# Patient Record
Sex: Male | Born: 1985 | Race: White
Health system: Midwestern US, Academic
[De-identification: ages and names within clinical notes are randomized; demographics above are authoritative.]

## PROBLEM LIST (undated history)

## (undated) DIAGNOSIS — F4310 Post-traumatic stress disorder, unspecified: Secondary | ICD-10-CM

## (undated) DIAGNOSIS — M25561 Pain in right knee: Secondary | ICD-10-CM

## (undated) DIAGNOSIS — F410 Panic disorder [episodic paroxysmal anxiety] without agoraphobia: Secondary | ICD-10-CM

## (undated) DIAGNOSIS — F329 Major depressive disorder, single episode, unspecified: Secondary | ICD-10-CM

## (undated) DIAGNOSIS — G43909 Migraine, unspecified, not intractable, without status migrainosus: Secondary | ICD-10-CM

## (undated) DIAGNOSIS — F32A Depression, unspecified: Secondary | ICD-10-CM

## (undated) DIAGNOSIS — S069XAA Unspecified intracranial injury with loss of consciousness status unknown, initial encounter: Secondary | ICD-10-CM

## (undated) DIAGNOSIS — F419 Anxiety disorder, unspecified: Secondary | ICD-10-CM

## (undated) DIAGNOSIS — M549 Dorsalgia, unspecified: Secondary | ICD-10-CM

## (undated) DIAGNOSIS — S069X9A Unspecified intracranial injury with loss of consciousness of unspecified duration, initial encounter: Secondary | ICD-10-CM

## (undated) HISTORY — DX: Post-traumatic stress disorder, unspecified: F43.10

## (undated) HISTORY — DX: Unspecified intracranial injury with loss of consciousness status unknown, initial encounter: S06.9XAA

## (undated) HISTORY — DX: Panic disorder (episodic paroxysmal anxiety): F41.0

## (undated) HISTORY — DX: Dorsalgia, unspecified: M54.9

## (undated) HISTORY — DX: Major depressive disorder, single episode, unspecified: F32.9

## (undated) HISTORY — DX: Migraine, unspecified, not intractable, without status migrainosus: G43.909

## (undated) HISTORY — PX: OTHER SURGICAL HISTORY: SHX169

## (undated) HISTORY — DX: Depression, unspecified: F32.A

## (undated) HISTORY — DX: Pain in right knee: M25.561

## (undated) HISTORY — DX: Unspecified intracranial injury with loss of consciousness of unspecified duration, initial encounter: S06.9X9A

## (undated) HISTORY — DX: Anxiety disorder, unspecified: F41.9

## (undated) MED FILL — ATOGEPANT 60 MG TABLET: 60 60 mg | ORAL | 30 days supply | Qty: 30 | Fill #1

---

## 2013-06-30 ENCOUNTER — Ambulatory Visit (INDEPENDENT_AMBULATORY_CARE_PROVIDER_SITE_OTHER): Payer: Non-veteran care | Admitting: Neurology

## 2013-06-30 ENCOUNTER — Encounter: Payer: Self-pay | Admitting: Neurology

## 2013-06-30 ENCOUNTER — Encounter (INDEPENDENT_AMBULATORY_CARE_PROVIDER_SITE_OTHER): Payer: Self-pay

## 2013-06-30 VITALS — BP 144/84 | HR 96 | Ht 76.5 in | Wt 218.0 lb

## 2013-06-30 DIAGNOSIS — G43709 Chronic migraine without aura, not intractable, without status migrainosus: Secondary | ICD-10-CM | POA: Insufficient documentation

## 2013-06-30 DIAGNOSIS — S069X9A Unspecified intracranial injury with loss of consciousness of unspecified duration, initial encounter: Secondary | ICD-10-CM

## 2013-06-30 DIAGNOSIS — R2681 Unsteadiness on feet: Secondary | ICD-10-CM

## 2013-06-30 DIAGNOSIS — R269 Unspecified abnormalities of gait and mobility: Secondary | ICD-10-CM

## 2013-06-30 DIAGNOSIS — G43009 Migraine without aura, not intractable, without status migrainosus: Secondary | ICD-10-CM

## 2013-06-30 DIAGNOSIS — S069XAA Unspecified intracranial injury with loss of consciousness status unknown, initial encounter: Secondary | ICD-10-CM

## 2013-06-30 MED ORDER — VERAPAMIL HCL ER 240 MG PO TBCR: 240.0000 mg | EXTENDED_RELEASE_TABLET | Freq: Every day | ORAL | Status: DC

## 2013-06-30 NOTE — Patient Instructions (Signed)
Overall you are doing fairly well but I do want to suggest a few things today:   Remember to drink plenty of fluid, eat healthy meals and do not skip any meals. Try to eat protein with a every meal and eat a healthy snack such as fruit or nuts in between meals. Try to keep a regular sleep-wake schedule and try to exercise daily, particularly in the form of walking, 20-30 minutes a day, if you can.   As far as your medications are concerned, I would like to suggest trying Botox therapy for your migraines. I will place a request for this and we will contact you once it is approved.   I would like you to see neuro-rehab and undergo vestibular rehab. You will be called to schedule this.   Please call us with any interim questions, concerns, problems, updates or refill requests.   My clinical assistant and will answer any of your questions and relay your messages to me and also relay most of my messages to you.   Our phone number is [phone]. We also have an after hours call service for urgent matters and there is a physician on-call for urgent questions. For any emergencies you know to call 911 or go to the nearest emergency room
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
## 2013-07-09 ENCOUNTER — Telehealth: Payer: Self-pay | Admitting: Neurology

## 2013-07-09 ENCOUNTER — Ambulatory Visit: Payer: Non-veteran care | Admitting: Physical Therapy

## 2013-07-09 NOTE — Telephone Encounter (Signed)
Spoke to patient and relayed that his referral for Vestibular rehab has to go to [facility] first.  They will either contact us or contact the patient for approval of the vestibular rehabilitation.

## 2013-08-08 ENCOUNTER — Emergency Department: Payer: Self-pay | Admitting: Emergency Medicine

## 2013-08-12 ENCOUNTER — Ambulatory Visit (INDEPENDENT_AMBULATORY_CARE_PROVIDER_SITE_OTHER): Payer: Non-veteran care | Admitting: Neurology

## 2013-08-12 ENCOUNTER — Encounter: Payer: Self-pay | Admitting: *Deleted

## 2013-08-12 ENCOUNTER — Encounter: Payer: Self-pay | Admitting: Neurology

## 2013-08-12 VITALS — BP 143/87 | HR 80 | Ht 76.5 in | Wt 215.0 lb

## 2013-08-12 DIAGNOSIS — G43719 Chronic migraine without aura, intractable, without status migrainosus: Secondary | ICD-10-CM

## 2013-08-12 DIAGNOSIS — G43709 Chronic migraine without aura, not intractable, without status migrainosus: Secondary | ICD-10-CM

## 2013-08-12 DIAGNOSIS — IMO0002 Reserved for concepts with insufficient information to code with codable children: Secondary | ICD-10-CM

## 2013-08-12 MED ORDER — MAGNESIUM OXIDE 400 MG PO TABS: 400.0000 mg | ORAL_TABLET | Freq: Every day | ORAL | Status: DC

## 2013-08-12 MED ORDER — ONABOTULINUMTOXINA 100 UNITS IJ SOLR: 200.0000 [IU] | Freq: Once | INTRAMUSCULAR | Status: DC

## 2013-08-12 NOTE — Progress Notes (Signed)
[facility]   Provider:  [name] Referring Provider: No ref. provider found Primary Care Physician:  No primary provider on file.  CC:  Chronic migraine  HPI:  Ethan Sawyer is a 28 y.o. male here as an initial visit for Botox injections for chronic migraines. Reports continued headaches, has been severe. Has been given dilaudid, promethazine and then toradol.    Prior visit 06/2013: Ethan Sawyer is a 28 y.o. male [facility] for migraine evaluation. He reports having had Botox in the past but has not had it in several months. 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 Has not been able to have it done since being discharged. Reports he has had migraines for the past 3 to 4 years. Has a daily chronic migraine and >15 times a month will have severe migraine exacerbations. Describes a generalized headache, pounding type headache. + Nausea, emesis, photo and phonophobia. + Blurry vision. Headaches last 6 hours to all day. + Dizziness and light headed.    Contraindications and precautions discussed with patient. Aseptic procedure was observed and patient tolerated procedure. Procedure performed by Dr. Elspeth ChoPeter Damaso Laday.  The condition has existed for more than 6 months, and pt does not have a diagnosis of ALS, Myasthenia Gravis or Lambert-Eaton Syndrome.  Risks and benefits of injections discussed and pt agrees to proceed with the procedure.  Written consent obtained These injections are medically necessary. He receives good benefits from these injections. These injections do not cause sedations or hallucinations which the oral therapies may cause.  Indication/Diagnosis: chronic migraine  Type of toxin: Botox  Lot # [id number]  Expiration date:Oct 2017  Injection sites:  Muscle Site    L   R  Corrugator    5   5  Procerus         5  Frontalis    5x2   5x2   Temporalis    5x4   5x4    Occipitalis    5x3   5x3  Trapezius    5x3   5x3  Cervical paraspinals  5x2   5x2  History   Social History  . Marital Status: Married    Spouse Name: shauna    Number of Children: 3  . Years of Education: college   Occupational History  . [employer]    Social History Main Topics  . Smoking status: Current Some Day Smoker  . Smokeless tobacco: Not on file  . Alcohol Use: No  . Drug Use: No  . Sexual Activity: Not on file   Other Topics Concern  . Not on file   Social History Narrative   Married, 3 children   Right handed   College   3-4 cups daily    Family History  Problem Relation Age of Onset  . Breast cancer Mother     Past Medical History  Diagnosis Date  . Migraine   . Depression   . Anxiety   . Back pain   . Right knee pain   . Panic disorder   . PTSD (post-traumatic stress disorder)   . TBI (traumatic brain injury)     Past Surgical History  Procedure Laterality Date  . None      Current Outpatient Prescriptions  Medication Sig Dispense Refill  . verapamil (CALAN-SR) 240 MG CR tablet Take 1 tablet (240 mg total) by mouth at bedtime.  90 tablet  3   No current facility-administered medications for this visit.    Allergies as of 08/12/2013  . (No Known Allergies)    Vitals: BP 143/87  Pulse 80  Ht 6' 4.5" (1.943 m)  Wt 215 lb (97.523 kg)  BMI 25.83 kg/m2 Last Weight:  Wt Readings from Last 1 Encounters:  08/12/13 215 lb (97.523 kg)   Last Height:   Ht Readings from Last 1 Encounters:  08/12/13 6' 4.5" (1.943 m)      Assessment:  After physical and neurologic examination, review of laboratory studies, imaging, neurophysiology testing and pre-existing records, assessment will be reviewed on the problem list.  Plan:  Treatment plan and additional workup will be reviewed under Problem List.  Ethan Sawyer is a 28 y.o. male here for botox injections for  chronic migraine headache  1) Botox injections as detailed above. A total of 155 units was used. Please order 200 units for next visit 2) Tylenol or Motrin for injection site pain. 3) Medication guide dispensed. 4) Follow up for repeat injections in 3 months    Elspeth ChoPeter Shianne Zeiser, DO  [facility] [address] [place], [place] [id number]  Phone [phone] Fax [phone]

## 2013-08-12 NOTE — Patient Instructions (Signed)
Overall you are doing fairly well but I do want to suggest a few things today:   Remember to drink plenty of fluid, eat healthy meals and do not skip any meals. Try to eat protein with a every meal and eat a healthy snack such as fruit or nuts in between meals. Try to keep a regular sleep-wake schedule and try to exercise daily, particularly in the form of walking, 20-30 minutes a day, if you can.   As far as your medications are concerned, I would like to suggest you continue on the Verapamil and then add Magnesium 400mg  daily  We will repeat injections in 3 months, you will be called to schedule this. Please call us with any interim questions, concerns, problems, updates or refill requests.   My clinical assistant and will answer any of your questions and relay your messages to me and also relay most of my messages to you.   Our phone number is [phone]. We also have an after hours call service for urgent matters and there is a physician on-call for urgent questions. For any emergencies you know to call 911 or go to the nearest emergency room

## 2013-09-17 ENCOUNTER — Encounter (HOSPITAL_COMMUNITY): Payer: Self-pay | Admitting: Emergency Medicine

## 2013-09-17 DIAGNOSIS — F4310 Post-traumatic stress disorder, unspecified: Secondary | ICD-10-CM | POA: Insufficient documentation

## 2013-09-17 DIAGNOSIS — Z113 Encounter for screening for infections with a predominantly sexual mode of transmission: Secondary | ICD-10-CM | POA: Insufficient documentation

## 2013-09-17 DIAGNOSIS — Z87820 Personal history of traumatic brain injury: Secondary | ICD-10-CM | POA: Insufficient documentation

## 2013-09-17 DIAGNOSIS — G479 Sleep disorder, unspecified: Secondary | ICD-10-CM | POA: Insufficient documentation

## 2013-09-17 DIAGNOSIS — F411 Generalized anxiety disorder: Secondary | ICD-10-CM | POA: Insufficient documentation

## 2013-09-17 DIAGNOSIS — Z79899 Other long term (current) drug therapy: Secondary | ICD-10-CM | POA: Insufficient documentation

## 2013-09-17 DIAGNOSIS — T7421XA Adult sexual abuse, confirmed, initial encounter: Secondary | ICD-10-CM | POA: Insufficient documentation
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
## 2013-09-18 ENCOUNTER — Encounter: Payer: Self-pay | Admitting: Neurology

## 2013-09-18 ENCOUNTER — Emergency Department (HOSPITAL_COMMUNITY)
Admission: EM | Admit: 2013-09-18 | Discharge: 2013-09-18 | Disposition: A | Discharge status: Home / Self Care | Attending: Emergency Medicine | Admitting: Emergency Medicine

## 2013-09-18 DIAGNOSIS — IMO0002 Reserved for concepts with insufficient information to code with codable children: Secondary | ICD-10-CM

## 2013-09-18 DIAGNOSIS — F4310 Post-traumatic stress disorder, unspecified: Secondary | ICD-10-CM

## 2013-09-18 LAB — RPR

## 2013-09-18 LAB — HIV ANTIBODY (ROUTINE TESTING W REFLEX): HIV 1&2 Ab, 4th Generation: NONREACTIVE

## 2013-09-18 MED ORDER — PROMETHAZINE HCL 25 MG PO TABS
ORAL_TABLET | ORAL | Status: AC
  Filled 2013-09-18: qty 3

## 2013-09-18 MED ORDER — LEVONORGESTREL 0.75 MG PO TABS
ORAL_TABLET | ORAL | Status: AC
  Filled 2013-09-18: qty 2

## 2013-09-18 MED ORDER — LORAZEPAM 1 MG PO TABS
1.0000 mg | ORAL_TABLET | Freq: Once | ORAL | Status: AC
  Administered 2013-09-18: 1 mg via ORAL
  Filled 2013-09-18: qty 1

## 2013-09-18 MED ORDER — AZITHROMYCIN 1 G PO PACK
PACK | ORAL | Status: AC
  Administered 2013-09-18: 1 g
  Filled 2013-09-18: qty 1

## 2013-09-18 MED ORDER — LORAZEPAM 1 MG PO TABS: 1.0000 mg | ORAL_TABLET | Freq: Two times a day (BID) | ORAL | Status: DC | PRN

## 2013-09-18 MED ORDER — CEFIXIME 400 MG PO TABS
ORAL_TABLET | ORAL | Status: AC
  Administered 2013-09-18: 400 mg
  Filled 2013-09-18: qty 1

## 2013-09-18 MED ORDER — METRONIDAZOLE 500 MG PO TABS
ORAL_TABLET | ORAL | Status: AC
  Administered 2013-09-18: 2000 mg
  Filled 2013-09-18: qty 4

## 2013-09-18 NOTE — ED Notes (Signed)
[place]  [employer] notified at request of patient and wife.

## 2013-09-18 NOTE — ED Notes (Signed)
Sane RN in with patient

## 2013-09-18 NOTE — Discharge Instructions (Signed)
Sexual Assault or Rape °Sexual assault is any sexual activity that a person is forced, threatened, or coerced into participating in. It may or may not involve physical contact. You are being sexually abused if you are forced to have sexual contact of any kind. Sexual assault is called rape if penetration has occurred (vaginal, oral, or anal). Many times, sexual assaults are committed by a friend, relative, or associate. Sexual assault and rape are never the victim's fault.  °Sexual assault can result in various health problems for the person who was assaulted. Some of these problems include: °· Physical injuries in the genital area or other areas of the body. °· Risk of unwanted pregnancy. °· Risk of sexually transmitted infections (STIs). °· Psychological problems such as anxiety, depression, or posttraumatic stress disorder. °WHAT STEPS SHOULD BE TAKEN AFTER A SEXUAL ASSAULT? °If you have been sexually assaulted, you should take the following steps as soon as possible: °· Go to a safe area as quickly as possible and call your local emergency services (911 in [place] Get away from the area where you have been attacked.   °· Do not wash, shower, comb your hair, or clean any part of your body.   °· Do not change your clothes.   °· Do not remove or touch anything in the area where you were assaulted.   °· Go to an emergency room for a complete physical exam. Get the necessary tests to protect yourself from STIs or pregnancy. You may be treated for an STI even if no signs of one are present. Emergency contraceptive medicines are also available to help prevent pregnancy, if this is desired. You may need to be examined by a specially trained health care provider. °· Have the health care provider collect evidence during the exam, even if you are not sure if you will file a report with the police. °· Find out how to file the correct papers with the authorities. This is important for all assaults, even if they were committed  by a family member or friend. °· Find out where you can get additional help and support, such as a local rape crisis center. °· Follow up with your health care provider as directed.   °HOW CAN YOU REDUCE THE CHANCES OF SEXUAL ASSAULT? °Take the following steps to help reduce your chances of being sexually assaulted: °· Consider carrying mace or pepper spray for protection against an attacker.   °· Consider taking a self-defense course. °· Do not try to fight off an attacker if he or she has a gun or knife.   °· Be aware of your surroundings, what is happening around you, and who might be there.   °· Be assertive, trust your instincts, and walk with confidence and direction. °· Be careful not to drink too much alcohol or use other intoxicants. These can reduce your ability to fight off an assault. °· Always lock your doors and windows. Be sure to have high-quality locks for your home.   °· Do not let people enter your house if you do not know them.   °· Get a home security system that has a siren if you are able.   °· Protect the keys to your house and car. Do not lend them out. Do not put your name and address on them. If you lose them, get your locks changed.   °· Always lock your car and have your key ready to open the door before approaching the car.   °· Park in a well-lit and busy area. °· Plan your driving routes   so that you travel on well-lit and frequently used streets.  Keep your car serviced. Always have at least half a tank of gas in it.   Do not go into isolated areas alone. This includes open garages, empty buildings or offices, or [employer] laundry rooms.   Do not walk or jog alone, especially when it is dark.   Never hitchhike.   If your car breaks down, call the police for help on your cell phone and stay inside the car with your doors locked and windows up.   If you are being followed, go to a busy area and call for help.   If you are stopped by a police officer, especially one in  an unmarked police car, keep your door locked. Do not put your window down all the way. Ask the officer to show you identification first.   Be aware of "date rape drugs" that can be placed in a drink when you are not looking. These drugs can make you unable to fight off an assault. FOR MORE INFORMATION  Office on [employer], [place]. Department of Health and Human Services: SecretaryNews.cawww.womenshealth.gov/violence-against-women/types-of-violence/sexual-assault-and-abuse.html  National Sexual Assault Hotline: 1-800-656-HOPE 272 800 2092(4673)  National Domestic Violence Hotline: 1-800-799-SAFE 682 288 3083(7233) or www.thehotline.org Document Released: 03/17/2000 Document Revised: 11/20/2012 Document Reviewed: 08/21/2012 [facility] Patient Information 2015 [employer], [place]. This information is not intended to replace advice given to you by your health care provider. Make sure you discuss any questions you have with your health care provider.  Post-traumatic Stress You have post-traumatic stress disorder (PTSD). This condition causes many different symptoms including: emotional outbursts, anxiety, sleeping problems, social withdrawal, and drug abuse. PTSD often follows a particularly traumatic event such as war, or natural disasters like hurricanes, earthquakes, or floods. It can also be seen after personal traumas such as accidents, rape, or the death of someone you love. Symptoms may be delayed for days or even years. Emotional numbing and the inability to feel your emotions, may be the earliest sign. Periods of agitation, aggression, and inability to perform ordinary tasks are common with PTSD. Nightmares and daytime memories of the trauma often bring on uncontrolled symptoms. Sufferers typically startle easily and avoid reminders of the trauma. Panic attacks, feelings of extreme guilt, and blackouts are often reported. Treatment is very helpful, especially group therapy. Healing happens when emotional traumas are shared with others who  have a sympathetic ear. The [facility] have helped over 185,000 veterans with this problem. Medication is also very effective. The symptoms can become chronic and lifelong, so it is important to get help. Call your caregiver or a counselor who deals with this type of problem for further assistance. Document Released: 04/27/2004 Document Revised: 06/12/2011 Document Reviewed: 03/20/2005 [facility] Patient Information 2015 [place], [place]. This information is not intended to replace advice given to you by your health care provider. Make sure you discuss any questions you have with your health care provider.
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
## 2013-09-20 LAB — GC/CHLAMYDIA PROBE AMP
CT Probe RNA: NEGATIVE
GC Probe RNA: NEGATIVE

## 2013-10-28 NOTE — SANE Note (Signed)
ON Monday, September 22, 2013, AN EMAIL REFERRAL FOR COUNSELING SERVICES WAS SENT Ethan Sawyer AT [facility].
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
## 2013-11-11 ENCOUNTER — Ambulatory Visit: Admitting: Neurology

## 2013-11-18 ENCOUNTER — Telehealth: Payer: Self-pay | Admitting: Neurology
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
## 2013-11-18 NOTE — Telephone Encounter (Signed)
Called and spoke to patient he has appt with Dr.Sumner 11-24-2013.

## 2013-11-24 ENCOUNTER — Ambulatory Visit (INDEPENDENT_AMBULATORY_CARE_PROVIDER_SITE_OTHER): Admitting: Neurology

## 2013-11-24 ENCOUNTER — Encounter: Payer: Self-pay | Admitting: Neurology

## 2013-11-24 VITALS — BP 128/79 | HR 78 | Ht 76.5 in | Wt 214.0 lb

## 2013-11-24 DIAGNOSIS — Z5189 Encounter for other specified aftercare: Secondary | ICD-10-CM

## 2013-11-24 DIAGNOSIS — R2681 Unsteadiness on feet: Secondary | ICD-10-CM

## 2013-11-24 DIAGNOSIS — R269 Unspecified abnormalities of gait and mobility: Secondary | ICD-10-CM

## 2013-11-24 DIAGNOSIS — IMO0002 Reserved for concepts with insufficient information to code with codable children: Secondary | ICD-10-CM

## 2013-11-24 DIAGNOSIS — G43709 Chronic migraine without aura, not intractable, without status migrainosus: Secondary | ICD-10-CM

## 2013-11-24 DIAGNOSIS — S069X0D Unspecified intracranial injury without loss of consciousness, subsequent encounter: Secondary | ICD-10-CM

## 2013-11-24 MED ORDER — METHYLPREDNISOLONE (PAK) 4 MG PO TABS: ORAL_TABLET | ORAL | Status: DC

## 2013-11-24 NOTE — Patient Instructions (Addendum)
Overall you are doing fairly well but I do want to suggest a few things today:   Remember to drink plenty of fluid, eat healthy meals and do not skip any meals. Try to eat protein with a every meal and eat a healthy snack such as fruit or nuts in between meals. Try to keep a regular sleep-wake schedule and try to exercise daily, particularly in the form of walking, 20-30 minutes a day, if you can.   As far as your medications are concerned, I would like to suggest the following: 1)I would like you to try a medrol steroid taper pack  I would like you to remain out of work for 7 more days. Please call me on 8/31 to update me on your status.   Please schedule repeat Botox injections if desired.   Please call us with any interim questions, concerns, problems, updates or refill requests.   My clinical assistant and will answer any of your questions and relay your messages to me and also relay most of my messages to you.   Our phone number is [phone]. We also have an after hours call service for urgent matters and there is a physician on-call for urgent questions. For any emergencies you know to call 911 or go to the nearest emergency room
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
## 2013-12-01 ENCOUNTER — Telehealth: Payer: Self-pay | Admitting: Neurology

## 2013-12-01 NOTE — Telephone Encounter (Signed)
Spoke to patient and he relayed he is returning a call to doctor, from follow up appointment on 11-24-13.

## 2013-12-03 NOTE — Telephone Encounter (Signed)
Returned call. No answer. Message left for patient to call back.  

## 2013-12-11 ENCOUNTER — Telehealth: Payer: Self-pay | Admitting: Neurology

## 2013-12-11 NOTE — Telephone Encounter (Signed)
Received call from patient, he is continuing to have a refractory headache. He is scheduled for Botox injections later this month. Will have him come in for a depacon infusion. Will notify RN to call patient to schedule this.

## 2013-12-12 NOTE — Telephone Encounter (Signed)
Spoke to patient and he will call back to see if he can come in this afternoon.  I relayed we would like him in no later than 1500.

## 2013-12-25 ENCOUNTER — Ambulatory Visit: Admitting: Neurology

## 2013-12-25 ENCOUNTER — Ambulatory Visit (INDEPENDENT_AMBULATORY_CARE_PROVIDER_SITE_OTHER): Payer: Non-veteran care | Admitting: Neurology

## 2013-12-25 ENCOUNTER — Encounter (INDEPENDENT_AMBULATORY_CARE_PROVIDER_SITE_OTHER): Payer: Self-pay

## 2013-12-25 ENCOUNTER — Encounter: Payer: Self-pay | Admitting: Neurology

## 2013-12-25 VITALS — BP 130/76 | HR 92 | Ht 76.0 in | Wt 215.0 lb

## 2013-12-25 DIAGNOSIS — G43709 Chronic migraine without aura, not intractable, without status migrainosus: Secondary | ICD-10-CM

## 2013-12-25 DIAGNOSIS — G43719 Chronic migraine without aura, intractable, without status migrainosus: Secondary | ICD-10-CM

## 2013-12-25 DIAGNOSIS — IMO0002 Reserved for concepts with insufficient information to code with codable children: Secondary | ICD-10-CM

## 2013-12-25 MED ORDER — ONABOTULINUMTOXINA 100 UNITS IJ SOLR
200.0000 [IU] | Freq: Once | INTRAMUSCULAR | Status: DC
Start: 2013-12-25 — End: 2014-12-15

## 2013-12-25 MED ORDER — MECLIZINE HCL 12.5 MG PO TABS: 12.5000 mg | ORAL_TABLET | Freq: Three times a day (TID) | ORAL | Status: DC | PRN
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
## 2014-03-24 ENCOUNTER — Ambulatory Visit (INDEPENDENT_AMBULATORY_CARE_PROVIDER_SITE_OTHER): Payer: Non-veteran care | Admitting: Neurology

## 2014-03-24 ENCOUNTER — Encounter: Payer: Self-pay | Admitting: Neurology

## 2014-03-24 VITALS — BP 113/73 | HR 84 | Ht 76.5 in | Wt 207.2 lb

## 2014-03-24 DIAGNOSIS — G43719 Chronic migraine without aura, intractable, without status migrainosus: Secondary | ICD-10-CM | POA: Diagnosis not present

## 2014-03-24 DIAGNOSIS — G43711 Chronic migraine without aura, intractable, with status migrainosus: Secondary | ICD-10-CM
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
## 2014-04-29 ENCOUNTER — Ambulatory Visit (INDEPENDENT_AMBULATORY_CARE_PROVIDER_SITE_OTHER): Payer: Non-veteran care | Admitting: Neurology

## 2014-04-29 ENCOUNTER — Encounter: Payer: Self-pay | Admitting: Neurology

## 2014-04-29 VITALS — BP 140/85 | HR 101 | Ht 76.5 in | Wt 209.0 lb

## 2014-04-29 DIAGNOSIS — R569 Unspecified convulsions: Secondary | ICD-10-CM

## 2014-04-29 MED ORDER — LAMOTRIGINE 25 MG PO TABS: ORAL_TABLET | ORAL | Status: DC
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
## 2014-04-29 NOTE — Patient Instructions (Addendum)
Overall you are doing fairly well but I do want to suggest a few things today:   Remember to drink plenty of fluid, eat healthy meals and do not skip any meals. Try to eat protein with a every meal and eat a healthy snack such as fruit or nuts in between meals. Try to keep a regular sleep-wake schedule and try to exercise daily, particularly in the form of walking, 20-30 minutes a day, if you can.   As far as your medications are concerned, I would like to suggest:  First  2 weeks: 25mg  daily (1 pill)  Weeks 3 and 4: 50mg  daily (2 pills)  Weeks 5 and 6: 50mg  twice daily (2 pills twice daily)   As far as diagnostic testing: mri of the brain w/wo contrast, EEG, Discussed extended eeg at a later time  I would like to see you back in 4 weeks, sooner if we need to. Please call us with any interim questions, concerns, problems, updates or refill requests.   Please also call us for any test results so we can go over those with you on the phone.  My clinical assistant and will answer any of your questions and relay your messages to me and also relay most of my messages to you.   Our phone number is [phone]. We also have an after hours call service for urgent matters and there is a physician on-call for urgent questions. For any emergencies you know to call 911 or go to the nearest emergency room

## 2014-04-30 DIAGNOSIS — R569 Unspecified convulsions: Secondary | ICD-10-CM | POA: Insufficient documentation

## 2014-05-05 ENCOUNTER — Ambulatory Visit (INDEPENDENT_AMBULATORY_CARE_PROVIDER_SITE_OTHER): Payer: Non-veteran care | Admitting: Neurology

## 2014-05-05 ENCOUNTER — Telehealth: Payer: Self-pay | Admitting: *Deleted

## 2014-05-05 DIAGNOSIS — R569 Unspecified convulsions: Secondary | ICD-10-CM

## 2014-05-05 NOTE — Telephone Encounter (Signed)
Talked with patient about normal EEG results. Verbalized understanding.
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
## 2014-05-05 NOTE — Telephone Encounter (Signed)
-----   Message from Anson FretAntonia B Ahern, MD sent at 05/05/2014  3:21 PM EST ----- Please let patient know his EEG was normal. Thank you

## 2014-05-29 ENCOUNTER — Emergency Department (HOSPITAL_COMMUNITY)

## 2014-05-29 ENCOUNTER — Encounter (HOSPITAL_COMMUNITY): Payer: Self-pay | Admitting: *Deleted

## 2014-05-29 ENCOUNTER — Emergency Department (HOSPITAL_COMMUNITY)
Admission: EM | Admit: 2014-05-29 | Discharge: 2014-05-30 | Disposition: A | Discharge status: Home / Self Care | Attending: Emergency Medicine | Admitting: Emergency Medicine

## 2014-05-29 DIAGNOSIS — Z87820 Personal history of traumatic brain injury: Secondary | ICD-10-CM | POA: Insufficient documentation

## 2014-05-29 DIAGNOSIS — Z8739 Personal history of other diseases of the musculoskeletal system and connective tissue: Secondary | ICD-10-CM | POA: Diagnosis not present

## 2014-05-29 DIAGNOSIS — F4312 Post-traumatic stress disorder, chronic: Secondary | ICD-10-CM | POA: Diagnosis not present

## 2014-05-29 DIAGNOSIS — Z720 Tobacco use: Secondary | ICD-10-CM | POA: Insufficient documentation

## 2014-05-29 DIAGNOSIS — R51 Headache: Secondary | ICD-10-CM | POA: Diagnosis not present

## 2014-05-29 DIAGNOSIS — F410 Panic disorder [episodic paroxysmal anxiety] without agoraphobia: Secondary | ICD-10-CM | POA: Insufficient documentation

## 2014-05-29 DIAGNOSIS — F329 Major depressive disorder, single episode, unspecified: Secondary | ICD-10-CM | POA: Insufficient documentation

## 2014-05-29 DIAGNOSIS — Z79899 Other long term (current) drug therapy: Secondary | ICD-10-CM | POA: Diagnosis not present

## 2014-05-29 DIAGNOSIS — G43909 Migraine, unspecified, not intractable, without status migrainosus: Secondary | ICD-10-CM | POA: Diagnosis not present

## 2014-05-29 DIAGNOSIS — G43009 Migraine without aura, not intractable, without status migrainosus: Secondary | ICD-10-CM

## 2014-05-29 LAB — I-STAT CHEM 8, ED
BUN: 11 mg/dL (ref 6–23)
CREATININE: 0.9 mg/dL (ref 0.50–1.35)
Calcium, Ion: 1.15 mmol/L (ref 1.12–1.23)
Chloride: 104 mmol/L (ref 96–112)
Glucose, Bld: 86 mg/dL (ref 70–99)
HEMATOCRIT: 49 % (ref 39.0–52.0)
HEMOGLOBIN: 16.7 g/dL (ref 13.0–17.0)
POTASSIUM: 4 mmol/L (ref 3.5–5.1)
Sodium: 141 mmol/L (ref 135–145)
TCO2: 22 mmol/L (ref 0–100)

## 2014-05-29 MED ORDER — VALPROATE SODIUM 500 MG/5ML IV SOLN
500.0000 mg | Freq: Once | INTRAVENOUS | Status: AC
  Administered 2014-05-29: 500 mg via INTRAVENOUS
  Filled 2014-05-29: qty 5

## 2014-05-29 MED ORDER — SODIUM CHLORIDE 0.9 % IV BOLUS (SEPSIS)
1000.0000 mL | Freq: Once | INTRAVENOUS | Status: AC
  Administered 2014-05-29: 1000 mL via INTRAVENOUS

## 2014-05-29 MED ORDER — HYDROMORPHONE HCL 1 MG/ML IJ SOLN
1.0000 mg | Freq: Once | INTRAMUSCULAR | Status: AC
  Administered 2014-05-29: 1 mg via INTRAVENOUS
  Filled 2014-05-29: qty 1

## 2014-05-29 MED ORDER — DIPHENHYDRAMINE HCL 50 MG/ML IJ SOLN
25.0000 mg | Freq: Once | INTRAMUSCULAR | Status: AC
  Administered 2014-05-29: 25 mg via INTRAVENOUS
  Filled 2014-05-29: qty 1

## 2014-05-29 MED ORDER — KETOROLAC TROMETHAMINE 30 MG/ML IJ SOLN
30.0000 mg | Freq: Once | INTRAMUSCULAR | Status: AC
  Administered 2014-05-29: 30 mg via INTRAVENOUS
  Filled 2014-05-29: qty 1

## 2014-05-29 MED ORDER — MAGNESIUM SULFATE 2 GM/50ML IV SOLN
2.0000 g | Freq: Once | INTRAVENOUS | Status: AC
  Administered 2014-05-29: 2 g via INTRAVENOUS
  Filled 2014-05-29: qty 50

## 2014-05-29 MED ORDER — FENTANYL CITRATE 0.05 MG/ML IJ SOLN
50.0000 ug | Freq: Once | INTRAMUSCULAR | Status: AC
  Administered 2014-05-29: 50 ug via INTRAVENOUS
  Filled 2014-05-29: qty 2

## 2014-05-29 MED ORDER — ACETAMINOPHEN 500 MG PO TABS
1000.0000 mg | ORAL_TABLET | Freq: Once | ORAL | Status: AC
  Administered 2014-05-29: 1000 mg via ORAL
  Filled 2014-05-29: qty 2

## 2014-05-29 MED ORDER — VALPROATE SODIUM 500 MG/5ML IV SOLN: 500.0000 mg | Freq: Once | INTRAVENOUS | Status: DC

## 2014-05-29 MED ORDER — METOCLOPRAMIDE HCL 5 MG/ML IJ SOLN
10.0000 mg | Freq: Once | INTRAMUSCULAR | Status: AC
  Administered 2014-05-29: 10 mg via INTRAVENOUS
  Filled 2014-05-29: qty 2

## 2014-05-29 MED ORDER — DEXAMETHASONE SODIUM PHOSPHATE 10 MG/ML IJ SOLN
10.0000 mg | Freq: Once | INTRAMUSCULAR | Status: AC
  Administered 2014-05-29: 10 mg via INTRAVENOUS
  Filled 2014-05-29: qty 1

## 2014-05-29 NOTE — ED Provider Notes (Signed)
CSN: [id number]     Arrival date & time 05/29/14  1554 History   First MD Initiated Contact with Patient 05/29/14 1615     Chief Complaint  Patient presents with  . 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 The patient states he was last normal at approximately 4 AM this morning when he went to sleep. He awoke and had a dull 5 on a 10 right-sided headache with associated tingling sensation as well as subjective decreased sensation in his right arm and leg. The headache has persisted and gradually worsened and is now 8 out of 10 in severity. His numbness has persisted. He denies any associated dysarthria, dysphasia or difficulty swallowing. He denies any associated right-sided weakness. He denies any recent head trauma and does not believe he had a seizure. Of note, the patient states his headache is slightly different from his usual migraine and his never had right-sided numbness with his prior headaches in the past. He denies any recent medication changes and has been taking his Lamictal as prescribed. No recent fevers or chills. No photophobia. Aggravating factors include bright light and loud noises. He denies any alleviating factors.  Past Medical History  Diagnosis Date  . Migraine   . Depression   . Anxiety   . Back pain   . Right knee pain   . Panic disorder   . PTSD (post-traumatic stress disorder)   . TBI (traumatic brain injury)    Past Surgical History  Procedure Laterality Date  . None     Family History  Problem Relation Age of Onset  . Breast cancer Mother    History  Substance Use Topics  . Smoking status: Current Every Day Smoker -- 0.75 packs/day    Types:  Cigarettes  . Smokeless tobacco: Never Used  . Alcohol Use: No     Comment: Quit : 98yrs ago    Review of Systems  Constitutional: Negative for fever and chills.  HENT: Negative for congestion, rhinorrhea and sore throat.   Eyes: Negative for visual disturbance.  Respiratory: Negative for cough, shortness of breath and wheezing.   Cardiovascular: Negative for chest pain.  Gastrointestinal: Negative for nausea, vomiting, abdominal pain and diarrhea.  Musculoskeletal: Negative for gait problem and neck pain.  Skin: Negative for rash.  Neurological: Positive for numbness and headaches. Negative for dizziness, seizures, syncope and speech difficulty.      Allergies  Review of patient's allergies indicates no known allergies.  Home Medications   Prior to Admission medications   Medication Sig Start Date End Date Taking? Authorizing Provider  clonazePAM (KLONOPIN) 1 MG tablet Take 1 mg by mouth 2 (two) times daily as needed for anxiety.    Yes Historical Provider, MD  lamoTRIgine (LAMICTAL) 25 MG tablet First  2 weeks:  daily (1 pill); Weeks 3 and 4:  daily (2 pills); Weeks 5 and 6:  twice daily (2 pills twice daily) 04/29/14  Yes Anson Fret, MD  magnesium oxide (MAG-OX) 400 MG tablet Take 1 tablet (400 mg total) by mouth daily. 08/12/13  Yes Omelia Blackwater, DO  meclizine (ANTIVERT) 12.5 MG tablet Take 1 tablet (12.5 mg total) by mouth 3 (three) times daily as needed for dizziness. 12/25/13  Yes Omelia Blackwater,  DO  pantoprazole (PROTONIX) 40 MG tablet Take 40 mg by mouth daily.   Yes Historical Provider, MD  promethazine (PHENERGAN) 25 MG tablet Take 25 mg by mouth every 6 (six) hours as needed for nausea or vomiting.   Yes Historical Provider, MD  sertraline (ZOLOFT) 100 MG tablet Take 200 mg by mouth daily.    Yes Historical Provider, MD  SUMAtriptan (IMITREX) 50 MG tablet Take 50 mg by mouth every 2 (two) hours as needed for migraine or headache. May repeat in 2  hours if headache persists or recurs.   Yes Historical Provider, MD  tiZANidine (ZANAFLEX) 4 MG tablet Take 4 mg by mouth 3 (three) times daily. Taking 3 times a day as needed   Yes Historical Provider, MD  verapamil (CALAN-SR) 240 MG CR tablet Take 1 tablet (240 mg total) by mouth at bedtime. Patient taking differently: Take 240 mg by mouth daily.  06/30/13  Yes Omelia BlackwaterPeter Justin Sumner, DO   BP 129/68 mmHg  Pulse 78  Temp(Src) 98.3 F (36.8 C) (Oral)  Resp 24  Ht 6\' 4"  (1.93 m)  Wt 215 lb (97.523 kg)  BMI 26.18 kg/m2  SpO2 96% Physical Exam  Constitutional: He appears well-developed and well-nourished. No distress.  HENT:  Head: Normocephalic and atraumatic.  Mouth/Throat: No oropharyngeal exudate.  Eyes: Conjunctivae are normal. Pupils are equal, round, and reactive to light.  Neck: Normal range of motion. Neck supple.  Cardiovascular: Normal rate, normal heart sounds and intact distal pulses.  Exam reveals no friction rub.   No murmur heard. Pulmonary/Chest: Effort normal and breath sounds normal. No respiratory distress. He has no wheezes.  Abdominal: Soft. Bowel sounds are normal. He exhibits no distension. There is no tenderness.  Musculoskeletal: He exhibits no edema.  Skin: Skin is warm. No rash noted.  Nursing note and vitals reviewed.   Neurological Exam:  - Mental Status: Alert and oriented to person, place, and time. Attention and concentration normal. Speech clear. Recent memory is intact. - Cranial Nerves: Visual fields intact to confrontation in all quadrants bilaterally. EOMI and PERRLA. No nystagmus noted. Facial sensation intact at forehead, maxillary cheek, and chin/mandible bilaterally. No weakness of masticatory muscles. No facial asymmetry or weakness. Hearing grossly normal to finer rub. Uvula is midline, and palate elevates symmetrically. Normal SCM and trapezius strength. Tongue midline without fasciculations - Motor: Muscle strength 5/5 in proximal and distal UE  and LE bilaterally. No pronator drift. Muscle tone normal. - Reflexes: 2+ and symmetrical in all four extremities.  - Sensation: Subjectively diminished to light touch and pinrick in right upper and lower extremities distally. - Gait: Normal without ataxia. - Coordination: Normal FTN and HTS bilaterally.  ED Course  Procedures (including critical care time) Labs Review Labs Reviewed  I-STAT CHEM 8, ED    Imaging Review Mr Brain Wo Contrast  05/29/2014   CLINICAL DATA:  Headache. Right-sided numbness and tingling beginning this morning at 4 a.m.  EXAM: MRI HEAD WITHOUT CONTRAST  TECHNIQUE: Multiplanar, multiecho pulse sequences of the brain and surrounding structures were obtained without intravenous contrast.  COMPARISON:  CT head without contrast 08/08/2013.  FINDINGS: Diffusion-weighted images demonstrate no evidence for acute or subacute infarction. No hemorrhage or mass lesion is evident. Ventricles are of normal size. No significant extraaxial fluid collection is present.  Flow is present in the major intracranial arteries. The globes and orbits are intact. The skullbase is within normal limits. Midline structures are unremarkable.  Dedicated imaging of the temporal lobes  demonstrate symmetric size and signal of the hippocampal structures.  Mild mucosal thickening is scattered in the anterior ethmoid air cells bilaterally. There is mild mucosal thickening in the right frontal sinus and bilateral maxillary sinuses. The mastoid air cells are clear.  IMPRESSION: 1. Normal MRI appearance of the brain. 2. Mild sinus disease as described. 3. No acute or focal lesion to explain the patient's symptoms.   Electronically Signed   By: Marin Roberts M.D.   On: 05/29/2014 18:12     EKG Interpretation None      MDM   Final diagnoses:  Atypical migraine    29 year old male with past medical history of TBI and reported "spiral, seizure disorder," as well as chronic daily atypical migraines  who presents with right-sided headache as well as subjectively decreased sensation of the right upper and lower extremities. The headache began gradually and has progressively worsened. See history of present illness above. On arrival, temp 98.2, vital signs stable and within normal limits. Exam as above. Remarkable for subjectively decreased sensation of the right upper and lower extremity, but over otherwise no focal neurological deficits.  Patient's presentation is most consistent with likely atypical complex migraine. However, the patient states that while the headache is similar to his usual migraines. The associated numbness is new. Discussed with neurology recommends MRI. Will treat with migraine cocktail and IV fluids. Otherwise, patient is afebrile with no neck stiffness, recent illnesses, or red flags for meningitis or encephalitis. The pain began gradually, was not merely severe at onset, and is similar to his usual migraines and I do not suspect subarachnoid hemorrhage. No recent seizure activity per his or his wife's report. No recent head trauma. Will follow up MRI and reassess.  MRI shows no acute abnormalities. I formally consult to neurology and they've evaluated the patient. They suspect this is secondary to atypical migraine with aura and recommend management of headache with discharge once improved. Chem 8 panel unremarkable. Patient states headache is mildly improved after IV fluids, Reglan, Benadryl, Toradol, and fentanyl. He states that Dilaudid is usually all that works for his headaches. Given the persistence of his headache as well as his history of refractory migraines, will give Depacon as well as Dilaudid and continued additional fluid bolus.  Headache is now 3-4/10 in severity. He states this is his baseline severity and his right-sided numbness has resolved. Will subsequently discharge with outpatient follow-up  Clinical Impression: 1. Atypical migraine     Disposition:  Admit  Condition: Stable  Pt seen in conjunction with Dr. Bethann Punches, MD 05/30/14 1610  Juliet Rude. Rubin Payor, MD 05/30/14 1504

## 2014-05-29 NOTE — ED Notes (Signed)
Pt in from home c/o HA & R sided numbness & tingling new onset, LSN this am @ 4am, pt reports falling asleep & waking up with symptoms @ 13:00, pt hx of TBI in 2011 x 4, pt sees Dr. Hosie PoissonSumner @ [facility], pt [facility] [place], pt reports hx of spiral seizures takes Lamictal, pt A&O x4, upon arrival to ED, pt reports bil arm numbness, pt c/o chronic HA, pt c/o light sensitivity

## 2014-05-29 NOTE — ED Notes (Signed)
Patient transported to MRI 

## 2014-05-29 NOTE — ED Notes (Signed)
O2 saturation drops to 88 when pt asleep, pt placed on 2L [place] O2 saturation 94%.

## 2014-05-29 NOTE — Consult Note (Signed)
NEURO HOSPITALIST CONSULT NOTE    Reason for Consult: HA with new onset right arm-leg paresthesias  HPI:                                                                                                                                          Ethan Sawyer is an 29 y.o. male with a past medical history significant for TBI x 5 with subsequent development of migraine, depression, anxiety, PTSD, and seizures on lamictal,  comes in for further evaluation of the above stated symptoms. He indicated that he gets very frequent migraines but never had a migraine accompanied by any other symptoms. However, he woke up this morning with a severe HA and immediately started having numbness-tingling and discomfort of the right arm and leg. Denies associated vertigo, double vision, focal weakness, imbalance, slurred speech, language or vision impairment. No bladder or bowel impairment. No recent fever, infection, vaccination, foreign travel, head/neck trauma. I personally reviewed MRI brain done today and it showed no acute intracranial abnormality or other lesion that could explain his symptoms. Received IV fluids, Ketorolac, Depacon, Reglan, Benadryl, decadron, and magnesium without significant HA improvement.  Past Medical History  Diagnosis Date  . Migraine   . Depression   . Anxiety   . Back pain   . Right knee pain   . Panic disorder   . PTSD (post-traumatic stress disorder)   . TBI (traumatic brain injury)     Past Surgical History  Procedure Laterality Date  . None      Family History  Problem Relation Age of Onset  . Breast cancer Mother     Family History: no MS, brain tumor, epilepsy, or brain aneurysms.   Social History:  reports that he has been smoking Cigarettes.  He has been smoking about 0.75 packs per day. He has never used smokeless tobacco. He reports that he does not drink alcohol or use illicit drugs.  No Known Allergies  MEDICATIONS:                                                                                                                      I have reviewed the patient's current medications.   ROS:  History obtained from the patient  General ROS: negative for - chills, fatigue, fever, night sweats, weight gain or weight loss Psychological ROS: negative for - behavioral disorder, hallucinations, memory difficulties Ophthalmic ROS: negative for - blurry vision, double vision, eye pain or loss of vision ENT ROS: negative for - epistaxis, nasal discharge, oral lesions, sore throat, tinnitus or vertigo Allergy and Immunology ROS: negative for - hives or itchy/watery eyes Hematological and Lymphatic ROS: negative for - bleeding problems, bruising or swollen lymph nodes Endocrine ROS: negative for - galactorrhea, hair pattern changes, polydipsia/polyuria or temperature intolerance Respiratory ROS: negative for - cough, hemoptysis, shortness of breath or wheezing Cardiovascular ROS: negative for - chest pain, dyspnea on exertion, edema or irregular heartbeat Gastrointestinal ROS: negative for - abdominal pain, diarrhea, hematemesis, nausea/vomiting or stool incontinence Genito-Urinary ROS: negative for - dysuria, hematuria, incontinence or urinary frequency/urgency Musculoskeletal ROS: negative for - joint swelling or muscular weakness Neurological ROS: as noted in HPI Dermatological ROS: negative for rash and skin lesion changes  Physical exam: pleasant male in no apparent distress. Blood pressure 112/57, pulse 54, temperature 98.3 F (36.8 C), temperature source Oral, resp. rate 14, height 6\' 4"  (1.93 m), weight 97.523 kg (215 lb), SpO2 95 %. Head: normocephalic. Neck: supple, no bruits, no JVD. Cardiac: no murmurs. Lungs: clear. Abdomen: soft, no tender, no mass. Extremities: no  edema. Skin: no rash Neurologic Examination:                                                                                                      General: Mental Status: Alert, oriented, thought content appropriate.  Speech fluent without evidence of aphasia.  Able to follow 3 step commands without difficulty. Cranial Nerves: II: Discs flat bilaterally; Visual fields grossly normal, pupils equal, round, reactive to light and accommodation III,IV, VI: ptosis not present, extra-ocular motions intact bilaterally V,VII: smile symmetric, facial light touch sensation normal bilaterally VIII: hearing normal bilaterally IX,X: gag reflex present XI: bilateral shoulder shrug XII: midline tongue extension without atrophy or fasciculations  Motor: Right : Upper extremity   5/5    Left:     Upper extremity   5/5  Lower extremity   5/5     Lower extremity   5/5 Tone and bulk:normal tone throughout; no atrophy noted Sensory: Pinprick and light touch slightly diminished in the right arm-leg Deep Tendon Reflexes:  Right: Upper Extremity   Left: Upper extremity   biceps (C-5 to C-6) 2/4   biceps (C-5 to C-6) 2/4 tricep (C7) 2/4    triceps (C7) 2/4 Brachioradialis (C6) 2/4  Brachioradialis (C6) 2/4  Lower Extremity Lower Extremity  quadriceps (L-2 to L-4) 2/4   quadriceps (L-2 to L-4) 2/4 Achilles (S1) 2/4   Achilles (S1) 2/4  Plantars: Right: downgoing   Left: downgoing Cerebellar: normal finger-to-nose,  normal heel-to-shin test Gait:  Deferred due to multiple leads.    No results found for: CHOL  Results for orders placed or performed during the hospital encounter of 05/29/14 (from the past 48 hour(s))  I-Stat Chem 8, ED  Status: None   Collection Time: 05/29/14  8:09 PM  Result Value Ref Range   Sodium 141 135 - 145 mmol/L   Potassium 4.0 3.5 - 5.1 mmol/L   Chloride 104 96 - 112 mmol/L   BUN 11 6 - 23 mg/dL   Creatinine, Ser 0.45 0.50 - 1.35 mg/dL   Glucose, Bld 86 70 - 99  mg/dL   Calcium, Ion 4.09 8.11 - 1.23 mmol/L   TCO2 22 0 - 100 mmol/L   Hemoglobin 16.7 13.0 - 17.0 g/dL   HCT 91.4 78.2 - 95.6 %    Mr Brain Wo Contrast  05/29/2014   CLINICAL DATA:  Headache. Right-sided numbness and tingling beginning this morning at 4 a.m.  EXAM: MRI HEAD WITHOUT CONTRAST  TECHNIQUE: Multiplanar, multiecho pulse sequences of the brain and surrounding structures were obtained without intravenous contrast.  COMPARISON:  CT head without contrast 08/08/2013.  FINDINGS: Diffusion-weighted images demonstrate no evidence for acute or subacute infarction. No hemorrhage or mass lesion is evident. Ventricles are of normal size. No significant extraaxial fluid collection is present.  Flow is present in the major intracranial arteries. The globes and orbits are intact. The skullbase is within normal limits. Midline structures are unremarkable.  Dedicated imaging of the temporal lobes demonstrate symmetric size and signal of the hippocampal structures.  Mild mucosal thickening is scattered in the anterior ethmoid air cells bilaterally. There is mild mucosal thickening in the right frontal sinus and bilateral maxillary sinuses. The mastoid air cells are clear.  IMPRESSION: 1. Normal MRI appearance of the brain. 2. Mild sinus disease as described. 3. No acute or focal lesion to explain the patient's symptoms.   Electronically Signed   By: Marin Roberts M.D.   On: 05/29/2014 18:12   Assessment/Plan: 29 y/o with PMH significant for TBI x 5 with subsequent development of migraine, depression, anxiety, PTSD, and seizures on lamictal,  comes in with complains of HA with right arm and leg paresthesias. Except for slightly diminished right arm-leg sensation, no other findings on exam. MRI brain without contrast unremarkable. I am wonder if this is a migraine with aura, as MRI normal and can not see objective findings on exam to suggest a cord process, radiculopathy, or neuropathy. Will not  suggest further inpatient neurology work up at this moment but certainly may need further outpatient neurological evaluation. Migraine management. Will sign off  Wyatt Portela, MD 05/29/2014, 8:38 PM  [facility]

## 2014-05-30 MED ORDER — FENTANYL CITRATE 0.05 MG/ML IJ SOLN
50.0000 ug | Freq: Once | INTRAMUSCULAR | Status: AC
  Administered 2014-05-30: 50 ug via INTRAVENOUS
  Filled 2014-05-30: qty 2

## 2014-05-30 NOTE — Discharge Instructions (Signed)

## 2014-07-13 ENCOUNTER — Ambulatory Visit (INDEPENDENT_AMBULATORY_CARE_PROVIDER_SITE_OTHER): Payer: Non-veteran care | Admitting: Neurology

## 2014-07-13 ENCOUNTER — Encounter: Payer: Self-pay | Admitting: Neurology

## 2014-07-13 VITALS — BP 122/74 | HR 110 | Temp 98.3°F | Ht 76.0 in | Wt 200.5 lb

## 2014-07-13 DIAGNOSIS — R110 Nausea: Secondary | ICD-10-CM

## 2014-07-13 DIAGNOSIS — R51 Headache: Secondary | ICD-10-CM

## 2014-07-13 DIAGNOSIS — R519 Headache, unspecified: Secondary | ICD-10-CM

## 2014-07-13 DIAGNOSIS — G3184 Mild cognitive impairment, so stated: Secondary | ICD-10-CM | POA: Diagnosis not present

## 2014-07-13 MED ORDER — MECLIZINE HCL 12.5 MG PO TABS: 12.5000 mg | ORAL_TABLET | Freq: Three times a day (TID) | ORAL | Status: DC | PRN

## 2014-07-13 MED ORDER — LAMOTRIGINE ER 200 MG PO TB24: 200.0000 mg | ORAL_TABLET | Freq: Every day | ORAL | Status: DC

## 2014-07-13 MED ORDER — ONDANSETRON 4 MG PO TBDP: 4.0000 mg | ORAL_TABLET | Freq: Three times a day (TID) | ORAL | Status: DC | PRN

## 2014-07-13 NOTE — Patient Instructions (Signed)
Overall you are doing fairly well but I do want to suggest a few things today:   Remember to drink plenty of fluid, eat healthy meals and do not skip any meals. Try to eat protein with a every meal and eat a healthy snack such as fruit or nuts in between meals. Try to keep a regular sleep-wake schedule and try to exercise daily, particularly in the form of walking, 20-30 minutes a day, if you can.   As far as your medications are concerned, I would like to suggest: Increase Lamictal to 200mg  at bedtime  As far as your medications are concerned, I would like to suggest: 1. Daily preventative medications b. Riboflavin 400 mg/day (Vitamin B2) (every day) c. Magnesium 400-600 mg (trigmagnesium dicitrate) daily (every day)  I would like to see you back in 3 months, sooner if we need to. Please call us with any interim questions, concerns, problems, updates or refill requests.   Please also call us for any test results so we can go over those with you on the phone.  My clinical assistant and will answer any of your questions and relay your messages to me and also relay most of my messages to you.   Our phone number is [phone]. We also have an after hours call service for urgent matters and there is a physician on-call for urgent questions. For any emergencies you know to call 911 or go to the nearest emergency room
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
## 2014-07-14 ENCOUNTER — Other Ambulatory Visit: Payer: Self-pay | Admitting: Neurology

## 2014-07-18 DIAGNOSIS — G3184 Mild cognitive impairment, so stated: Secondary | ICD-10-CM | POA: Insufficient documentation

## 2014-07-18 DIAGNOSIS — R51 Headache: Secondary | ICD-10-CM

## 2014-07-18 DIAGNOSIS — R519 Headache, unspecified: Secondary | ICD-10-CM | POA: Insufficient documentation

## 2014-09-01 ENCOUNTER — Encounter: Payer: Self-pay | Admitting: Neurology

## 2014-09-01 ENCOUNTER — Ambulatory Visit (INDEPENDENT_AMBULATORY_CARE_PROVIDER_SITE_OTHER): Payer: Non-veteran care | Admitting: Neurology

## 2014-09-01 VITALS — BP 129/86 | HR 79 | Ht 76.0 in | Wt 194.2 lb

## 2014-09-01 DIAGNOSIS — G44229 Chronic tension-type headache, not intractable: Secondary | ICD-10-CM | POA: Diagnosis not present

## 2014-09-01 DIAGNOSIS — R112 Nausea with vomiting, unspecified: Secondary | ICD-10-CM
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
## 2014-09-01 NOTE — Patient Instructions (Signed)
Overall you are doing fairly well but I do want to suggest a few things today:   Remember to drink plenty of fluid, eat healthy meals and do not skip any meals. Try to eat protein with a every meal and eat a healthy snack such as fruit or nuts in between meals. Try to keep a regular sleep-wake schedule and try to exercise daily, particularly in the form of walking, 20-30 minutes a day, if you can.   As far as your medications are concerned, I would like to suggest: continue current medications  As far as diagnostic testing: Suggest seeing primary  For nausea  I would like to see you back as needed, sooner if we need to. Please call us with any interim questions, concerns, problems, updates or refill requests.   Please also call us for any test results so we can go over those with you on the phone.  My clinical assistant and will answer any of your questions and relay your messages to me and also relay most of my messages to you.   Our phone number is [phone]. We also have an after hours call service for urgent matters and there is a physician on-call for urgent questions. For any emergencies you know to call 911 or go to the nearest emergency room

## 2014-12-01 ENCOUNTER — Telehealth: Payer: Self-pay | Admitting: *Deleted

## 2014-12-01 ENCOUNTER — Ambulatory Visit (INDEPENDENT_AMBULATORY_CARE_PROVIDER_SITE_OTHER): Payer: Self-pay | Admitting: Neurology

## 2014-12-01 DIAGNOSIS — G43009 Migraine without aura, not intractable, without status migrainosus: Secondary | ICD-10-CM

## 2014-12-01 NOTE — Telephone Encounter (Signed)
No showed f/u appt.  

## 2014-12-01 NOTE — Progress Notes (Signed)
No show

## 2014-12-02 ENCOUNTER — Encounter: Payer: Self-pay | Admitting: Neurology

## 2014-12-15 ENCOUNTER — Encounter: Payer: Self-pay | Admitting: Neurology

## 2014-12-15 ENCOUNTER — Ambulatory Visit (INDEPENDENT_AMBULATORY_CARE_PROVIDER_SITE_OTHER): Payer: Non-veteran care | Admitting: Neurology

## 2014-12-15 VITALS — BP 136/74 | HR 72 | Ht 76.0 in | Wt 195.8 lb

## 2014-12-15 DIAGNOSIS — R110 Nausea: Secondary | ICD-10-CM | POA: Diagnosis not present

## 2014-12-15 MED ORDER — LAMOTRIGINE ER 200 MG PO TB24: 200.0000 mg | ORAL_TABLET | Freq: Every day | ORAL | Status: DC

## 2014-12-15 MED ORDER — ONDANSETRON 4 MG PO TBDP: 4.0000 mg | ORAL_TABLET | Freq: Three times a day (TID) | ORAL | Status: DC | PRN

## 2014-12-15 MED ORDER — SUMATRIPTAN SUCCINATE 50 MG PO TABS: 50.0000 mg | ORAL_TABLET | Freq: Once | ORAL | Status: DC
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
## 2015-01-27 ENCOUNTER — Other Ambulatory Visit: Payer: Self-pay | Admitting: Neurology

## 2015-04-22 ENCOUNTER — Telehealth: Payer: Self-pay | Admitting: Neurology

## 2015-04-22 NOTE — Telephone Encounter (Signed)
Wife called back regarding husband being seen today, states she called a couple of hours ago and no one has called, she needs to know something.

## 2015-04-22 NOTE — Telephone Encounter (Signed)
Patient's wife is calling regarding the patient. The patient has had a migraine x 2 days with nausea and would like to be seen today. Please call.

## 2015-04-22 NOTE — Telephone Encounter (Signed)
Discussed with Dr Lucia Gaskins who stated patient may come in for migraine infusion. Inetta Fermo, infusion RN requested patient e here at 2 pm. Spoke with wife, Blake Divine and advised her to have her husband here at 2 pm for infusion and informed her that he cannot drive. She stated she would be driving, verbalized understanding, appreciation for call back.

## 2015-08-05 ENCOUNTER — Telehealth: Payer: Self-pay | Admitting: Neurology

## 2015-08-05 NOTE — Telephone Encounter (Signed)
Melanie/[employer] [phone] called to schedule appointment for patient, approved for 2 visits, Authorization# [id number], member ID# [id number], appointment scheduled Tuesday 09/14/15 9:00am advised patient needs to arrive 8:45am. Melanie advised, do not collect co-pay from patient.

## 2015-09-07 ENCOUNTER — Telehealth: Payer: Self-pay | Admitting: Neurology

## 2015-09-08 NOTE — Telephone Encounter (Signed)
Medical records sent ?

## 2015-09-14 ENCOUNTER — Ambulatory Visit (INDEPENDENT_AMBULATORY_CARE_PROVIDER_SITE_OTHER): Payer: Non-veteran care | Admitting: Neurology

## 2015-09-14 ENCOUNTER — Encounter: Payer: Self-pay | Admitting: Neurology

## 2015-09-14 VITALS — BP 130/70 | HR 89 | Ht 76.0 in | Wt 196.8 lb

## 2015-09-14 DIAGNOSIS — S0990XS Unspecified injury of head, sequela: Secondary | ICD-10-CM | POA: Insufficient documentation

## 2015-09-14 DIAGNOSIS — R42 Dizziness and giddiness: Secondary | ICD-10-CM | POA: Diagnosis not present

## 2015-09-14 DIAGNOSIS — R112 Nausea with vomiting, unspecified: Secondary | ICD-10-CM | POA: Diagnosis not present

## 2015-09-14 DIAGNOSIS — R110 Nausea: Secondary | ICD-10-CM | POA: Insufficient documentation

## 2015-09-14 DIAGNOSIS — G43001 Migraine without aura, not intractable, with status migrainosus: Secondary | ICD-10-CM

## 2015-09-14 NOTE — Patient Instructions (Signed)
Remember to drink plenty of fluid, eat healthy meals and do not skip any meals. Try to eat protein with a every meal and eat a healthy snack such as fruit or nuts in between meals. Try to keep a regular sleep-wake schedule and try to exercise daily, particularly in the form of walking, 20-30 minutes a day, if you can.   As far as your medications are concerned, I would like to suggest: Continue current medication  I would like to see you back in 6 months, sooner if we need to. Please call us with any interim questions, concerns, problems, updates or refill requests.   Our phone number is [phone]. We also have an after hours call service for urgent matters and there is a physician on-call for urgent questions. For any emergencies you know to call 911 or go to the nearest emergency room   
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
## 2015-09-16 ENCOUNTER — Telehealth: Payer: Self-pay | Admitting: *Deleted

## 2015-09-16 NOTE — Telephone Encounter (Signed)
Pt records faxed, to [facility] on 09/16/15.

## 2015-09-22 ENCOUNTER — Other Ambulatory Visit: Payer: Self-pay | Admitting: Neurology

## 2015-11-22 ENCOUNTER — Telehealth: Payer: Self-pay | Admitting: *Deleted

## 2015-11-22 NOTE — Telephone Encounter (Signed)
Called wife back. She stated that pt had reaction where he felt very restless and had to go from previous infusion. Advised per Inetta Fermoina in intrafusion that this was d/t compazine. I spelled for wife. Also advised that depacon, toradol and solumedrol usually given for infusion at our office. She verbalized understanding.

## 2015-11-22 NOTE — Telephone Encounter (Signed)
Pt wife called office. Stated pt has received IV infusion in our office before. She states he had a bad reaction to one of the meds and wanted to know what one it was. They are headed to ED out of town and want to know this prior to getting infusion at ED. Deatra JamesAdvised Tina with patient right now and I will check with her and call back as soon as I can to advise. She verbalized understanding.

## 2016-01-21 ENCOUNTER — Other Ambulatory Visit: Payer: Self-pay | Admitting: Neurology

## 2016-02-23 ENCOUNTER — Other Ambulatory Visit: Payer: Self-pay | Admitting: Neurology

## 2016-02-23 DIAGNOSIS — R110 Nausea: Secondary | ICD-10-CM

## 2016-03-02 ENCOUNTER — Other Ambulatory Visit: Payer: Self-pay | Admitting: Neurology

## 2016-03-12 ENCOUNTER — Emergency Department
Admission: EM | Admit: 2016-03-12 | Discharge: 2016-03-12 | Disposition: A | Discharge status: Home / Self Care | Attending: Emergency Medicine | Admitting: Emergency Medicine

## 2016-03-12 ENCOUNTER — Encounter: Payer: Self-pay | Admitting: Emergency Medicine

## 2016-03-12 ENCOUNTER — Emergency Department

## 2016-03-12 DIAGNOSIS — W010XXA Fall on same level from slipping, tripping and stumbling without subsequent striking against object, initial encounter: Secondary | ICD-10-CM | POA: Insufficient documentation

## 2016-03-12 DIAGNOSIS — Y929 Unspecified place or not applicable: Secondary | ICD-10-CM | POA: Diagnosis not present

## 2016-03-12 DIAGNOSIS — S52124A Nondisplaced fracture of head of right radius, initial encounter for closed fracture: Secondary | ICD-10-CM | POA: Insufficient documentation

## 2016-03-12 DIAGNOSIS — Y939 Activity, unspecified: Secondary | ICD-10-CM | POA: Insufficient documentation

## 2016-03-12 DIAGNOSIS — S6991XA Unspecified injury of right wrist, hand and finger(s), initial encounter: Secondary | ICD-10-CM | POA: Diagnosis present

## 2016-03-12 DIAGNOSIS — F17210 Nicotine dependence, cigarettes, uncomplicated: Secondary | ICD-10-CM | POA: Insufficient documentation

## 2016-03-12 DIAGNOSIS — Y999 Unspecified external cause status: Secondary | ICD-10-CM | POA: Diagnosis not present

## 2016-03-12 DIAGNOSIS — Z79899 Other long term (current) drug therapy: Secondary | ICD-10-CM | POA: Diagnosis not present

## 2016-03-12 MED ORDER — OXYCODONE-ACETAMINOPHEN 5-325 MG PO TABS
ORAL_TABLET | ORAL | Status: AC
  Administered 2016-03-12: 1 via ORAL
  Filled 2016-03-12: qty 1

## 2016-03-12 MED ORDER — OXYCODONE-ACETAMINOPHEN 5-325 MG PO TABS
1.0000 | ORAL_TABLET | ORAL | Status: DC | PRN
  Administered 2016-03-12: 1 via ORAL

## 2016-03-12 MED ORDER — MELOXICAM 7.5 MG PO TABS: 7.5000 mg | ORAL_TABLET | Freq: Every day | ORAL | 2 refills | Status: AC

## 2016-03-12 MED ORDER — OXYCODONE-ACETAMINOPHEN 7.5-325 MG PO TABS: 1.0000 | ORAL_TABLET | Freq: Four times a day (QID) | ORAL | 0 refills | Status: DC | PRN

## 2016-03-12 NOTE — ED Triage Notes (Signed)
Sling applied in triage

## 2016-03-12 NOTE — ED Triage Notes (Signed)
Pt states fell earlier today and landed on R elbow. Pt presents with swelling to R elbow and down R arm. Pt denies LOC at this time. Pt is visibly uncomfortable.

## 2016-03-12 NOTE — ED Provider Notes (Signed)
[facility] Emergency Department Provider Note ____________________________________________  Time seen: Approximately 4:30 PM  I have reviewed the triage vital signs and the nursing notes.   HISTORY  Chief Complaint Arm Pain    HPI Ethan Sawyer is a 30 y.o. male resenting with right elbow pain after slipping on ice this morning. Patient states that he landed on his right arm during the fall. He did not hit his head or lose consciousness. His right arm pain is worsened with supination. Acute, sharp pain is currently 3 out of 10 in intensity. Patient denies prior trauma or surgeries to the right upper extremity. He is right-handed. He has taken Percocet, which has provided relief.  Past Medical History:  Diagnosis Date  . Anxiety   . Back pain   . Depression   . Migraine   . Panic disorder   . PTSD (post-traumatic stress disorder)   . Right knee pain   . TBI (traumatic brain injury) [facility](HCC)     Patient Active Problem List   Diagnosis Date Noted  . Chronic nausea 09/14/2015  . 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 Headache, chronic daily 07/18/2014  . Cognitive impairment, mild, so stated 07/18/2014  . Convulsions/seizures (HCC) 04/30/2014  . Migraine without aura 06/30/2013  . Gait instability 06/30/2013    Past Surgical History:  Procedure Laterality Date  . none      Prior to Admission medications   Medication Sig Start Date End Date Taking? Authorizing Provider  clonazePAM (KLONOPIN) 1 MG tablet Take 1 mg by mouth 2 (two) times daily as needed for anxiety.     Historical Provider, MD  LamoTRIgine XR 200 MG TB24 TAKE 1 TABLET BY MOUTH EVERY NIGHT AT BEDTIME 09/23/15   Anson FretAntonia B Ahern, MD  magnesium oxide (MAG-OX) 400 MG tablet Take 1 tablet (400 mg total) by mouth daily. 08/12/13   Ramond MarrowPeter J Sumner, DO  meloxicam (MOBIC) 7.5 MG tablet Take 1 tablet (7.5 mg total) by mouth daily. 03/12/16 03/12/17  Dayna BarkerJaclyn M Cloma Rahrig, PA-C  ondansetron  (ZOFRAN-ODT) 4 MG disintegrating tablet DISSOLVE 1 TABLET BY MOUTH EVERY 8 HOURS AS NEEDED 02/23/16   Anson FretAntonia B Ahern, MD  oxyCODONE-acetaminophen (PERCOCET) 7.5-325 MG tablet Take 1 tablet by mouth every 6 (six) hours as needed for severe pain. 03/12/16 03/12/17  Orvil FeilJaclyn M Avacyn Kloosterman, PA-C  pantoprazole (PROTONIX) 40 MG tablet Take 40 mg by mouth daily.    Historical Provider, MD  sertraline (ZOLOFT) 100 MG tablet Take 150 mg by mouth daily.     Historical Provider, MD  SUMAtriptan (IMITREX) 50 MG tablet TAKE 1 TABLET BY MOUTH AS NEEDED FOR HEADACHE, MAY REPEAT IN 2 HOURS AS NEEDED FOR 1 MORE DOSE 03/02/16   Anson FretAntonia B Ahern, MD  tiZANidine (ZANAFLEX) 4 MG tablet Take 4 mg by mouth 3 (three) times daily. Taking 3 times a day as needed    Historical Provider, MD    Allergies Patient has no known allergies.  Family History  Problem Relation Age of Onset  . Breast cancer Mother     Social History Social History  Substance Use Topics  . Smoking status: Current Every Day Smoker    Packs/day: 0.75    Types: Cigarettes  . Smokeless tobacco: Never Used  . Alcohol use No     Comment: Quit : 7864yrs ago    Review of Systems Constitutional: No recent illness. Cardiovascular: Denies chest pain or palpitations. Respiratory: Denies shortness of breath. Musculoskeletal: Pain in right  elbow Skin: Negative for rash, wound, lesion. Neurological: Negative for focal weakness or numbness.  ____________________________________________   PHYSICAL EXAM:  VITAL SIGNS: ED Triage Vitals  Enc Vitals Group     BP 03/12/16 1223 (!) 145/70     Pulse Rate 03/12/16 1223 97     Resp 03/12/16 1223 (!) 22     Temp 03/12/16 1223 98.4 F (36.9 C)     Temp Source 03/12/16 1223 Oral     SpO2 03/12/16 1223 100 %     Weight 03/12/16 1224 225 lb (102.1 kg)     Height 03/12/16 1224 6\' 4"  (1.93 m)     Head Circumference --      Peak Flow --      Pain Score 03/12/16 1224 8     Pain Loc --      Pain Edu? --       Excl. in GC? --     Constitutional: Alert and oriented. Well appearing and in no acute distress. Eyes: Conjunctivae are normal. EOMI. Head: Atraumatic. Respiratory: Normal respiratory effort.   Musculoskeletal:Patient has 5/5 strength in the left upper extremity. FROM, left upper extremity. Strength for the right upper extremity was limited to 3 out of 5, likely secondary to pain. Patient has full range of motion at the right shoulder and wrist. He is able to perform extension at the right elbow but not supination. Palpable radial and ulnar pulses. Reflexes are 2+ and symmetric. Neurologic:  Normal speech and language. No gross focal neurologic deficits are appreciated. Speech is normal. No gait instability. Skin:  Skin is warm, dry and intact. Atraumatic. No bruising visualized. Psychiatric: Mood and affect are normal. Speech and behavior are normal.  ____________________________________________   LABS (all labs ordered are listed, but only abnormal results are displayed)  Labs Reviewed - No data to display ____________________________________________  RADIOLOGY  I, Orvil FeilJaclyn M Gwynevere Lizana, personally viewed and evaluated these images (plain radiographs) as part of my medical decision making, as well as reviewing the written report by the radiologist.  DG elbow complete: Subtle radial head fracture without displacement. Joint effusion.   PROCEDURES  Procedure(s) performed:  Percocet/roxicet Sugar Tong Splint Application   ____________________________________________   INITIAL IMPRESSION / ASSESSMENT AND PLAN / ED COURSE  Clinical Course     Pertinent labs & imaging results that were available during my care of the patient were reviewed by me and considered in my medical decision making (see chart for details).  Assessment and plan: Patient has a nondisplaced radial head fracture. Right upper extremity was immobilized in a sugar tong splint. Patient was referred to orthopedist  on-call, Dr. Martha ClanKrasinski. He was advised to make an appointment for Monday. A two day course of Percocet was prescribed for pain. In addition, patient was discharged with a prescription for Mobic. Patient's vital signs are stable at this time. All patient questions were answered. ____________________________________________   FINAL CLINICAL IMPRESSION(S) / ED DIAGNOSES  Final diagnoses:  Closed nondisplaced fracture of head of right radius, initial encounter       Orvil FeilJaclyn M Evart Mcdonnell, PA-C 03/12/16 1641    Sharyn CreamerMark Quale, MD 03/19/16 [id number]

## 2016-03-12 NOTE — ED Triage Notes (Signed)
No obvious dislocation noted at this time, pt does however present with significant swelling.

## 2016-04-04 ENCOUNTER — Other Ambulatory Visit: Payer: Self-pay | Admitting: Neurology

## 2016-04-05 ENCOUNTER — Other Ambulatory Visit: Payer: Self-pay | Admitting: Neurology

## 2016-05-24 ENCOUNTER — Ambulatory Visit: Payer: Self-pay | Admitting: Neurology

## 2016-07-18 ENCOUNTER — Ambulatory Visit: Payer: Self-pay | Admitting: Neurology

## 2016-07-26 ENCOUNTER — Encounter: Payer: Self-pay | Admitting: Neurology

## 2016-07-26 ENCOUNTER — Ambulatory Visit (INDEPENDENT_AMBULATORY_CARE_PROVIDER_SITE_OTHER): Admitting: Neurology

## 2016-07-26 ENCOUNTER — Encounter (INDEPENDENT_AMBULATORY_CARE_PROVIDER_SITE_OTHER): Payer: Self-pay

## 2016-07-26 VITALS — BP 135/84 | HR 85 | Ht 76.0 in | Wt 224.6 lb

## 2016-07-26 DIAGNOSIS — R519 Headache, unspecified: Secondary | ICD-10-CM

## 2016-07-26 DIAGNOSIS — R51 Headache: Secondary | ICD-10-CM | POA: Diagnosis not present

## 2016-07-26 MED ORDER — PROPRANOLOL HCL ER 120 MG PO CP24: 120.0000 mg | ORAL_CAPSULE | Freq: Every day | ORAL | 11 refills | Status: DC

## 2016-07-26 NOTE — Patient Instructions (Signed)
Remember to drink plenty of fluid, eat healthy meals and do not skip any meals. Try to eat protein with a every meal and eat a healthy snack such as fruit or nuts in between meals. Try to keep a regular sleep-wake schedule and try to exercise daily, particularly in the form of walking, 20-30 minutes a day, if you can.   As far as your medications are concerned, I would like to suggest: Stop Verapamil and start propranolol. Watch blood pressure and pulse daily and give me feedback so we can adjust  I would like to see you back in 6 months, sooner if we need to. Please call us with any interim questions, concerns, problems, updates or refill requests.   Our phone number is [phone]. We also have an after hours call service for urgent matters and there is a physician on-call for urgent questions. For any emergencies you know to call 911 or go to the nearest emergency room  Propranolol extended-release capsules What is this medicine? PROPRANOLOL (proe PRAN oh lole) is a beta-blocker. Beta-blockers reduce the workload on the heart and help it to beat more regularly. This medicine is used to treat high blood pressure, heart muscle disease, and prevent chest pain caused by angina. It is also used to prevent migraine headaches. You should not use this medicine to treat a migraine that has already started. This medicine may be used for other purposes; ask your health care provider or pharmacist if you have questions. COMMON BRAND NAME(S): Inderal LA, Inderal XL, InnoPran XL What should I tell my health care provider before I take this medicine? They need to know if you have any of these conditions: -circulation problems, or blood vessel disease -diabetes -history of heart attack or heart disease, vasospastic angina -kidney disease -liver disease -lung or breathing disease, like asthma or emphysema -pheochromocytoma -slow heart rate -thyroid disease -an unusual or allergic reaction to propranolol,  other beta-blockers, medicines, foods, dyes, or preservatives -pregnant or trying to get pregnant -breast-feeding How should I use this medicine? Take this medicine by mouth with a glass of water. Follow the directions on the prescription label. Do not crush or chew. Take your doses at regular intervals. Do not take your medicine more often than directed. Do not stop taking except on the advice of your doctor or health care professional. Talk to your pediatrician regarding the use of this medicine in children. Special care may be needed. Overdosage: If you think you have taken too much of this medicine contact a poison control center or emergency room at once. NOTE: This medicine is only for you. Do not share this medicine with others. What if I miss a dose? If you miss a dose, take it as soon as you can. If it is almost time for your next dose, take only that dose. Do not take double or extra doses. What may interact with this medicine? Do not take this medicine with any of the following medications: -feverfew -phenothiazines like chlorpromazine, mesoridazine, prochlorperazine, thioridazine This medicine may also interact with the following medications: -aluminum hydroxide gel -antipyrine -antiviral medicines for HIV or AIDS -barbiturates like phenobarbital -certain medicines for blood pressure, heart disease, irregular heart beat -cimetidine -ciprofloxacin -diazepam -fluconazole -haloperidol -isoniazid -medicines for cholesterol like cholestyramine or colestipol -medicines for mental depression -medicines for migraine headache like almotriptan, eletriptan, frovatriptan, naratriptan, rizatriptan, sumatriptan, zolmitriptan -NSAIDs, medicines for pain and inflammation, like ibuprofen or naproxen -phenytoin -rifampin -teniposide -theophylline -thyroid medicines -tolbutamide -warfarin -zileuton This list may  not describe all possible interactions. Give your health care provider a  list of all the medicines, herbs, non-prescription drugs, or dietary supplements you use. Also tell them if you smoke, drink alcohol, or use illegal drugs. Some items may interact with your medicine. What should I watch for while using this medicine? Visit your doctor or health care professional for regular check ups. Contact your doctor right away if your symptoms worsen. Check your blood pressure and pulse rate regularly. Ask your health care professional what your blood pressure and pulse rate should be, and when you should contact them. Do not stop taking this medicine suddenly. This could lead to serious heart-related effects. You may get drowsy or dizzy. Do not drive, use machinery, or do anything that needs mental alertness until you know how this drug affects you. Do not stand or sit up quickly, especially if you are an older patient. This reduces the risk of dizzy or fainting spells. Alcohol can make you more drowsy and dizzy. Avoid alcoholic drinks. This medicine can affect blood sugar levels. If you have diabetes, check with your doctor or health care professional before you change your diet or the dose of your diabetic medicine. Do not treat yourself for coughs, colds, or pain while you are taking this medicine without asking your doctor or health care professional for advice. Some ingredients may increase your blood pressure. What side effects may I notice from receiving this medicine? Side effects that you should report to your doctor or health care professional as soon as possible: -allergic reactions like skin rash, itching or hives, swelling of the face, lips, or tongue -breathing problems -changes in blood sugar -cold hands or feet -difficulty sleeping, nightmares -dry peeling skin -hallucinations -muscle cramps or weakness -slow heart rate -swelling of the legs and ankles -vomiting Side effects that usually do not require medical attention (report to your doctor or health care  professional if they continue or are bothersome): -change in sex drive or performance -diarrhea -dry sore eyes -hair loss -nausea -weak or tired This list may not describe all possible side effects. Call your doctor for medical advice about side effects. You may report side effects to FDA at 1-800-FDA-1088. Where should I keep my medicine? Keep out of the reach of children. Store at room temperature between 15 and 30 degrees C (59 and 86 degrees F). Protect from light, moisture and freezing. Keep container tightly closed. Throw away any unused medicine after the expiration date. NOTE: This sheet is a summary. It may not cover all possible information. If you have questions about this medicine, talk to your doctor, pharmacist, or health care provider.  2018 Elsevier/Gold Standard (2012-11-22 14:58:56)
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
## 2016-07-27 NOTE — Progress Notes (Signed)
 CHIEF COMPLAINT  Chief Complaint   Patient presents with    Establish Care       HPI  David Jordan is a 31 y.o. male who presents to est care/annual physical.     Diet: could be improved  Exercise: could be improved  F/u with dentist regularly: yes

## 2016-08-29 ENCOUNTER — Other Ambulatory Visit: Payer: Self-pay | Admitting: Neurology

## 2016-08-29 DIAGNOSIS — R110 Nausea: Secondary | ICD-10-CM

## 2016-09-22 ENCOUNTER — Other Ambulatory Visit: Payer: Self-pay | Admitting: Neurology

## 2017-02-07 ENCOUNTER — Emergency Department
Admission: EM | Admit: 2017-02-07 | Discharge: 2017-02-07 | Disposition: A | Discharge status: Home / Self Care | Attending: Emergency Medicine | Admitting: Emergency Medicine

## 2017-02-07 DIAGNOSIS — S81811A Laceration without foreign body, right lower leg, initial encounter: Secondary | ICD-10-CM | POA: Insufficient documentation

## 2017-02-07 DIAGNOSIS — W260XXA Contact with knife, initial encounter: Secondary | ICD-10-CM | POA: Insufficient documentation

## 2017-02-07 DIAGNOSIS — Y998 Other external cause status: Secondary | ICD-10-CM | POA: Diagnosis not present

## 2017-02-07 DIAGNOSIS — Z79899 Other long term (current) drug therapy: Secondary | ICD-10-CM | POA: Insufficient documentation

## 2017-02-07 DIAGNOSIS — F17210 Nicotine dependence, cigarettes, uncomplicated: Secondary | ICD-10-CM | POA: Diagnosis not present

## 2017-02-07 DIAGNOSIS — Y929 Unspecified place or not applicable: Secondary | ICD-10-CM | POA: Diagnosis not present

## 2017-02-07 DIAGNOSIS — Y9389 Activity, other specified: Secondary | ICD-10-CM | POA: Diagnosis not present

## 2017-02-07 MED ORDER — IBUPROFEN 600 MG PO TABS: 600.0000 mg | ORAL_TABLET | Freq: Three times a day (TID) | ORAL | 0 refills | Status: DC | PRN

## 2017-02-07 MED ORDER — LIDOCAINE HCL (PF) 1 % IJ SOLN
INTRAMUSCULAR | Status: AC
  Filled 2017-02-07: qty 5

## 2017-02-07 MED ORDER — TRAMADOL HCL 50 MG PO TABS: 50.0000 mg | ORAL_TABLET | Freq: Four times a day (QID) | ORAL | 0 refills | Status: DC | PRN

## 2017-02-07 MED ORDER — BACITRACIN ZINC 500 UNIT/GM EX OINT
TOPICAL_OINTMENT | Freq: Once | CUTANEOUS | Status: AC
  Administered 2017-02-07: 12:00:00 via TOPICAL
  Filled 2017-02-07: qty 0.9

## 2017-02-07 NOTE — ED Provider Notes (Signed)
[facility] Emergency Department Provider Note   ____________________________________________   None    (approximate)  I have reviewed the triage vital signs and the nursing notes.   HISTORY  Chief Complaint Leg Injury    HPI Ethan Sawyer is a 31 y.o. male patient is a laceration to the right lower leg. Patient state he was cutting in the next slip. Patient state bleeding controlled with direct pressure. Patient states tetanus status up-to-date.Patient rates the pain as a 3/10. Patient described a pain as "achy".   Past Medical History:  Diagnosis Date  . Anxiety   . Back pain   . Depression   . Migraine   . Panic disorder   . PTSD (post-traumatic stress disorder)   . Right knee pain   . TBI (traumatic brain injury) [facility](HCC)     Patient Active Problem List   Diagnosis Date Noted  . Chronic daily headache 07/26/2016  . Chronic nausea 09/14/2015  . 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 Headache, chronic daily 07/18/2014  . Cognitive impairment, mild, so stated 07/18/2014  . Convulsions/seizures (HCC) 04/30/2014  . Migraine without aura 06/30/2013  . Gait instability 06/30/2013    Past Surgical History:  Procedure Laterality Date  . none      Prior to Admission medications   Medication Sig Start Date End Date Taking? Authorizing Provider  ibuprofen (ADVIL,MOTRIN) 600 MG tablet Take 1 tablet (600 mg total) every 8 (eight) hours as needed by mouth. 02/07/17   Joni ReiningSmith, Kaelyn Nauta K, PA-C  LamoTRIgine XR 200 MG TB24 TAKE 1 TABLET BY MOUTH EVERY NIGHT AT BEDTIME 09/25/16   Anson FretAhern, Antonia B, MD  magnesium oxide (MAG-OX) 400 MG tablet Take 1 tablet (400 mg total) by mouth daily. 08/12/13   Ramond MarrowSumner, Peter J, DO  meloxicam (MOBIC) 7.5 MG tablet Take 1 tablet (7.5 mg total) by mouth daily. 03/12/16 03/12/17  Pia MauWoods, Jaclyn M, PA-C  ondansetron (ZOFRAN-ODT) 4 MG disintegrating tablet DISSOLVE 1 TABLET BY MOUTH EVERY 8 HOURS AS NEEDED 08/30/16    Anson FretAhern, Antonia B, MD  pantoprazole (PROTONIX) 40 MG tablet Take 40 mg by mouth daily.    [provider]  propranolol ER (INDERAL LA) 120 MG 24 hr capsule Take 1 capsule (120 mg total) by mouth at bedtime. 07/26/16   Anson FretAhern, Antonia B, MD  sertraline (ZOLOFT) 100 MG tablet Take 150 mg by mouth daily.     [provider]  SUMAtriptan (IMITREX) 50 MG tablet TAKE 1 TABLET BY MOUTH AS NEEDED FOR HEADACHE, MAY REPEAT IN 2 HOURS AS NEEDED FOR 1 MORE DOSE 03/02/16   Anson FretAhern, Antonia B, MD  tiZANidine (ZANAFLEX) 4 MG tablet Take 4 mg by mouth 3 (three) times daily. Taking 3 times a day as needed    [provider]  traMADol (ULTRAM) 50 MG tablet Take 1 tablet (50 mg total) every 6 (six) hours as needed by mouth for moderate pain. 02/07/17   Joni ReiningSmith, Christabell Loseke K, PA-C    Allergies Compazine [prochlorperazine]  Family History  Problem Relation Age of Onset  . Breast cancer Mother     Social History Social History   Tobacco Use  . Smoking status: Current Every Day Smoker    Packs/day: 0.75    Types: Cigarettes  . Smokeless tobacco: Never Used  Substance Use Topics  . Alcohol use: No    Alcohol/week: 0.0 oz    Comment: Quit : 6058yrs ago  . Drug use: No  Review of Systems  Constitutional: No fever/chills Eyes: No visual changes. ENT: No sore throat. Cardiovascular: Denies chest pain. Respiratory: Denies shortness of breath. Gastrointestinal: No abdominal pain.  No nausea, no vomiting.  No diarrhea.  No constipation. Genitourinary: Negative for dysuria. Musculoskeletal: Negative for back pain. Skin: Negative for rash. Neurological: Negative for headaches, focal weakness or numbness. Psychiatric:Anxiety, depression, panic disorder, and PTSD. Allergic/Immunilogical: Compazine ____________________________________________   PHYSICAL EXAM:  VITAL SIGNS: ED Triage Vitals  Enc Vitals Group     BP 02/07/17 1118 117/72     Pulse Rate 02/07/17 1118 100     Resp  02/07/17 1118 19     Temp 02/07/17 1118 98.1 F (36.7 C)     Temp Source 02/07/17 1118 Oral     SpO2 02/07/17 1118 96 %     Weight 02/07/17 1118 220 lb (99.8 kg)     Height 02/07/17 1118 6\' 4"  (1.93 m)     Head Circumference --      Peak Flow --      Pain Score 02/07/17 1114 3     Pain Loc --      Pain Edu? --      Excl. in GC? --    Constitutional: Alert and oriented. Well appearing and in no acute distress. Cardiovascular: Normal rate, regular rhythm. Grossly normal heart sounds.  Good peripheral circulation. Respiratory: Normal respiratory effort.  No retractions. Lungs CTAB. Neurologic:  Normal speech and language. No gross focal neurologic deficits are appreciated. No gait instability. Skin:  1 cm laceration to the right leg . Psychiatric: Mood and affect are normal. Speech and behavior are normal.  ____________________________________________   LABS (all labs ordered are listed, but only abnormal results are displayed)  Labs Reviewed - No data to display ____________________________________________  EKG   ____________________________________________  RADIOLOGY  No results found.  ____________________________________________   PROCEDURES  Procedure(s) performed: LACERATION REPAIR Performed by: Joni Reining Authorized by: Joni Reining Consent: Verbal consent obtained. Risks and benefits: risks, benefits and alternatives were discussed Consent given by: patient Patient identity confirmed: provided demographic data Prepped and Draped in normal sterile fashion Wound explored  Laceration Location: Right leg  Laceration Length: 1cm  No Foreign Bodies seen or palpated  Anesthesia: local infiltration  Local anesthetic: lidocaine 1% without epinephrine  Anesthetic total: 2 ml  Irrigation method: syringe Amount of cleaning: standard  Skin closure: 3-0 nylon Number of sutures: 3 Technique: Interrupted Patient tolerance: Patient tolerated the  procedure well with no immediate complications.    Procedures  Critical Care performed: No  ____________________________________________   INITIAL IMPRESSION / ASSESSMENT AND PLAN / ED COURSE  As part of my medical decision making, I reviewed the following data within the electronic MEDICAL RECORD NUMBER    Pain secondary to laceration right leg. Patient given discharge care instructions. Patient returned back in 10 days for suture removal.      ____________________________________________   FINAL CLINICAL IMPRESSION(S) / ED DIAGNOSES  Final diagnoses:  Leg laceration, right, initial encounter     ED Discharge Orders        Ordered    traMADol (ULTRAM) 50 MG tablet  Every 6 hours PRN     02/07/17 1138    ibuprofen (ADVIL,MOTRIN) 600 MG tablet  Every 8 hours PRN     02/07/17 1138       Note:  This document was prepared using Dragon voice recognition software and may include unintentional dictation errors.    Katrinka Blazing,  Arther AbbottRonald K, PA-C 02/07/17 1145    Emily FilbertWilliams, Jonathan E, MD 02/07/17 1410

## 2017-02-07 NOTE — ED Notes (Signed)
Dressing applied with ointment. Pt educated how to care for site. Pt showed understanding with teachback.

## 2017-02-07 NOTE — ED Triage Notes (Signed)
Pt presents today with laceration to the right knee area. Pt states he was cutting and the knife slipped. VS are WNL. Pt is A/o.

## 2017-04-11 ENCOUNTER — Other Ambulatory Visit: Payer: Self-pay

## 2017-04-11 ENCOUNTER — Emergency Department
Admission: EM | Admit: 2017-04-11 | Discharge: 2017-04-11 | Disposition: A | Discharge status: Home / Self Care | Attending: Emergency Medicine | Admitting: Emergency Medicine

## 2017-04-11 ENCOUNTER — Encounter: Payer: Self-pay | Admitting: *Deleted

## 2017-04-11 DIAGNOSIS — Z87891 Personal history of nicotine dependence: Secondary | ICD-10-CM | POA: Insufficient documentation

## 2017-04-11 DIAGNOSIS — G8929 Other chronic pain: Secondary | ICD-10-CM | POA: Diagnosis not present

## 2017-04-11 DIAGNOSIS — F1020 Alcohol dependence, uncomplicated: Secondary | ICD-10-CM | POA: Diagnosis not present

## 2017-04-11 DIAGNOSIS — Z79899 Other long term (current) drug therapy: Secondary | ICD-10-CM | POA: Insufficient documentation

## 2017-04-11 DIAGNOSIS — Z1339 Encounter for screening examination for other mental health and behavioral disorders: Secondary | ICD-10-CM | POA: Diagnosis present

## 2017-04-11 NOTE — ED Triage Notes (Signed)
Pt requesting detox from alcohol.  Last drink was 10 hours ago.  Denies SI or HI.  Pt smoked marijuana today.  Pt alert.

## 2017-04-11 NOTE — ED Provider Notes (Signed)
[facility] Emergency Department Provider Note  Time seen: 9:20 PM  I have reviewed the triage vital signs and the nursing notes.   HISTORY  Chief Complaint Alcohol Problem    HPI Ethan Sawyer is a 32 y.o. male with a past medical history of anxiety, depression, PTSD, TBI presents to the emergency department for alcohol use and detox.  According to the patient he has a history of alcoholism, was sober for approximately 4 years until 2 years ago when he relapsed.  He has been drinking on a daily basis, last drink approximately 12 hours ago and is hoping to detox from alcohol.  States occasional marijuana use but denies any other drug use.  Denies any medical complaints today.  States some nausea but states that is chronic.  Otherwise negative review of systems.   Past Medical History:  Diagnosis Date  . Anxiety   . Back pain   . Depression   . Migraine   . Panic disorder   . PTSD (post-traumatic stress disorder)   . Right knee pain   . TBI (traumatic brain injury) [facility])     Patient Active Problem List   Diagnosis Date Noted  . Chronic daily headache 07/26/2016  . Chronic nausea 09/14/2015  . 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 Headache, chronic daily 07/18/2014  . Cognitive impairment, mild, so stated 07/18/2014  . Convulsions/seizures (HCC) 04/30/2014  . Migraine without aura 06/30/2013  . Gait instability 06/30/2013    Past Surgical History:  Procedure Laterality Date  . none      Prior to Admission medications   Medication Sig Start Date End Date Taking? Authorizing Provider  ibuprofen (ADVIL,MOTRIN) 600 MG tablet Take 1 tablet (600 mg total) every 8 (eight) hours as needed by mouth. 02/07/17   Joni Reining, PA-C  LamoTRIgine XR 200 MG TB24 TAKE 1 TABLET BY MOUTH EVERY NIGHT AT BEDTIME 09/25/16   Anson Fret, MD  magnesium oxide (MAG-OX) 400 MG tablet Take 1 tablet (400 mg total) by mouth daily. 08/12/13   Ramond Marrow, DO  ondansetron (ZOFRAN-ODT) 4 MG disintegrating tablet DISSOLVE 1 TABLET BY MOUTH EVERY 8 HOURS AS NEEDED 08/30/16   Anson Fret, MD  pantoprazole (PROTONIX) 40 MG tablet Take 40 mg by mouth daily.    [provider]  propranolol ER (INDERAL LA) 120 MG 24 hr capsule Take 1 capsule (120 mg total) by mouth at bedtime. 07/26/16   Anson Fret, MD  sertraline (ZOLOFT) 100 MG tablet Take 150 mg by mouth daily.     [provider]  SUMAtriptan (IMITREX) 50 MG tablet TAKE 1 TABLET BY MOUTH AS NEEDED FOR HEADACHE, MAY REPEAT IN 2 HOURS AS NEEDED FOR 1 MORE DOSE 03/02/16   Anson Fret, MD  tiZANidine (ZANAFLEX) 4 MG tablet Take 4 mg by mouth 3 (three) times daily. Taking 3 times a day as needed    [provider]  traMADol (ULTRAM) 50 MG tablet Take 1 tablet (50 mg total) every 6 (six) hours as needed by mouth for moderate pain. 02/07/17   Joni Reining, PA-C    Allergies  Allergen Reactions  . Compazine [Prochlorperazine]     Family History  Problem Relation Age of Onset  . Breast cancer Mother     Social History Social History   Tobacco Use  . Smoking status: Former Smoker    Packs/day: 0.75    Types: Cigarettes  . Smokeless  tobacco: Never Used  Substance Use Topics  . Alcohol use: Yes    Alcohol/week: 0.0 oz    Comment: Quit : 3962yrs ago  . Drug use: Yes    Review of Systems Constitutional: Negative for fever. Eyes: Negative for visual complaints ENT: Negative for congestion or cough Cardiovascular: Negative for chest pain. Respiratory: Negative for shortness of breath. Gastrointestinal: Negative for abdominal pain, vomiting.  Positive for nausea which is chronic per patient from TBI. Genitourinary: Negative for dysuria. Musculoskeletal: Negative for back pain. Skin: Negative for rash. Neurological: Negative for headache All other ROS negative  ____________________________________________   PHYSICAL EXAM:  VITAL SIGNS: ED  Triage Vitals [04/11/17 2050]  Enc Vitals Group     BP 138/88     Pulse Rate 91     Resp 20     Temp 98.9 F (37.2 C)     Temp Source Oral     SpO2 99 %     Weight 220 lb (99.8 kg)     Height 6\' 4"  (1.93 m)     Head Circumference      Peak Flow      Pain Score      Pain Loc      Pain Edu?      Excl. in GC?    Constitutional: Alert and oriented. Well appearing and in no distress. Eyes: Normal exam ENT   Head: Normocephalic and atraumatic.   Mouth/Throat: Mucous membranes are moist. Cardiovascular: Normal rate, regular rhythm. No murmur Respiratory: Normal respiratory effort without tachypnea nor retractions. Breath sounds are clear  Gastrointestinal: Soft and nontender. No distention.   Musculoskeletal: Nontender with normal range of motion in all extremities.  Neurologic:  Normal speech and language. No gross focal neurologic deficits  Skin:  Skin is warm, dry and intact.  Psychiatric: Mood and affect are normal.  ____________________________________________   INITIAL IMPRESSION / ASSESSMENT AND PLAN / ED COURSE  Pertinent labs & imaging results that were available during my care of the patient were reviewed by me and considered in my medical decision making (see chart for details).  Patient presents the emergency department for alcohol use hoping to detox.  Overall the patient appears well, no distress, no medical complaints today.  No SI or HI.  Last alcohol use approximate 10-12 hours ago per patient.  We will check labs, discussed with TTS for possible placement [facility].  Patient agreeable to this plan of care.  Discussed patient with Roxanne of TTS, patient has [employer] insurance.  She has referred the patient to [facility] [place].  They do not have any beds available tonight but states they would likely have beds available tomorrow and would take the patient as a walk-in.  Patient agreeable to this plan of care we will discharge from the  emergency department.  Patient feels confident that he can remain sober until being evaluated tomorrow [facility].  ____________________________________________   FINAL CLINICAL IMPRESSION(S) / ED DIAGNOSES  Alcohol use     Minna AntisPaduchowski, Kelisha Dall, MD 04/11/17 2324

## 2017-07-19 ENCOUNTER — Other Ambulatory Visit: Payer: Self-pay | Admitting: Neurology

## 2017-07-19 DIAGNOSIS — R110 Nausea: Secondary | ICD-10-CM

## 2017-08-03 ENCOUNTER — Other Ambulatory Visit: Payer: Self-pay | Admitting: Neurology

## 2017-10-27 ENCOUNTER — Other Ambulatory Visit: Payer: Self-pay | Admitting: Neurology

## 2017-10-27 DIAGNOSIS — R110 Nausea: Secondary | ICD-10-CM

## 2017-11-05 ENCOUNTER — Other Ambulatory Visit: Payer: Self-pay | Admitting: Neurology

## 2017-12-05 ENCOUNTER — Other Ambulatory Visit: Payer: Self-pay | Admitting: Neurology

## 2017-12-05 ENCOUNTER — Telehealth: Payer: Self-pay | Admitting: Neurology

## 2017-12-05 DIAGNOSIS — R110 Nausea: Secondary | ICD-10-CM

## 2017-12-05 MED ORDER — ONDANSETRON 4 MG PO TBDP: ORAL_TABLET | ORAL | 0 refills | Status: DC

## 2017-12-05 MED ORDER — LAMOTRIGINE ER 200 MG PO TB24: 1.0000 | ORAL_TABLET | Freq: Every day | ORAL | 1 refills | Status: DC

## 2017-12-05 NOTE — Telephone Encounter (Addendum)
Pts wife [name](not on DPR pts emergency contact) called requesting refills for LamoTRIgine XR 200 MG TB24 and ondansetron (ZOFRAN-ODT) 4 MG disintegrating tablet. Blake Divine stating she spoke with Angie in billing to make a payment and scheduled a yearly f/u with myself. FYI

## 2017-12-05 NOTE — Telephone Encounter (Addendum)
Refills sent to get pt through appt. Pt's wife Blake Divine aware.

## 2017-12-25 ENCOUNTER — Other Ambulatory Visit: Payer: Self-pay | Admitting: Neurology

## 2017-12-25 NOTE — Telephone Encounter (Signed)
Propanolol refilled x 1 month with note to pharmacy: patient must keep FU in Oct.

## 2018-01-24 ENCOUNTER — Ambulatory Visit (INDEPENDENT_AMBULATORY_CARE_PROVIDER_SITE_OTHER): Admitting: Neurology

## 2018-01-24 ENCOUNTER — Encounter: Payer: Self-pay | Admitting: Neurology

## 2018-01-24 VITALS — BP 111/66 | HR 68 | Ht 76.0 in | Wt 230.0 lb

## 2018-01-24 DIAGNOSIS — Z79899 Other long term (current) drug therapy: Secondary | ICD-10-CM

## 2018-01-24 DIAGNOSIS — G43709 Chronic migraine without aura, not intractable, without status migrainosus: Secondary | ICD-10-CM | POA: Diagnosis not present

## 2018-01-24 DIAGNOSIS — R110 Nausea: Secondary | ICD-10-CM

## 2018-01-24 MED ORDER — PROPRANOLOL HCL ER 120 MG PO CP24: 120.0000 mg | ORAL_CAPSULE | Freq: Every day | ORAL | 4 refills | Status: DC

## 2018-01-24 MED ORDER — ERENUMAB-AOOE 140 MG/ML ~~LOC~~ SOAJ: 140.0000 mg | SUBCUTANEOUS | 11 refills | Status: DC

## 2018-01-24 MED ORDER — TIZANIDINE HCL 4 MG PO TABS: 4.0000 mg | ORAL_TABLET | Freq: Four times a day (QID) | ORAL | 11 refills | Status: DC | PRN

## 2018-01-24 MED ORDER — LAMOTRIGINE ER 300 MG PO TB24: 300.0000 mg | ORAL_TABLET | Freq: Every day | ORAL | 6 refills | Status: DC

## 2018-01-24 MED ORDER — ONDANSETRON 4 MG PO TBDP: ORAL_TABLET | ORAL | 11 refills | Status: DC

## 2018-01-24 MED ORDER — SUMATRIPTAN SUCCINATE 100 MG PO TABS: 100.0000 mg | ORAL_TABLET | ORAL | 11 refills | Status: DC | PRN
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
## 2018-01-24 NOTE — Patient Instructions (Signed)
Labs today Increase Lamictal to 300mg  a day Start Aimovig monthly  Erenumab: Patient drug information [employer] here. Copyright [phone] [employer] All rights reserved. (For additional information see "Erenumab: Drug information") Brand Names: [place]  Aimovig  Brand Names: [place]  Aimovig  What is this drug used for?   It is used to prevent migraine headaches.  What do I need to tell my doctor BEFORE I take this drug?   If you have an allergy to this drug or any part of this drug.   If you are allergic to any drugs like this one, any other drugs, foods, or other substances. Tell your doctor about the allergy and what signs you had, like rash; hives; itching; shortness of breath; wheezing; cough; swelling of face, lips, tongue, or throat; or any other signs.   This drug may interact with other drugs or health problems.   Tell your doctor and pharmacist about all of your drugs (prescription or OTC, natural products, vitamins) and health problems. You must check to make sure that it is safe for you to take this drug with all of your drugs and health problems. Do not start, stop, or change the dose of any drug without checking with your doctor.  What are some things I need to know or do while I take this drug?   Tell all of your health care providers that you take this drug. This includes your doctors, nurses, pharmacists, and dentists.   If you have a latex allergy, talk with your doctor.   Tell your doctor if you are pregnant or plan on getting pregnant. You will need to talk about the benefits and risks of using this drug while you are pregnant.   Tell your doctor if you are breast-feeding. You will need to talk about any risks to your baby.  What are some side effects that I need to call my doctor about right away?   WARNING/CAUTION: Even though it may be rare, some people may have very bad and sometimes deadly side effects when taking a drug. Tell your doctor or get  medical help right away if you have any of the following signs or symptoms that may be related to a very bad side effect:   Signs of an allergic reaction, like rash; hives; itching; red, swollen, blistered, or peeling skin with or without fever; wheezing; tightness in the chest or throat; trouble breathing, swallowing, or talking; unusual hoarseness; or swelling of the mouth, face, lips, tongue, or throat.  What are some other side effects of this drug?   All drugs may cause side effects. However, many people have no side effects or only have minor side effects. Call your doctor or get medical help if any of these side effects or any other side effects bother you or do not go away:   Redness or swelling where the shot is given.   Pain where the shot was given.   Constipation.   These are not all of the side effects that may occur. If you have questions about side effects, call your doctor. Call your doctor for medical advice about side effects.   You may report side effects to your national health agency.  How is this drug best taken?   Use this drug as ordered by your doctor. Read all information given to you. Follow all instructions closely.   It is given as a shot into the fatty part of the skin on the top of the thigh,  belly area, or upper arm.   If you will be giving yourself the shot, your doctor or nurse will teach you how to give the shot.   Follow how to use as you have been told by the doctor or read the package insert.   If stored in a refrigerator, let this drug come to room temperature before using it. Leave it at room temperature for at least 30 minutes. Do not heat this drug.   Protect from heat and sunlight.   Do not shake.   Do not give into skin that is irritated, bruised, red, infected, or scarred.   Do not use if the solution is cloudy, leaking, or has particles.   Do not use if solution changes color.   Throw away after using. Do not use the device more than 1 time.    Throw away needles in a needle/sharp disposal box. Do not reuse needles or other items. When the box is full, follow all local rules for getting rid of it. Talk with a doctor or pharmacist if you have any questions.  What do I do if I miss a dose?   Take a missed dose as soon as you think about it.   After taking a missed dose, start a new schedule based on when the dose is taken.  How do I store and/or throw out this drug?   Store in a refrigerator. Do not freeze.   Store in the carton to protect from light.   Do not use if it has been frozen.   If you drop this drug on a hard surface, do not use it.   If needed, you may store at room temperature for up to 7 days. Write down the date you take this drug out of the refrigerator. If stored at room temperature and not used within 7 days, throw this drug away.   Do not put this drug back in the refrigerator after it has been stored at room temperature.   Keep all drugs in a safe place. Keep all drugs out of the reach of children and pets.   Throw away unused or expired drugs. Do not flush down a toilet or pour down a drain unless you are told to do so. Check with your pharmacist if you have questions about the best way to throw out drugs. There may be drug take-back programs in your area.  General drug facts   If your symptoms or health problems do not get better or if they become worse, call your doctor.   Do not share your drugs with others and do not take anyone else's drugs.   Keep a list of all your drugs (prescription, natural products, vitamins, OTC) with you. Give this list to your doctor.   Talk with the doctor before starting any new drug, including prescription or OTC, natural products, or vitamins.   Some drugs may have another patient information leaflet. If you have any questions about this drug, please talk with your doctor, nurse, pharmacist, or other health care provider.   If you think there has been an overdose, call your poison  control center or get medical care right away. Be ready to tell or show what was taken, how much, and when it happened.  Lamotrigine tablets What is this medicine? LAMOTRIGINE (la MOE Patrecia Pace) is used to control seizures in adults and children with epilepsy and Lennox-Gastaut syndrome. It is also used in adults to treat bipolar disorder. This medicine may  be used for other purposes; ask your health care provider or pharmacist if you have questions. COMMON BRAND NAME(S): Lamictal What should I tell my health care provider before I take this medicine? They need to know if you have any of these conditions: -a history of depression or bipolar disorder -aseptic meningitis during prior use of lamotrigine -folate deficiency -kidney disease -liver disease -suicidal thoughts, plans, or attempt; a previous suicide attempt by you or a family member -an unusual or allergic reaction to lamotrigine or other seizure medications, other medicines, foods, dyes, or preservatives -pregnant or trying to get pregnant -breast-feeding How should I use this medicine? Take this medicine by mouth with a glass of water. Follow the directions on the prescription label. Do not chew these tablets. If this medicine upsets your stomach, take it with food or milk. Take your doses at regular intervals. Do not take your medicine more often than directed. A special MedGuide will be given to you by the pharmacist with each new prescription and refill. Be sure to read this information carefully each time. Talk to your pediatrician regarding the use of this medicine in children. While this drug may be prescribed for children as young as 2 years for selected conditions, precautions do apply. Overdosage: If you think you have taken too much of this medicine contact a poison control center or emergency room at once. NOTE: This medicine is only for you. Do not share this medicine with others. What if I miss a dose? If you miss a  dose, take it as soon as you can. If it is almost time for your next dose, take only that dose. Do not take double or extra doses. What may interact with this medicine? -carbamazepine -male hormones, including contraceptive or birth control pills -methotrexate -phenobarbital -phenytoin -primidone -pyrimethamine -rifampin -trimethoprim -valproic acid This list may not describe all possible interactions. Give your health care provider a list of all the medicines, herbs, non-prescription drugs, or dietary supplements you use. Also tell them if you smoke, drink alcohol, or use illegal drugs. Some items may interact with your medicine. What should I watch for while using this medicine? Visit your doctor or health care professional for regular checks on your progress. If you take this medicine for seizures, wear a Medic Alert bracelet or necklace. Carry an identification card with information about your condition, medicines, and doctor or health care professional. It is important to take this medicine exactly as directed. When first starting treatment, your dose will need to be adjusted slowly. It may take weeks or months before your dose is stable. You should contact your doctor or health care professional if your seizures get worse or if you have any new types of seizures. Do not stop taking this medicine unless instructed by your doctor or health care professional. Stopping your medicine suddenly can increase your seizures or their severity. Contact your doctor or health care professional right away if you develop a rash while taking this medicine. Rashes may be very severe and sometimes require treatment in the hospital. Deaths from rashes have occurred. Serious rashes occur more often in children than adults taking this medicine. It is more common for these serious rashes to occur during the first 2 months of treatment, but a rash can occur at any time. You may get drowsy, dizzy, or have blurred  vision. Do not drive, use machinery, or do anything that needs mental alertness until you know how this medicine affects you. To reduce dizzy or  fainting spells, do not sit or stand up quickly, especially if you are an older patient. Alcohol can increase drowsiness and dizziness. Avoid alcoholic drinks. If you are taking this medicine for bipolar disorder, it is important to report any changes in your mood to your doctor or health care professional. If your condition gets worse, you get mentally depressed, feel very hyperactive or manic, have difficulty sleeping, or have thoughts of hurting yourself or committing suicide, you need to get help from your health care professional right away. If you are a caregiver for someone taking this medicine for bipolar disorder, you should also report these behavioral changes right away. The use of this medicine may increase the chance of suicidal thoughts or actions. Pay special attention to how you are responding while on this medicine. Your mouth may get dry. Chewing sugarless gum or sucking hard candy, and drinking plenty of water may help. Contact your doctor if the problem does not go away or is severe. Women who become pregnant while using this medicine may enroll in the [place] American Antiepileptic Drug Pregnancy Registry by calling [phone]. This registry collects information about the safety of antiepileptic drug use during pregnancy. What side effects may I notice from receiving this medicine? Side effects that you should report to your doctor or health care professional as soon as possible: -allergic reactions like skin rash, itching or hives, swelling of the face, lips, or tongue -blurred or double vision -difficulty walking or controlling muscle movements -fever -headache, stiff neck, and sensitivity to light -painful sores in the mouth, eyes, or nose -redness, blistering, peeling or loosening of the skin, including inside the mouth -severe muscle  pain -swollen lymph glands -uncontrollable eye movements -unusual bruising or bleeding -unusually weak or tired -vomiting -worsening of mood, thoughts or actions of suicide or dying -yellowing of the eyes or skin Side effects that usually do not require medical attention (report to your doctor or health care professional if they continue or are bothersome): -diarrhea or constipation -difficulty sleeping -nausea -tremors This list may not describe all possible side effects. Call your doctor for medical advice about side effects. You may report side effects to FDA at 1-800-FDA-1088. Where should I keep my medicine? Keep out of reach of children. Store at room temperature between 15 and 30 degrees C (59 and 86 degrees F). Throw away any unused medicine after the expiration date. NOTE: This sheet is a summary. It may not cover all possible information. If you have questions about this medicine, talk to your doctor, pharmacist, or health care provider.  2018 Elsevier/Gold Standard (2015-04-22 09:29:40)

## 2018-01-26 DIAGNOSIS — Z79899 Other long term (current) drug therapy: Secondary | ICD-10-CM | POA: Insufficient documentation

## 2018-01-26 LAB — COMPREHENSIVE METABOLIC PANEL
ALT: 32 IU/L (ref 0–44)
AST: 18 IU/L (ref 0–40)
Albumin/Globulin Ratio: 2 (ref 1.2–2.2)
Albumin: 4.5 g/dL (ref 3.5–5.5)
Alkaline Phosphatase: 111 IU/L (ref 39–117)
BUN / CREAT RATIO: 4 — AB (ref 9–20)
BUN: 8 mg/dL (ref 6–20)
Bilirubin Total: 0.3 mg/dL (ref 0.0–1.2)
CO2: 25 mmol/L (ref 20–29)
Calcium: 9.4 mg/dL (ref 8.7–10.2)
Chloride: 104 mmol/L (ref 96–106)
Creatinine, Ser: 1.83 mg/dL — ABNORMAL HIGH (ref 0.76–1.27)
GFR, EST AFRICAN AMERICAN: 55 mL/min/{1.73_m2} — AB (ref >59)
GFR, EST NON AFRICAN AMERICAN: 48 mL/min/{1.73_m2} — AB (ref >59)
GLUCOSE: 75 mg/dL (ref 65–99)
Globulin, Total: 2.3 g/dL (ref 1.5–4.5)
POTASSIUM: 4.6 mmol/L (ref 3.5–5.2)
Sodium: 143 mmol/L (ref 134–144)
TOTAL PROTEIN: 6.8 g/dL (ref 6.0–8.5)

## 2018-01-26 LAB — CBC
HEMATOCRIT: 43.1 % (ref 37.5–51.0)
HEMOGLOBIN: 15.4 g/dL (ref 13.0–17.7)
MCH: 31.5 pg (ref 26.6–33.0)
MCHC: 35.7 g/dL (ref 31.5–35.7)
MCV: 88 fL (ref 79–97)
Platelets: 220 10*3/uL (ref 150–450)
RBC: 4.89 x10E6/uL (ref 4.14–5.80)
RDW: 12.1 % — AB (ref 12.3–15.4)
WBC: 6.1 10*3/uL (ref 3.4–10.8)

## 2018-01-26 LAB — LAMOTRIGINE LEVEL: LAMOTRIGINE LVL: 1.8 ug/mL — AB (ref 2.0–20.0)

## 2018-01-27 ENCOUNTER — Telehealth: Payer: Self-pay | Admitting: Neurology

## 2018-01-27 NOTE — Telephone Encounter (Signed)
Ethan Sawyer, please call patient and discuss the following:   Lamotrigone level is very low, can you ensure he taking it and discuss compliance? We increased the dose which is good since his level is subtherapeutic.His creatinine is significantly elevated 1.83 he needs to see his pcp within 4-6 weeks or as soon as possible to retest and evaluate for kidney disease or any acute etiology, this is significantly elevated compared to his previous baseline.

## 2018-01-28 NOTE — Telephone Encounter (Signed)
Called pt to discuss. No answer and vm box was full. Tried home # and spoke with [name] (on [place]). She asked to discuss as she handles everything. Discussed that his Lamotrigine level is very low. She stated that he has been taking his Lamotrigine level every night and we discussed that Dr. Lucia Gaskins had increased it to 300 mg daily at bedtime. RN discussed that it is very important that he takes his medications every night. She verbalized understanding. She wonders if this has anything to do with the increase in possible seizure activity that was d/w Dr. Lucia Gaskins in office visit. RN also discussed that pt's creatinine level is 1.83 and he needs to see his PCP within 4-6 weeks or asap to rule out kidney disease or any acute etiology. Ethan Sawyer verbalized understanding and will call his PCP today to get an appointment. She asked if this could come from drinking. RN advised that drinking can cause kidney issues but pt would need to see PCP to evaluate further and discuss all possible causes. She verbalized understanding and appreciation for the call.

## 2018-02-04 ENCOUNTER — Telehealth: Payer: Self-pay

## 2018-02-04 NOTE — Telephone Encounter (Signed)
RN tried several times to do PA on cover my meds. Error message is stating pt cannot be found. Call [facility] at [phone] get paper PA version. Fax of [phone] is where the form will be fax.

## 2018-02-07 NOTE — Telephone Encounter (Signed)
PA paper version questions answer fax to [phone] along with last two office visits. Fax to [facility]. Fax receive and confirmed.

## 2018-02-09 ENCOUNTER — Other Ambulatory Visit: Payer: Self-pay | Admitting: Neurology

## 2018-02-09 NOTE — Telephone Encounter (Signed)
We increased his Lamictal to 300mg  that is why we cannot fill this, ask pharmacy to discontinue this and ensure he has the 300mg  prescription thanks

## 2018-02-13 NOTE — Telephone Encounter (Signed)
Spoke with Fayrene FearingJames @ [address] [place]. He stated that the Lamotrigine 200 mg was closed out already and the patient has already picked up the new dose of 300 mg daily. He was not sure how the request got sent for 200 mg.

## 2018-02-13 NOTE — Telephone Encounter (Signed)
Dr Ahern- FYI 

## 2018-02-13 NOTE — Telephone Encounter (Signed)
Rn call [facility] at  [phone] to complete PA. Trai Ells rep at [facility] stated they did not receive the completed paper version from Monday. PA was done on the phone: RN stated pt failed and try two drugs: Depakote, and propranlol. PT has diagnosis of chronic headaches. Authorization [id number]. Dates of approval from 01/14/2018 to 08/12/2018.

## 2018-05-07 IMAGING — CR DG ELBOW COMPLETE 3+V*R*
1 series · 4 of 4 positions shown · non-contrast
Comparison: None.

CLINICAL DATA: Pain after fall.

EXAM:
RIGHT ELBOW - COMPLETE 3+ VIEW

[Series 1: dg elbow complete right (3+view) · 0.14mm/px · 4 of 4 slices shown]
[im 1/4]
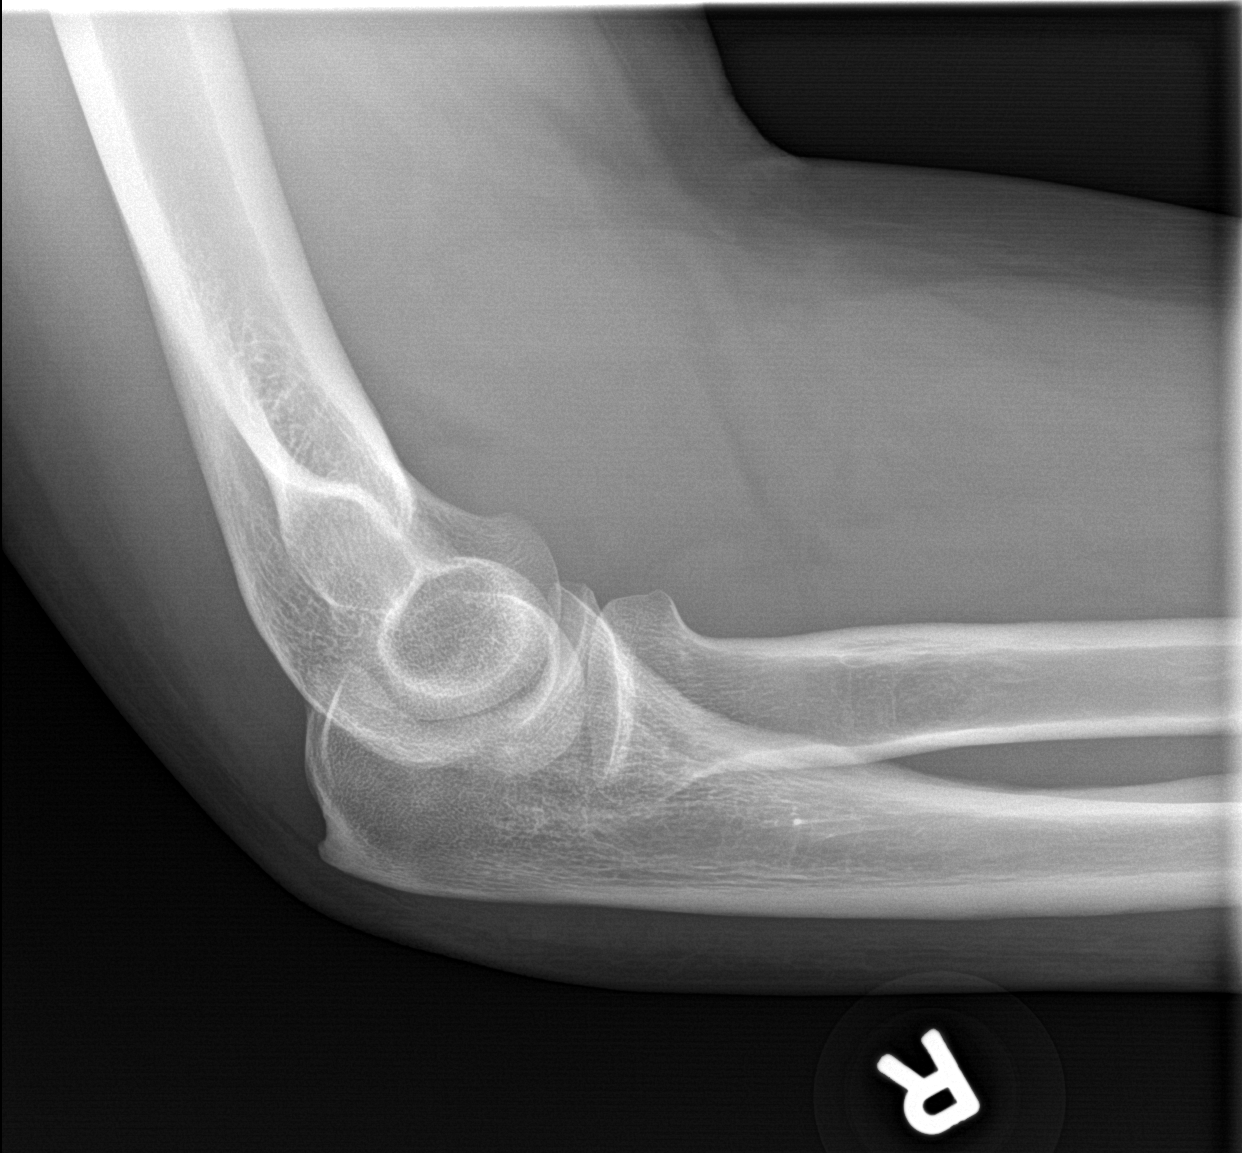
[im 2/4]
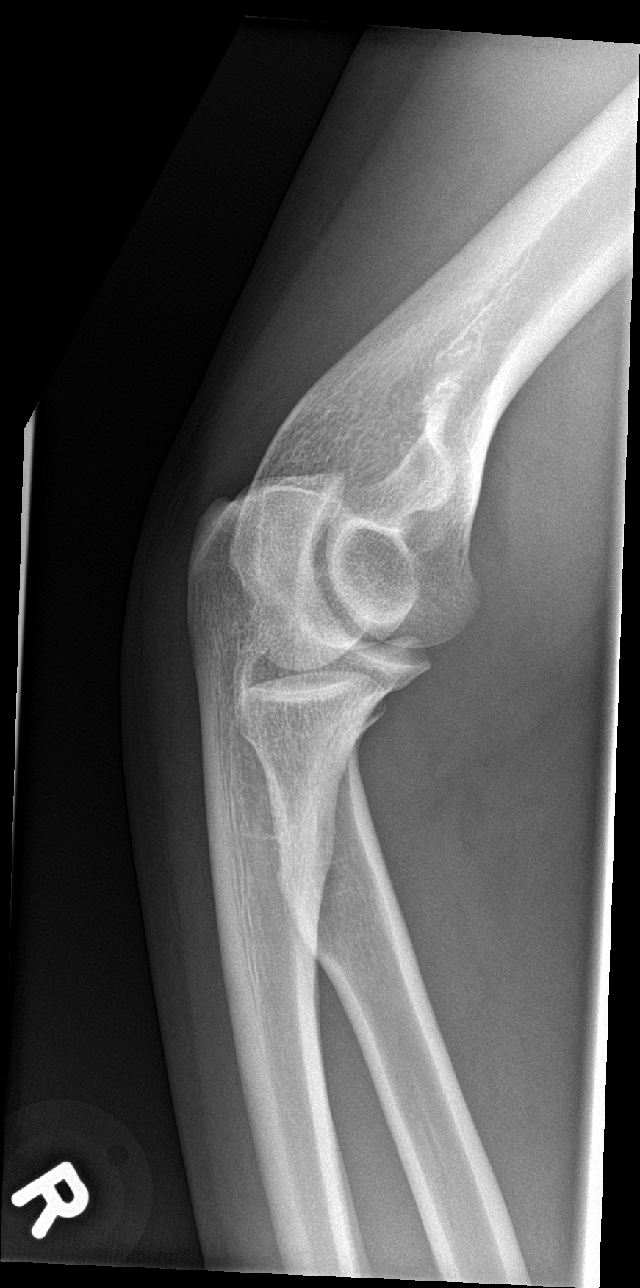
[im 3/4]
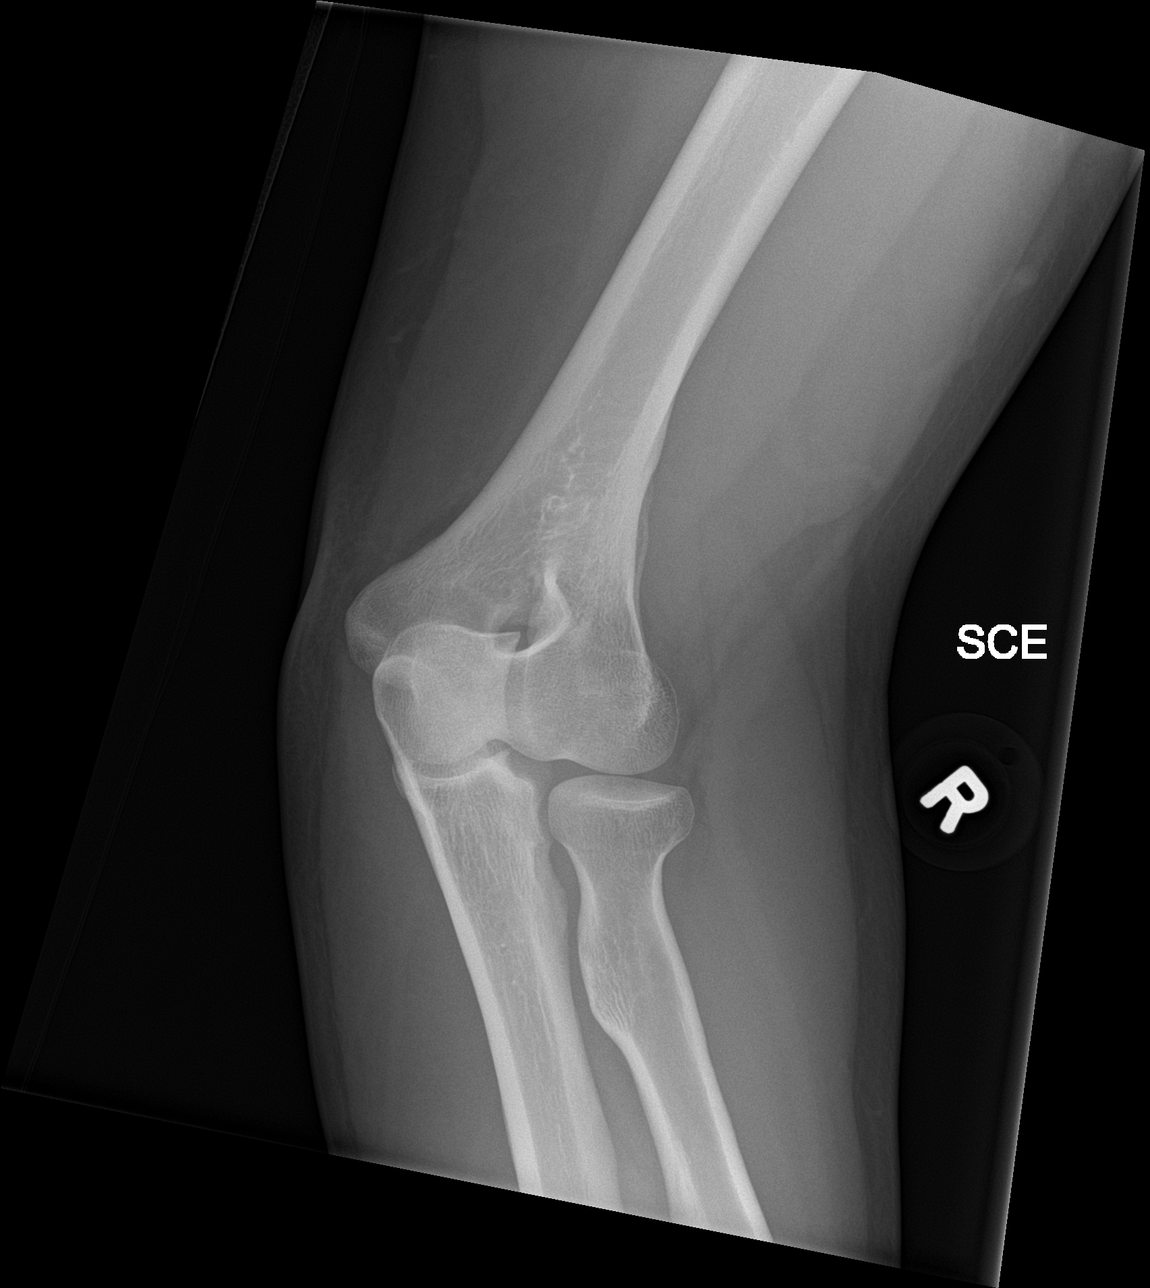
[im 4/4]
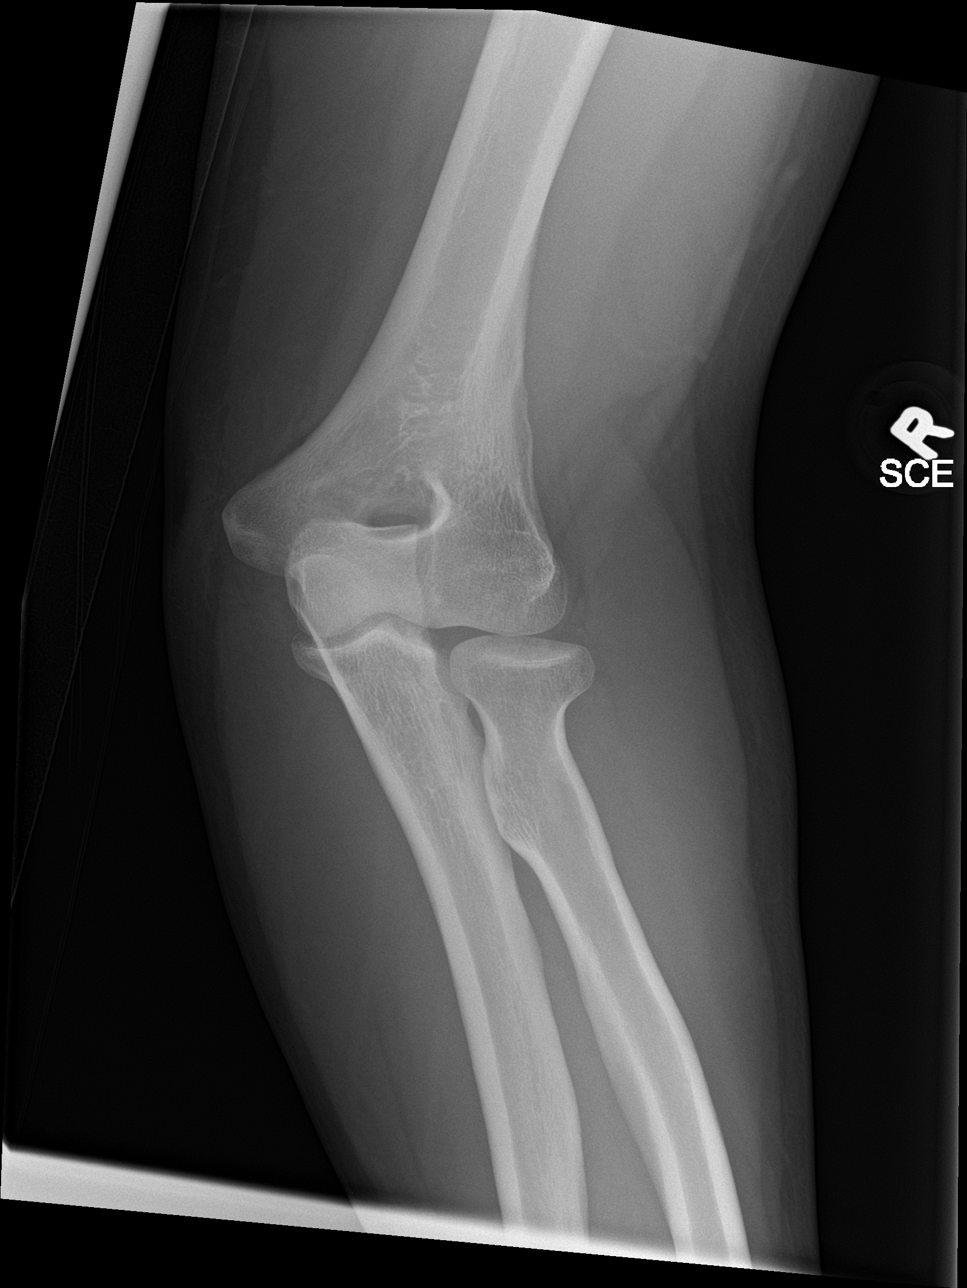

[4 of 4 positions shown; findings below may reference images not displayed]

FINDINGS: There is a joint effusion consistent with a fracture. There is a
subtle step-off associated with the radial head. No other
abnormalities.
IMPRESSION: Subtle radial head fracture without displacement.  Joint effusion.

## 2018-06-16 NOTE — ED Provider Notes (Signed)
-------------------------------------------------------------------------------    Attestation signed by Casilda Carls, MD at 06/17/2018 12:03 AM  I am signing this chart out of administration requirement.  I was available for consultation, but this pa

## 2018-06-16 NOTE — ED Notes (Signed)
 PA Conception Chancy at bedside, cleansing wound.

## 2019-01-01 ENCOUNTER — Telehealth: Payer: Self-pay | Admitting: *Deleted

## 2019-01-01 NOTE — Telephone Encounter (Signed)
Called pt to move his 10/26 appt w/ MD to Amy NP but received no answer and vm was full. If the pt calls back, please r/s him to Amy NP either same day or anytime that works for him.

## 2019-01-27 ENCOUNTER — Encounter: Payer: Self-pay | Admitting: Neurology

## 2019-01-27 ENCOUNTER — Telehealth: Payer: Self-pay | Admitting: *Deleted

## 2019-01-27 ENCOUNTER — Ambulatory Visit: Admitting: Neurology

## 2019-01-27 NOTE — Telephone Encounter (Signed)
Pt no showed f/u with Dr. Ahern.   

## 2019-02-10 ENCOUNTER — Telehealth: Payer: Self-pay | Admitting: *Deleted

## 2019-02-10 NOTE — Telephone Encounter (Signed)
Received Ondansetron ODT 4 mg refill request. Denied per Dr. Jaynee Eagles. Pt needs an appt first. Faxed back to [facility]. Received a receipt of confirmation. He no showed his last f/u.

## 2019-02-19 ENCOUNTER — Telehealth: Payer: Self-pay | Admitting: *Deleted

## 2019-02-19 MED ORDER — PROPRANOLOL HCL ER 120 MG PO CP24: 120.0000 mg | ORAL_CAPSULE | Freq: Every day | ORAL | 0 refills | Status: DC

## 2019-02-19 NOTE — Telephone Encounter (Signed)
Received refill request for propranolol ER 120 mg. Pt's last appt was 01/2018. He last filled the medication on 11/14/2018 #90. I gave him 30 day supply only and included on prescription bottle that appt is needed for further refills and included not to pharmacy.

## 2019-03-13 ENCOUNTER — Other Ambulatory Visit: Payer: Self-pay | Admitting: Neurology

## 2019-04-12 ENCOUNTER — Other Ambulatory Visit: Payer: Self-pay | Admitting: Neurology

## 2019-05-06 ENCOUNTER — Encounter: Payer: Self-pay | Admitting: Family Medicine

## 2019-05-06 ENCOUNTER — Telehealth (INDEPENDENT_AMBULATORY_CARE_PROVIDER_SITE_OTHER): Admitting: Family Medicine

## 2019-05-06 DIAGNOSIS — R7989 Other specified abnormal findings of blood chemistry: Secondary | ICD-10-CM | POA: Diagnosis not present

## 2019-05-06 DIAGNOSIS — G43709 Chronic migraine without aura, not intractable, without status migrainosus: Secondary | ICD-10-CM | POA: Diagnosis not present

## 2019-05-06 DIAGNOSIS — R110 Nausea: Secondary | ICD-10-CM

## 2019-05-06 DIAGNOSIS — R569 Unspecified convulsions: Secondary | ICD-10-CM | POA: Diagnosis not present

## 2019-05-06 MED ORDER — PROPRANOLOL HCL ER 120 MG PO CP24: 120.0000 mg | ORAL_CAPSULE | Freq: Every day | ORAL | 3 refills | Status: DC

## 2019-05-06 MED ORDER — LAMOTRIGINE ER 300 MG PO TB24: ORAL_TABLET | ORAL | 3 refills | Status: DC

## 2019-05-06 MED ORDER — AIMOVIG 140 MG/ML ~~LOC~~ SOAJ: 140.0000 mg | SUBCUTANEOUS | 11 refills | Status: DC

## 2019-05-06 MED ORDER — ONDANSETRON 4 MG PO TBDP: ORAL_TABLET | ORAL | 1 refills | Status: DC
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
## 2019-05-06 NOTE — Addendum Note (Signed)
Addended by: Tamera Stands D on: 05/06/2019 11:33 AM   Modules accepted: Orders

## 2019-05-08 ENCOUNTER — Telehealth: Payer: Self-pay | Admitting: *Deleted

## 2019-05-08 NOTE — Telephone Encounter (Addendum)
Received PA from [facility] for Aimovig. Form completed, on A Lomax, NP's desk for signature.

## 2019-05-08 NOTE — Telephone Encounter (Signed)
PA has been signed by NP and faxed back to [facility]. Confirmation fax has been received.

## 2019-05-15 ENCOUNTER — Telehealth: Payer: Self-pay | Admitting: *Deleted

## 2019-05-15 NOTE — Telephone Encounter (Signed)
I called pt no answer , could not LM.  I called home # and spoke to wife.  I relayed that he needed to have labs done relating to his sz medications.  He has been ill, not feeling well, headaches, and has not been out.  She will relay to him and he will come in for labs.  I relayed that today or next week.  She verbalized understanding.

## 2019-05-15 NOTE — Telephone Encounter (Signed)
-----   Message from Shawnie Dapper, NP sent at 05/13/2019 10:52 AM EST ----- Please call patient and make sure these labs have been drawn. We have to have these to continue his seizure medication.  ----- Message ----- From: SYSTEM Sent: 05/11/2019  12:06 AM EST To: Shawnie Dapper, NP

## 2019-05-20 ENCOUNTER — Other Ambulatory Visit: Payer: Self-pay | Admitting: Neurology

## 2019-05-20 DIAGNOSIS — G43709 Chronic migraine without aura, not intractable, without status migrainosus: Secondary | ICD-10-CM

## 2019-05-20 DIAGNOSIS — R569 Unspecified convulsions: Secondary | ICD-10-CM

## 2019-05-28 ENCOUNTER — Encounter: Payer: Self-pay | Admitting: *Deleted

## 2019-05-28 NOTE — Telephone Encounter (Signed)
I mailed letter to home address to have him come by and get labs done.  [place] Address.

## 2019-06-03 ENCOUNTER — Telehealth: Payer: Self-pay | Admitting: Family Medicine

## 2019-06-03 NOTE — Telephone Encounter (Signed)
I called to speak with patient. VM full on mobile number. I spoke with his wife who reports they have been out of town. She states that he will have labs drawn this week. I expressed importance of updating labs and she voices understanding.

## 2019-06-10 NOTE — Telephone Encounter (Signed)
Pt called stating he went to his providers office to complete lab work but they did not receive any orders.   Pt provided fax# [phone]

## 2019-06-10 NOTE — Telephone Encounter (Signed)
Lab request sent over to [phone] pcp for pt to have drawn.  (this was done w/o signature on order.  Fax confirmation received.

## 2019-06-11 NOTE — Telephone Encounter (Signed)
Received lab results CMP All normal except AST 37, ALT 81, ALK Phos 132. [facility]  [phone] for lamotrigine level- pending.

## 2019-06-17 NOTE — Telephone Encounter (Signed)
Please let him know that I have reviewed labs. His liver enzymes are elevated. This can be related to many things and should be followed closely by PCP. Lamotrigine levels were low. He is on an extended release version of lamotrigine. Please remind him to take medication every day as prescribed. We will recheck labs at next follow up.

## 2019-06-17 NOTE — Telephone Encounter (Signed)
Called and was not able to LVM. (full)

## 2019-06-18 NOTE — Telephone Encounter (Signed)
Per AL/NP psp did draw labs, they have results.  Pt aware to f/u on alk phos.

## 2019-06-18 NOTE — Telephone Encounter (Signed)
I called pt and relayed the results of the labs per AL/NP.  " His liver enzymes are elevated. This can be related to many things and should be followed closely by PCP. Lamotrigine levels were low. He is on an extended release version of lamotrigine. Please remind him to take medication every day as prescribed. We will recheck labs at next follow up."  He verbalized understanding.  He see's [facility] in [place], [place].  Will fax.

## 2019-10-16 ENCOUNTER — Encounter: Payer: Self-pay | Admitting: Family Medicine

## 2019-10-16 ENCOUNTER — Other Ambulatory Visit: Payer: Self-pay | Admitting: Neurology

## 2019-10-16 DIAGNOSIS — G43709 Chronic migraine without aura, not intractable, without status migrainosus: Secondary | ICD-10-CM

## 2019-10-16 DIAGNOSIS — R569 Unspecified convulsions: Secondary | ICD-10-CM

## 2019-10-16 DIAGNOSIS — R110 Nausea: Secondary | ICD-10-CM

## 2019-10-16 NOTE — Telephone Encounter (Signed)
Refilled x 1 month with note to pharmacy: needs to schedule 1 year FU. 

## 2019-12-09 ENCOUNTER — Encounter: Payer: Self-pay | Admitting: Family Medicine

## 2019-12-09 ENCOUNTER — Other Ambulatory Visit: Payer: Self-pay | Admitting: *Deleted

## 2019-12-09 DIAGNOSIS — G43709 Chronic migraine without aura, not intractable, without status migrainosus: Secondary | ICD-10-CM

## 2019-12-09 DIAGNOSIS — R569 Unspecified convulsions: Secondary | ICD-10-CM

## 2019-12-09 MED ORDER — LAMOTRIGINE ER 300 MG PO TB24: ORAL_TABLET | ORAL | 5 refills | Status: DC

## 2020-01-20 ENCOUNTER — Ambulatory Visit: Admit: 2020-01-20 | Payer: PRIVATE HEALTH INSURANCE

## 2020-01-20 DIAGNOSIS — Z0000 Encounter for general adult medical examination without abnormal findings: Secondary | ICD-10-CM

## 2020-01-20 NOTE — Progress Notes (Signed)
[facility] [place]  [facility] [place]  [address]  [place] [address]    Name:  David Jordan Date of Birth: Nov 05, 1985 (34 y.o.)   MRN: [id number]    Date of Service:  01/20/2020     Subjective:     No chief complaint on file.    History of Present Illness:  David Jordan is a(n) 34 y.o. male here today for the following:   HPI    Last tetanus was at Urgent care [address] [facility] used to be. Estimates around a year ago.     Does not anticipate getting a COVID shot. Works for [employer] which originally was not requiring it, but extended deadline date, because they have not acquired enough tests to start November, so now potentially January.     Colorectal Cancer Screening 45-75 yrs Current: na  Skin screening: Denies personal or family history of skin cancer, frequent sun burns, or changing/concerning nevi or lesions.   Pneumovax 65+ years current: NA  Influenza Yearly: WILL GET THROUGH WORK  Tetanus w/in 10 years: WITHIN THE PAST YEAR.   Shingrix 50+ (2-6 months apart) (not covered by Medicare): NA  HIV 18-65 current: NA  STD screen: NA  Controlled Substance check: NA  Lung cancer screening: NA  ASCVD Risk: The ASCVD Risk score Denman George DC Jr., et al., 2013) failed to calculate for the following reasons:    The 2013 ASCVD risk score is only valid for ages 81 to 55       PHQ-9 Scores: No flowsheet data found.    GAD7 Scores: No flowsheet data found.     Last dental exam: UP TO DATE  Last eye exam:  UP TO DATE.   Diet and Exercise: ADMITS DIET IS MORE JUNKY, BUT IS IN CONSTANT MOTION. MEAT ONLY EATS CHICKEN AND [place]. GETTING VEGGIES, BERRIES, WHOLE GRAINS.   Problem list and medication list reviewed and updated where appropriate: NA  Update of Care Teams in Chart:   Patient Care Team:  Venita Sheffield, PA as PCP - General (Family Medicine)      BP Readings from Last 3 Encounters:   01/20/20 107/76       Wt Readings from Last 3 Encounters:   01/20/20 147 lb  (66.7 kg)       Patient denies changes in medical history, surgical history, family history or social history in the interim since last physical.      There is no immunization history on file for this patient.    No results found for: HMCOLON, HMSIGMOIDOSC, HMPAP, HMMAMMO, HMDEXASCAN, HMDIABFOOTEX, HMDIABEYEEX        Health Maintenance Summary     Overdue - Depression Screening (Yearly) Overdue - never done    No completion, postpone, frequency change, or communication history exists for this topic.          Overdue - Immunization: DTaP/Tdap/Td (1 - Tdap) Overdue - never done    No completion, postpone, frequency change, or communication history exists for this topic.          Alcohol Misuse Screening (Yearly) Next due on 01/19/2021    01/20/2020  Done          Comprehensive Physical Exam (Yearly) Next due on 01/19/2021    01/20/2020  Done          Immunization: Pneumococcal (Series Information) Aged Out    No completion, postpone, frequency change, or communication history exists for this  topic.          Discontinued - Hepatitis C Screening (MyChart)  Discontinued    01/20/2020  Frequency changed to Never by Venita Sheffield, PA (Declined by patient)          Discontinued - HIV Screening  Discontinued    01/20/2020  Frequency changed to Never by Venita Sheffield, PA (Declined by patient)          Discontinued - Immunization: COVID-19  Discontinued    01/20/2020  Frequency changed to Never by Venita Sheffield, PA (Managed by non-[facility] provider - Declines, despite education)          Discontinued - Immunization: Influenza  Discontinued    01/20/2020  Frequency changed to Never by Venita Sheffield, PA (Declined by patient)                No current outpatient medications on file.     No current facility-administered medications for this visit.      Review of Systems    No F/C, unintentional weight loss, night sweats, HA, cough, CP, SOB, muscle aches, joint pain, abdominal pain, N/V/D/C or rashes.         Objective:     Vitals:    01/20/20 1419   BP: 107/76   Pulse: 74   SpO2: 100%   Height: 5' 7.52 (1.715 m)   Weight: 147 lb (66.7 kg)   BMI (Calculated): 22.67         Body mass index is 22.67 kg/m.    Physical Exam    Physical Exam   Constitutional: He is oriented to person, place, and time. He appears well-developed and well-nourished. No distress.   HENT:   Head: Normocephalic and atraumatic.   Right Ear: External ear normal.   Left Ear: External ear normal.   Nose: Nose normal.   Mouth/Throat: Oropharynx is clear and moist. No oropharyngeal exudate.   Eyes: Conjunctivae and EOM are normal. Pupils are equal, round, and reactive to light. Right eye exhibits no discharge. Left eye exhibits no discharge. No scleral icterus.   Neck: Normal range of motion. Neck supple. No JVD present. No tracheal deviation present. No thyromegaly present.   Cardiovascular: Normal rate, regular rhythm, normal heart sounds and intact distal pulses.  Exam reveals no gallop and no friction rub. No murmur heard.  Pulmonary/Chest: Effort normal and breath sounds normal. No stridor. No respiratory distress. He has no wheezes. He has no rales. He exhibits no tenderness.   Abdominal: Soft. He exhibits no distension and no mass. There is no tenderness. There is no rebound and no guarding.   Musculoskeletal: Normal range of motion. He exhibits no edema and no tenderness.   Lymphadenopathy: He has no cervical adenopathy.   Neurological: He is alert and oriented to person, place, and time. He has normal reflexes. He displays normal reflexes. No cranial nerve deficit. He exhibits normal muscle tone. Coordination normal.   Skin: Skin is warm and dry. No rash noted. He is not diaphoretic. No erythema. No pallor.   Psychiatric: He has a normal mood and affect. His behavior is normal. Judgment and thought content normal.        Assessment/Plan:     1.) PHYSICAL EXAM - BASELINE LABS.         Venita Sheffield, PA

## 2020-02-04 ENCOUNTER — Ambulatory Visit: Admit: 2020-02-04 | Payer: PRIVATE HEALTH INSURANCE

## 2020-02-04 DIAGNOSIS — Z0000 Encounter for general adult medical examination without abnormal findings: Secondary | ICD-10-CM

## 2020-02-04 LAB — HEPATIC FUNCTION PANEL, SERUM
ALT: 28 U/L (ref 7–52)
AST (SGOT): 25 U/L (ref 13–39)
Albumin: 4.5 g/dL (ref 3.5–5.7)
Alkaline Phosphatase: 61 U/L (ref 36–125)
Bilirubin, Direct: 0.14 mg/dL (ref 0.00–0.40)
Bilirubin, Indirect: 0.66 mg/dL (ref 0.00–1.10)
Total Bilirubin: 0.8 mg/dL (ref 0.0–1.5)
Total Protein: 7.4 g/dL (ref 6.4–8.9)

## 2020-02-04 LAB — RENAL FUNCTION PANEL W/EGFR
Albumin: 4.5 g/dL (ref 3.5–5.7)
Anion Gap: 9 mmol/L (ref 3–16)
BUN: 11 mg/dL (ref 7–25)
CO2: 30 mmol/L (ref 21–33)
Calcium: 9.5 mg/dL (ref 8.6–10.3)
Chloride: 103 mmol/L (ref 98–110)
Creatinine: 0.98 mg/dL (ref 0.60–1.30)
Glucose: 86 mg/dL (ref 70–100)
Osmolality, Calculated: 293 mOsm/kg (ref 278–305)
Phosphorus: 4 mg/dL (ref 2.1–4.7)
Potassium: 4.1 mmol/L (ref 3.5–5.3)
Sodium: 142 mmol/L (ref 133–146)
eGFR AA CKD-EPI: >90 See note.
eGFR NONAA CKD-EPI: >90 See note.

## 2020-02-04 LAB — LIPID PANEL
Cholesterol, Total: 172 mg/dL (ref 0–200)
HDL: 44 mg/dL — ABNORMAL LOW (ref 60–92)
LDL Cholesterol: 110 mg/dL
Triglycerides: 91 mg/dL (ref 10–149)

## 2020-02-04 LAB — CBC
Hematocrit: 42.2 % (ref 38.5–50.0)
Hemoglobin: 14.5 g/dL (ref 13.2–17.1)
MCH: 28.9 pg (ref 27.0–33.0)
MCHC: 34.5 g/dL (ref 32.0–36.0)
MCV: 83.9 fL (ref 80.0–100.0)
MPV: 7.1 fL — ABNORMAL LOW (ref 7.5–11.5)
Platelets: 246 10*3/uL (ref 140–400)
RBC: 5.03 10*6/uL (ref 4.20–5.80)
RDW: 13.3 % (ref 11.0–15.0)
WBC: 4.2 10*3/uL (ref 3.8–10.8)

## 2020-02-04 LAB — HEMOGLOBIN A1C: Hemoglobin A1C: 5.3 % (ref 4.0–5.6)

## 2020-02-10 NOTE — Telephone Encounter (Signed)
-----   Message from Venita Sheffield, [place] sent at 02/10/2020 12:46 PM EST -----  Please notify patient labs stable, no changes needed based on these.

## 2020-02-10 NOTE — Unmapped (Signed)
LMTCB

## 2020-02-11 NOTE — Telephone Encounter (Signed)
Patient retuned call and advised of results

## 2020-05-05 ENCOUNTER — Encounter: Payer: Self-pay | Admitting: Family Medicine

## 2020-05-05 ENCOUNTER — Ambulatory Visit: Payer: Self-pay | Admitting: Family Medicine

## 2020-05-24 ENCOUNTER — Telehealth: Payer: Self-pay | Admitting: Family Medicine

## 2020-05-24 DIAGNOSIS — R569 Unspecified convulsions: Secondary | ICD-10-CM

## 2020-05-24 DIAGNOSIS — G43709 Chronic migraine without aura, not intractable, without status migrainosus: Secondary | ICD-10-CM

## 2020-05-24 NOTE — Telephone Encounter (Signed)
Pt request refill LamoTRIgine 300 MG TB24 24 hour tablet at [facility] [phone]

## 2020-05-25 MED ORDER — LAMOTRIGINE ER 300 MG PO TB24: ORAL_TABLET | ORAL | 5 refills | Status: AC

## 2020-05-25 NOTE — Telephone Encounter (Signed)
Pt has appt 05-27-20.

## 2020-05-27 ENCOUNTER — Telehealth: Payer: Self-pay | Admitting: *Deleted

## 2020-05-27 ENCOUNTER — Encounter: Payer: Self-pay | Admitting: Family Medicine

## 2020-05-27 ENCOUNTER — Telehealth (INDEPENDENT_AMBULATORY_CARE_PROVIDER_SITE_OTHER): Admitting: Family Medicine

## 2020-05-27 DIAGNOSIS — R569 Unspecified convulsions: Secondary | ICD-10-CM

## 2020-05-27 DIAGNOSIS — R748 Abnormal levels of other serum enzymes: Secondary | ICD-10-CM | POA: Diagnosis not present

## 2020-05-27 DIAGNOSIS — G43709 Chronic migraine without aura, not intractable, without status migrainosus: Secondary | ICD-10-CM | POA: Diagnosis not present

## 2020-05-27 MED ORDER — PROPRANOLOL HCL ER 120 MG PO CP24: 120.0000 mg | ORAL_CAPSULE | Freq: Every day | ORAL | 3 refills | Status: AC
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
## 2020-05-27 NOTE — Telephone Encounter (Signed)
-----   Message from Shawnie Dapper, NP sent at 05/27/2020 10:51 AM EST ----- Can you please call [facility] in [facility] system (patient's PCP) to let them know he needs labs. Please call patient to let him know when he can go to have drawn. I would like to have these performed in the next 1-2 weeks. TY!

## 2020-05-27 NOTE — Telephone Encounter (Signed)
I called [facility]  [phone].and could not speak to anyone at the time.  I will try later.  I spoke to pt and relayed AL/NP wanted to get labs 1-2 wks.  I will fax to them once spoken to them.  If issue with this will let him know.  He verbalized understanding.

## 2020-06-10 ENCOUNTER — Telehealth: Payer: Self-pay | Admitting: Family Medicine

## 2020-06-10 NOTE — Telephone Encounter (Signed)
I called call not able to LM for pt (full) called home shauna (wife) LMVM for her asked about email I sent with lab requests for pt to have done at [facility].  Have done?

## 2020-06-10 NOTE — Telephone Encounter (Signed)
Can you please call patient to ensure he has gotten labs ordered at our follow up. Sheould have been performed with PCP at [facility]. I do not see in Epic.

## 2020-06-14 NOTE — Telephone Encounter (Signed)
I  Mailed request to his home address to have done.

## 2020-06-15 ENCOUNTER — Telehealth: Payer: Self-pay | Admitting: Family Medicine

## 2020-06-15 NOTE — Telephone Encounter (Signed)
error 

## 2020-06-29 ENCOUNTER — Telehealth: Payer: Self-pay | Admitting: Family Medicine

## 2020-06-29 NOTE — Telephone Encounter (Signed)
Thank you. If not done in 2 weeks, we need to have him come in for a visit to discuss importance and we can collect at that time. TY for your help!

## 2020-06-29 NOTE — Telephone Encounter (Signed)
I called pt, sleeping, spoke to wife.  She stated that did receive requests in mail.  They just back from vacation last night.  Will have him do this week.  I relayed AL/NP requesting labs to f/u from previous 05-27-20 VV.  She said pt was not cooperative at times to have this done, but will try her hardest to get him to do this.

## 2020-06-29 NOTE — Telephone Encounter (Signed)
Please check on overdue labs.

## 2020-07-20 ENCOUNTER — Encounter: Payer: Self-pay | Admitting: *Deleted

## 2020-07-20 NOTE — Telephone Encounter (Signed)
I called wife since not able to reach husband.  She will let him know that refills will be cancelled unless we can get labs done.  She said she will let him know this and he will get done today.  Takes meds at night.

## 2020-07-21 ENCOUNTER — Other Ambulatory Visit: Payer: Self-pay | Admitting: Family Medicine

## 2020-07-22 LAB — COMPREHENSIVE METABOLIC PANEL
ALT: 58 IU/L — ABNORMAL HIGH (ref 0–44)
AST: 33 IU/L (ref 0–40)
Albumin/Globulin Ratio: 1.7 (ref 1.2–2.2)
Albumin: 4.6 g/dL (ref 4.0–5.0)
Alkaline Phosphatase: 141 IU/L — ABNORMAL HIGH (ref 44–121)
BUN/Creatinine Ratio: 7 — ABNORMAL LOW (ref 9–20)
BUN: 8 mg/dL (ref 6–20)
Bilirubin Total: 0.7 mg/dL (ref 0.0–1.2)
CO2: 24 mmol/L (ref 20–29)
Calcium: 9.8 mg/dL (ref 8.7–10.2)
Chloride: 102 mmol/L (ref 96–106)
Creatinine, Ser: 1.19 mg/dL (ref 0.76–1.27)
Globulin, Total: 2.7 g/dL (ref 1.5–4.5)
Glucose: 120 mg/dL — ABNORMAL HIGH (ref 65–99)
Potassium: 4.4 mmol/L (ref 3.5–5.2)
Sodium: 142 mmol/L (ref 134–144)
Total Protein: 7.3 g/dL (ref 6.0–8.5)
eGFR: 82 mL/min/{1.73_m2} (ref >59)

## 2020-07-22 LAB — LAMOTRIGINE LEVEL: Lamotrigine Lvl: 4.8 ug/mL (ref 2.0–20.0)

## 2020-08-11 NOTE — Telephone Encounter (Signed)
Mailed response to pt.

## 2021-10-11 ENCOUNTER — Inpatient Hospital Stay: Admit: 2021-10-11 | Discharge: 2021-10-15 | Payer: PRIVATE HEALTH INSURANCE | Attending: Medical

## 2021-10-11 ENCOUNTER — Ambulatory Visit: Admit: 2021-10-11 | Discharge: 2021-10-11 | Payer: PRIVATE HEALTH INSURANCE | Attending: Medical

## 2021-10-11 DIAGNOSIS — M25511 Pain in right shoulder: Secondary | ICD-10-CM

## 2021-10-11 NOTE — Progress Notes (Signed)
Chief Complaint: R sided chest pain    HPI:    David Jordan is a 36 y.o. male who presents for evaluation of R sided chest pain for 1 week since using shears. States he felt a pop and noticed immediate bruising in R side of his chest. The pain has significantly improved since initial injury. Complains of occasional N/T down RUE depending on arm movement. The bruising has since resolved. He has been taking naproxen with relief. Denies any neck pain. The pain is intermittent and worse with yawning.      ROS: All others negative except for as stated in HPI     The following portions of the patient's history were reviewed and updated as appropriate: allergies, current medications, past medical history, past surgical history, and past social history.    Physical Examination:  This is a pleasant male, alert, and in no acute distress. Breathing easy and unlabored.    Vitals:    10/11/21 0848   Weight: 147 lb (66.7 kg)       Musculoskeletal: R Shoulder    Full ROM without pain, mild pain in chest  TTP along anterior shoulder/chest  Negative Hawkin's test  Sensation intact to light touch  5/5 strength    Chest:  TTP along R side along sternum and along anterior shoulder  No ecchymosis    Radiology:  X-ray of Right shoulder   No acute osseous findings or significant arthropathic changes.     Cervical spine   No acute osseous findings or significant arthropathic changes.     Assessment:   David Jordan is a 36 y.o. male who presents with R pectoralis strain/tear that occurred on 7/4. Overall his symptoms have greatly improved since initial injury. Explained that it can take 1-3 months for symptoms to resolve and as long as they are continuing to improve then recommend conservative treatment.     Plan:  1. Encouraged rest until pain is resolved, usually 1-3 months  2. Follow up if symptoms return or fail to improve and then can order MRI Chest    Toney Sang PA-C

## 2022-09-21 ENCOUNTER — Observation Stay: Admit: 2022-09-21 | Payer: PRIVATE HEALTH INSURANCE

## 2022-09-21 ENCOUNTER — Observation Stay: Payer: PRIVATE HEALTH INSURANCE

## 2022-09-21 ENCOUNTER — Observation Stay
Admission: EM | Admit: 2022-09-21 | Discharge: 2022-09-22 | Disposition: A | Discharge status: Home / Self Care | Payer: PRIVATE HEALTH INSURANCE

## 2022-09-21 ENCOUNTER — Emergency Department: Admit: 2022-09-21 | Payer: PRIVATE HEALTH INSURANCE

## 2022-09-21 DIAGNOSIS — R4182 Altered mental status, unspecified: Secondary | ICD-10-CM

## 2022-09-21 LAB — DIFFERENTIAL
Basophils Absolute: 39 /uL (ref 0–200)
Basophils Relative: 0.7 % (ref 0.0–1.0)
Eosinophils Absolute: 99 /uL (ref 15–500)
Eosinophils Relative: 1.8 % (ref 0.0–8.0)
Lymphocytes Absolute: 1953 /uL (ref 850–3900)
Lymphocytes Relative: 35.5 % (ref 15.0–45.0)
Monocytes Absolute: 484 /uL (ref 200–950)
Monocytes Relative: 8.8 % (ref 0.0–12.0)
Neutrophils Absolute: 2926 /uL (ref 1500–7800)
Neutrophils Relative: 53.2 % (ref 40.0–80.0)
nRBC: 0 /100 WBC (ref 0–0)

## 2022-09-21 LAB — CBC
Hematocrit: 39.7 % (ref 38.5–50.0)
Hemoglobin: 13.7 g/dL (ref 13.2–17.1)
MCH: 29.2 pg (ref 27.0–33.0)
MCHC: 34.5 g/dL (ref 32.0–36.0)
MCV: 84.4 fL (ref 80.0–100.0)
MPV: 7.1 fL — ABNORMAL LOW (ref 7.5–11.5)
Platelets: 239 10*3/uL (ref 140–400)
RBC: 4.7 10*6/uL (ref 4.20–5.80)
RDW: 13.5 % (ref 11.0–15.0)
WBC: 5.5 10*3/uL (ref 3.8–10.8)

## 2022-09-21 LAB — BASIC METABOLIC PANEL
Anion Gap: 8 mmol/L (ref 3–16)
BUN: 14 mg/dL (ref 7–25)
CO2: 29 mmol/L (ref 21–33)
Calcium: 9 mg/dL (ref 8.6–10.3)
Chloride: 103 mmol/L (ref 98–110)
Creatinine: 0.98 mg/dL (ref 0.60–1.30)
EGFR: >90
Glucose: 113 mg/dL — ABNORMAL HIGH (ref 70–100)
Osmolality, Calculated: 291 mOsm/kg (ref 278–305)
Potassium: 3.4 mmol/L — ABNORMAL LOW (ref 3.5–5.3)
Sodium: 140 mmol/L (ref 133–146)

## 2022-09-21 LAB — TSH: TSH: 3.64 u[IU]/mL (ref 0.45–4.12)

## 2022-09-21 LAB — URINALYSIS W/RFL TO MICROSCOPIC
Bilirubin, UA: NEGATIVE
Blood, UA: NEGATIVE
Glucose, UA: NEGATIVE mg/dL
Ketones, UA: NEGATIVE mg/dL
Leukocyte Esterase, UA: NEGATIVE
Nitrite, UA: NEGATIVE
Protein, UA: NEGATIVE mg/dL
Specific Gravity, UA: 1.006 (ref 1.005–1.035)
Urobilinogen, UA: <2 mg/dL (ref 0.2–1.9)
pH, UA: 6.5 (ref 5.0–8.0)

## 2022-09-21 LAB — VENOUS BLOOD GAS, LINE/SYRINGE
%HBO2-Line Draw: 51 % (ref 40.0–70.0)
Base Excess-Line Draw: 3.6 mmol/L — ABNORMAL HIGH (ref <=3.0)
CO2 Content-Line Draw: 32 mmol/L — ABNORMAL HIGH (ref 25–29)
Carboxyhgb-Line Draw: 1 %
HCO3-Line Draw: 30 mmol/L — ABNORMAL HIGH (ref 24–28)
Methemoglobin-Line Draw: 0.5 % (ref 0.0–1.5)
PCO2-Line Draw: 51 mm Hg (ref 41–51)
PH-Line Draw: 7.38 (ref 7.32–7.42)
PO2-Line Draw: 28 mm Hg (ref 25–40)
Reduced Hemoglobin-Line Draw: 47.5 % — ABNORMAL HIGH (ref 0.0–5.0)

## 2022-09-21 LAB — URINE DRUG SCREEN WITHOUT CONFIRMATION, STAT
Amphetamine, 500 ng/mL Cutoff: NEGATIVE
Barbiturates UR, 300  ng/mL Cutoff: NEGATIVE
Benzodiazepines UR, 300 ng/mL Cutoff: NEGATIVE
Buprenorphine, 5 ng/mL Cutoff: NEGATIVE
Cocaine UR, 300 ng/mL Cutoff: NEGATIVE
Fentanyl, 2 ng/mL Cutoff: NEGATIVE
Methadone, UR, 300 ng/mL Cutoff: NEGATIVE
Opiates UR, 300 ng/mL Cutoff: NEGATIVE
Oxycodone, 100 ng/mL Cutoff: NEGATIVE
THC UR, 50 ng/mL Cutoff: NEGATIVE
Tricyclic Antidepressants, 300 ng/mL Cutoff: NEGATIVE

## 2022-09-21 LAB — MAGNESIUM: Magnesium: 2 mg/dL (ref 1.5–2.5)

## 2022-09-21 LAB — PHOSPHORUS: Phosphorus: 3.3 mg/dL (ref 2.1–4.7)

## 2022-09-21 LAB — HEPATIC FUNCTION PANEL
ALT: 21 U/L (ref 7–52)
AST: 19 U/L (ref 13–39)
Albumin: 4.6 g/dL (ref 3.5–5.7)
Alkaline Phosphatase: 72 U/L (ref 36–125)
Bilirubin, Direct: 0.1 mg/dL (ref 0.0–0.4)
Bilirubin, Indirect: 0.4 mg/dL (ref 0.0–1.1)
Total Bilirubin: 0.5 mg/dL (ref 0.0–1.5)
Total Protein: 7.4 g/dL (ref 6.4–8.9)

## 2022-09-21 LAB — T4, FREE: Free T4: 0.81 ng/dL (ref 0.61–1.76)

## 2022-09-21 LAB — CK: Total CK: 92 U/L (ref 30–223)

## 2022-09-21 LAB — HIGH SENSITIVITY TROPONIN: High Sensitivity Troponin: 3 ng/L (ref 0–20)

## 2022-09-21 LAB — VITAMIN D 25 HYDROXY: Vit D, 25-Hydroxy: 28.6 ng/mL — ABNORMAL LOW (ref 30.0–100.0)

## 2022-09-21 MED ORDER — ketorolac (TORADOL) injection 15 mg
15 | Freq: Once | INTRAMUSCULAR | Status: AC
Start: 2022-09-21 — End: 2022-09-21
  Administered 2022-09-21: 09:00:00 15 mg via INTRAVENOUS

## 2022-09-21 MED ORDER — acetaminophen (TYLENOL) tablet 650 mg
325 | ORAL | Status: AC | PRN
Start: 2022-09-21 — End: 2022-09-22

## 2022-09-21 MED ORDER — sodium chloride 0.9 % IV infusion
INTRAVENOUS | Status: AC
Start: 2022-09-21 — End: 2022-09-22
  Administered 2022-09-21 – 2022-09-22 (×2): 63 mL/h via INTRAVENOUS

## 2022-09-21 MED ORDER — gadobutrol (GADAVIST) 1 mmol/1 mL IV solution 6.5 mL
1 | Freq: Once | INTRAVENOUS | PRN
Start: 2022-09-21 — End: 2022-09-22

## 2022-09-21 MED ORDER — ondansetron (ZOFRAN) injection 4 mg
4 | INTRAMUSCULAR | Status: AC | PRN
Start: 2022-09-21 — End: 2022-09-22

## 2022-09-21 MED ORDER — heparin (porcine) injection 5,000 Units
5000 | Freq: Three times a day (TID) | INTRAMUSCULAR | Status: AC
Start: 2022-09-21 — End: 2022-09-22
  Administered 2022-09-21 – 2022-09-22 (×3): 5000 [IU] via SUBCUTANEOUS

## 2022-09-21 MED ORDER — potassium chloride (KLOR-CON M20) CR tablet 40 mEq
20 | Freq: Once | ORAL | Status: AC
Start: 2022-09-21 — End: 2022-09-21
  Administered 2022-09-21: 09:00:00 40 meq via ORAL

## 2022-09-21 MED ORDER — electrolyte-R (pH 7.4) (NORMOSOL-R pH 7.4) IV bolus 1,000 mL
Freq: Once | INTRAVENOUS | Status: AC
Start: 2022-09-21 — End: 2022-09-21
  Administered 2022-09-21: 09:00:00 1000 mL via INTRAVENOUS

## 2022-09-21 MED ORDER — albuterol (PROVENTIL) nebulizer solution 2.5 mg
2.5 | RESPIRATORY_TRACT | Status: AC | PRN
Start: 2022-09-21 — End: 2022-09-22

## 2022-09-21 MED ORDER — LORazepam (ATIVAN) tablet 1 mg
1 | ORAL | Status: AC | PRN
Start: 2022-09-21 — End: 2022-09-21
  Administered 2022-09-21: 22:00:00 1 mg via ORAL

## 2022-09-21 MED FILL — POTASSIUM CHLORIDE ER 20 MEQ TABLET,EXTENDED RELEASE(PART/CRYST): 20 20 MEQ | ORAL | Qty: 2

## 2022-09-21 MED FILL — HEPARIN (PORCINE) 5,000 UNIT/ML INJECTION SOLUTION: 5000 5,000 unit/mL | INTRAMUSCULAR | Qty: 1

## 2022-09-21 MED FILL — SODIUM CHLORIDE 0.9 % INTRAVENOUS SOLUTION: 63.0000 63.0000 mL/hr | INTRAVENOUS | Qty: 1000

## 2022-09-21 MED FILL — NORMOSOL-R PH 7.4 INTRAVENOUS SOLUTION: 1000.00 1000.00 mL | INTRAVENOUS | Qty: 1000

## 2022-09-21 MED FILL — KETOROLAC 15 MG/ML INJECTION SOLUTION: 15 15 mg/mL | INTRAMUSCULAR | Qty: 1

## 2022-09-21 MED FILL — LORAZEPAM 1 MG TABLET: 1 1 MG | ORAL | Qty: 1

## 2022-09-21 NOTE — H&P (Signed)
[facility]  History and Physical    Admit Date:   09/21/2022    Patient's PCP:  Venita Sheffield, PA  DOB:     May 26, 1985  Patient Class:  Observation    CHIEF COMPLAINT     Chief Complaint   Patient presents with    Lightheadedness       HISTORY OF PRESENT ILLNESS   David Jordan is a 37 y.o. male w/ no significant past medical history who presented to the ED with altered mental status.  He states that he has had 3 episodes with the first 1 starting about a week ago.  He states that a week ago his right arm went numb and he felt like he could not move it lasting for several minutes when he banged it up against the counter and dropped it trying to get it to move.  It spontaneously started to move on its own.  Tuesday night he was eating dinner when he felt nauseous and had this flushed feeling going over his whole body felt numb all over tried to get up and walk to the bathroom where he thought he was getting get sick when he felt his whole body locked up and he does not remember what happened next.  His family in the room with him states that he began acting like he was drunk and crawling around the house to the bathroom while hallucinating.  He was having difficulty speaking this lasted for about 30 minutes to an hour.  He went to bed and woke up the next day his normal self.  He had no recollection of what happened the night before.  This morning around 2 AM he woke up out of bed diaphoretic had the same feeling and then woke his significant other up but does not low how long episode lasted this time and then again came to the ED.  Family denies any syncope or loss of consciousness during any of these episodes.  Nor did he have any falls or trauma.  He does have an headache after these episodes.  Denies any shortness of breath or chest pain.  In emergency department he is hypokalemic at 3.4.  Hepatic panel and CBC unremarkable.  Normal TSH, CK and troponin.  UA does not look infectious.   Normal urine tox.  CT head with no acute intracranial abnormality.  The ED physician talked to neurology who would like to bring him in for EEG.  He is being admitted for altered mental status and will consult neurology.          PAST MEDICAL HISTORY   History reviewed. No pertinent past medical history.    PAST SURGICAL HISTORY     Past Surgical History:   Procedure Laterality Date    WISDOM TOOTH EXTRACTION         MEDICATIONS   (Not in a hospital admission)        Current Facility-Administered Medications   Medication Dose Frequency Provider Last Admin    acetaminophen  650 mg Q4H PRN Shari Heritage, DO      albuterol  2.5 mg RT Q4H PRN Shari Heritage, DO      heparin  5,000 Units 3 times per day Shari Heritage, DO      ondansetron  4 mg Q4H PRN Shari Heritage, DO      sodium chloride 0.9 %  63 mL/hr Continuous [place], DO 63 mL/hr at 09/21/22 8119     Current Outpatient  Medications   Medication Sig    ibuprofen Take 1 tablet (200 mg total) by mouth every 6 hours as needed for Pain.    naproxen Take 1 tablet (375 mg total) by mouth 2 times a day with meals.       ALLERGIES   Codeine and Shellfish derived    SOCIAL HISTORY   TOBACCO:  reports that he has never smoked. He has never used smokeless tobacco.       ETOH:   reports current alcohol use.  DRUG:    Social History     Substance and Sexual Activity   Drug Use Never       FAMILY HISTORY     Family History   Problem Relation Age of Onset    Cancer Neg Hx     Diabetes Neg Hx     Heart disease Neg Hx     Kidney disease Neg Hx     Stroke Neg Hx          REVIEW OF SYSTEMS     General: No fever or chills.  ENT: no sore throat or nasal congestion  Cardiac: No chest pain or palpitations.  Pulm: No shortness of breath or wheezing.  GI: No abdominal pain, N/V.  Musculoskeletal: No leg edema or muscle pain.  Neuro: + Dizziness/lightheadedness.  No HA.   GU: no dysuria or hematuria  Derm: no new rash, no skin lesions  Psych: mood is stable        PHYSICAL EXAM   BP (!) 136/91    Pulse 94   Temp 98.3 F (36.8 C) (Oral)   Resp 11   Ht 5' 9 (1.753 m)   Wt 145 lb 1.6 oz (65.8 kg)   SpO2 99%   BMI 21.43 kg/m       General Appearance:  Alert, cooperative   Head:  Normocephalic, atraumatic/without obvious abnormality    Eyes:  Pupils equal, sclera nonicteric   Throat:  Moist mucus membranes    Neck:  Appears normal, trachea midline   Lungs:  Clear to auscultation bilaterally, respirations unlabored    Heart:  Regular rate and rhythm, S1 and S2 normal   Abdomen:  Soft, non-tender, bowel sounds active all four quadrants   Extremities:  Extremities normal, no edema   Pulses:  2+ and symmetric    Skin:  Warm and dry   Neurologic:  Face symmetric, speech fluent, no tremor       LABS     Results for orders placed or performed during the hospital encounter of 09/21/22   Basic metabolic panel   Result Value Ref Range    Sodium 140 133 - 146 mmol/L    Potassium 3.4 (L) 3.5 - 5.3 mmol/L    Chloride 103 98 - 110 mmol/L    CO2 29 21 - 33 mmol/L    Anion Gap 8 3 - 16 mmol/L    BUN 14 7 - 25 mg/dL    Creatinine 0.98 1.19 - 1.30 mg/dL    Glucose 147 (H) 70 - 100 mg/dL    Calcium 9.0 8.6 - 82.9 mg/dL    Osmolality, Calculated 291 278 - 305 mOsm/kg    EGFR >90    CBC   Result Value Ref Range    WBC 5.5 3.8 - 10.8 10E3/uL    RBC 4.70 4.20 - 5.80 10E6/uL    Hemoglobin 13.7 13.2 - 17.1 g/dL    Hematocrit 56.2 13.0 - 50.0 %  MCV 84.4 80.0 - 100.0 fL    MCH 29.2 27.0 - 33.0 pg    MCHC 34.5 32.0 - 36.0 g/dL    RDW 98.1 19.1 - 47.8 %    Platelets 239 140 - 400 10E3/uL    MPV 7.1 (L) 7.5 - 11.5 fL   Differential   Result Value Ref Range    Neutrophils Relative 53.2 40.0 - 80.0 %    Lymphocytes Relative 35.5 15.0 - 45.0 %    Monocytes Relative 8.8 0.0 - 12.0 %    Eosinophils Relative 1.8 0.0 - 8.0 %    Basophils Relative 0.7 0.0 - 1.0 %    nRBC 0 0 - 0 /100 WBC    Neutrophils Absolute 2,926 1,500 - 7,800 /uL    Lymphocytes Absolute 1,953 850 - 3,900 /uL    Monocytes Absolute 484 200 - 950 /uL    Eosinophils  Absolute 99 15 - 500 /uL    Basophils Absolute 39 0 - 200 /uL   Magnesium   Result Value Ref Range    Magnesium 2.0 1.5 - 2.5 mg/dL   Phosphorus   Result Value Ref Range    Phosphorus 3.3 2.1 - 4.7 mg/dL   CK   Result Value Ref Range    Total CK 92 30 - 223 U/L   Hepatic Function Panel   Result Value Ref Range    Total Bilirubin 0.5 0.0 - 1.5 mg/dL    Bilirubin, Direct 0.1 0.0 - 0.4 mg/dL    AST 19 13 - 39 U/L    ALT 21 7 - 52 U/L    Alkaline Phosphatase 72 36 - 125 U/L    Total Protein 7.4 6.4 - 8.9 g/dL    Albumin 4.6 3.5 - 5.7 g/dL    Bilirubin, Indirect 0.4 0.0 - 1.1 mg/dL   High Sensitivity Troponin   Result Value Ref Range    High Sensitivity Troponin 3 0 - 20 ng/L   TSH (Thyroid Stimulating Hormone)   Result Value Ref Range    TSH 3.64 0.45 - 4.12 uIU/mL   T4, free   Result Value Ref Range    Free T4 0.81 0.61 - 1.76 ng/dL   Venous blood gas   Result Value Ref Range    PH-Line Draw 7.38 7.32 - 7.42    PCO2-Line Draw 51 41 - 51 mm Hg    PO2-Line Draw 28 25 - 40 mm Hg    HCO3-Line Draw 30 (H) 24 - 28 mmol/L    CO2 Content-Line Draw 32 (H) 25 - 29 mmol/L    Base Excess-Line Draw 3.6 (H) -2.0 - 3.0 mmol/L    %HBO2-Line Draw 51.0 40.0 - 70.0 %    Carboxyhgb-Line Draw 1.0 %    Methemoglobin-Line Draw 0.5 0.0 - 1.5 %    Reduced Hemoglobin-Line Draw 47.5 (H) 0.0 - 5.0 %   Urinalysis w/Rfl to Microscopic   Result Value Ref Range    Color, UA Colorless (A) Yellow,Straw    Clarity, UA Clear Clear    Specific Gravity, UA 1.006 1.005 - 1.035    pH, UA 6.5 5.0 - 8.0    Protein, UA Negative Negative mg/dL    Glucose, UA Negative Negative mg/dL    Ketones, UA Negative Negative mg/dL    Bilirubin, UA Negative Negative    Blood, UA Negative Negative    Nitrite, UA Negative Negative    Urobilinogen, UA <2.0 0.2 - 1.9 mg/dL    Leukocyte Esterase,  UA Negative Negative   Urine Drug Screen without Confirmation, STAT   Result Value Ref Range    Amphetamine, 500 ng/mL Cutoff Negative Negative    Barbiturates UR, 300  ng/mL Cutoff  Negative Negative    Buprenorphine, 5 ng/mL Cutoff Negative Negative    Benzodiazepines UR, 300 ng/mL Cutoff Negative Negative    Cocaine UR, 300 ng/mL Cutoff Negative Negative    Methadone, UR, 300 ng/mL Cutoff Negative Negative    Opiates UR, 300 ng/mL Cutoff Negative Negative    Oxycodone, 100 ng/mL Cutoff Negative Negative    Tricyclic Antidepressants, 300 ng/mL Cutoff Negative Negative    THC UR, 50 ng/mL Cutoff Negative Negative    Fentanyl, 2 ng/mL Cutoff Negative Negative         RADIOLOGY   CT Head WO contrast    Result Date: 09/21/2022  EXAM: CT HEAD WO CONTRAST INDICATION: Lightheadedness. Mental status change, unknown cause. TECHNIQUE: Axial thin section CT images of the head were obtained without contrast. Sagittal and coronal 2-D multiplanar reconstructions were performed at the scanner. COMPARISON: None available. FINDINGS: Adequate diagnostic quality. Brain parenchyma: Normal brain attenuation. No intra-axial hemorrhage or mass effect. Ventricles and extraaxial spaces: Normal ventricular system. No extra-axial fluid collection. Orbits, paranasal sinuses, mastoids: No acute orbital abnormality. Clear paranasal sinuses. Clear mastoid air cells. Extracranial soft tissues: Normal. Calvarium and skull base: No fracture or suspicious osseous lesion.     IMPRESSION: 1.  No acute intracranial abnormality. 2.  No intracranial mass effect or hemorrhage. Approved by Estanislado Pandy, MD on 09/21/2022 4:50 AM EDT I have personally reviewed the images and I agree with this report. Report Verified by: Letitia Caul, MD at 09/21/2022 5:06 AM EDT    X-ray Chest PA and Lateral    Result Date: 09/21/2022  EXAM: XR CHEST PA AND LATERAL INDICATION: weakness TECHNIQUE: 2 views of the chest. COMPARISON: None. FINDINGS: Medical Devices: None. Heart and Mediastinum: Cardiomediastinal silhouette is within normal limits. Lungs and Pleura: Lungs are clear with no focal consolidations, pleural effusions or evidence for pneumothorax.  Bones and Soft tissues: No acute abnormalities.     IMPRESSION: No acute cardiopulmonary abnormality. Approved by Estanislado Pandy, MD on 09/21/2022 4:44 AM EDT I have personally reviewed the images and I agree with this report. Report Verified by: Letitia Caul, MD at 09/21/2022 5:04 AM EDT       ASSESSMENT & PLAN   David Jordan is a 37 y.o. male w/ no significant past medical history who is being admitted for altered mental status and will consult neurology.    Patient will be admitted to the hospital for/with the following problems:    Altered mental status; seizure versus psychosomatic/functional disorder  - Admit to telemetry  - Neurology consult  - CT head nonacute  - EEG per neurology  - Pending echo  - Neurochecks every 4 hours  - MRI head pending  - IV fluids  - Seizure/fall precautions    Hypokalemia  - Potassium 3.4  - Replete  - Continue to trend BMP    DVT prophylaxis  - Heparin SQ    CODE STATUS: Full      In my medical opinion patient is not safe for discharge from the hospital/outpatient care.      I personally reviewed the patient's medical record, labs, radiology reports and discussed the patient's case with the ER provider submitting the patient for admission.  Admission was discussed with the patient in detail; all questions answered and patient agrees  with plan.    This note was dictated using voice-recognition software, which occasionally leads to inadvertent typographic errors.     Electronically signed,  Marga Hoots, CNP  09/21/2022  6:39 AM  [facility]

## 2022-09-21 NOTE — ED Notes (Signed)
Pt to CT at this time.

## 2022-09-21 NOTE — Consults (Signed)
[facility]  Care Management/Social Work Assessment      Patient Information     Patient Name: David Jordan  MRN: [id number]  Hospital Day: 0  Inpatient/Observation: Observation  Admit Date: 09/21/2022  Admission Diagnosis: Altered mental status, unspecified altered mental status type [R41.82]  Attending provider: Demetrius Charity, DO    PCP: Venita Sheffield, PA  Home Pharmacy:              [facility] [id number] - [place],  - [address]  [address]  [place] [address]  Phone: [phone]               Issues related to obtaining medications: NONE    Payor Information   Medical Insurance Coverage:  Payor: Advertising copywriter / Plan: Advertising copywriter CHOICE / Product Type: HMO /   Secondary Payor: NONE    Functional Assessment   Functional Assessment  Assessment Information Obtained From:: Spouse  Current Mental Status: Unable to Assess (patient off unit for testing, information obtained from spouse)  Mental Status Prior to Admission: Awake, Oriented to Person, Oriented to Place, Oriented to Time, Oriented to Situation  Activities of Daily Living: Independent  Work History: Environmental education officer  Marital Status: Married  Demographics Correct:: Yes    Current Living Arrangements   Current Living Arrangements  Current Living Arrangements: Home  Type of Housing: House  Who do you live with?: With Family  What family member?: spouse and son  One Story or Two (check all that apply): Multi-Level  Enter the number of steps and rails to enter the residence: 3 steps  Enter the number of steps and rails inside the residence: tri-level home, 7 steps to all levels with landing between  History of Falls?: No    Electrical engineer at Home: Not Warehouse manager Status & Connection to [employer] Status & Connection to [employer]  Are you a veteran?: Yes  Which branch of the military?: [employer]  Are you connected to the  [place] for services?: No  Would you like more information on how to apply for VA services?: No    Support Systems   Emergency contact: Extended Emergency Contact Information  Primary Emergency Contact: Talmadge,Amanda  Mobile Phone: [phone]  Relation: Spouse  Preferred language: English  Interpreter needed? No    Support Systems  Primary Caregiver: Self  Times of available support: No 24/7 hands on available  Marital Status: Married  Demographics Correct:: Yes  Expected Discharge Disposition: Home  Next of Kin: Imanuel Pruiett  Next of Kin Phone Number: [phone]  Assessment Information Obtained From:: Spouse    Other Pertinent Information     Patient off unit for testing, information obtained from spouse at bedside  From home with spouse, plans to return  No HHC or O2 prior, needs TBD  Independent at baseline, works and active driver  No DME used or owned  All questions answered and denies any CM needs at this time   \  Merchant navy officer (For Healthcare)  Advance Directive: Patient does not have advance directive  No Advanced Directive: Patient would NOT like information about advanced directives, forgoing or with-drawing life-sustaining treatment, and withholding resuscitative services  Healthcare Agent Appointed: No  Pre-existing DNR/DNI Order: No  Patient Requests Assistance: No    Discharge Plan     Met with patient to initiate discussion  regarding discharge planning. Introduced self and role of case management/social work and provided Tour manager.       Anticipated Discharge Plan:   Return home with family  No CM needs anticipated    Anticipated Discharge Date:   1-2 days    Anticipated Transportation:   Family     Patient/Family aware and taking part in the discharge plan.  Patient/family educated that once post-acute care needs have been identified, a provider list applicable to the identified post-acute care needs as well as the insurance provider will be provided, and patient/family have the freedom  to choose their provider(s); financial interest(s) are disclosed as appropriate.         Lona Kettle BSN, RN, CCM  ASCOM: [phone]

## 2022-09-21 NOTE — Plan of Care (Signed)
Problem: Safety  Goal: Patient will be injury free during hospitalization  Description: Assess and monitor vitals signs, neurological status including level of consciousness and orientation. Assess patient's risk for falls and implement fall prevention plan of care and interventions per hospital policy.      Ensure arm band on, uncluttered walking paths in room, adequate room lighting, call light and overbed table within reach, bed in low position, wheels locked, side rails up per policy, and non-skid footwear provided.   Outcome: Progressing     Problem: Daily Care  Goal: Daily care needs are met  Description: Assess and monitor ability to perform self care and identify potential discharge needs.  Outcome: Progressing     Problem: Psychosocial Needs  Goal: Demonstrates ability to cope with hospitalization/illness  Description: Assess and monitor patients ability to cope with his/her illness.  Outcome: Progressing     Problem: Psychosocial Needs  Goal: Collaborate with patient/family to identify patient's goals  Outcome: Progressing     Problem: Patient will remain free of injury d/t fall  Goal: High Fall Risk Precautions  Outcome: Progressing

## 2022-09-21 NOTE — ED Notes (Signed)
Bed: A03W  Expected date: 09/21/22  Expected time: 3:33 AM  Means of arrival:   Comments:  [place]

## 2022-09-21 NOTE — ED Provider Notes (Signed)
[facility] ED Note    09/21/2022    Reason for Visit: Lightheadedness        Patient History     HPI:  Michel Hendon is a 37 y.o. White or Caucasian male with a PMH as described below who presents to the ED today with a chief complaint of episodes of difficulty moving his body.  Patient reports that about a week ago he had a 5-10-minute episode where he could not lift his right arm and then it spontaneously resolved.  Yesterday at the dinner table he started feeling nauseated and had numbness, over his whole body.  He got up to go to the bathroom and then got stuck with difficulty moving his arms and legs and difficulty speaking.  This lasted 15-30 minutes.  He then went to bed and was reportedly hallucinating all evening.  Patient has no recollection of the hallucinating.  Patient woke up this morning and felt back to his normal self and had a normal day.  This evening around 2 AM he woke up from sleep and felt very nauseated and had full body sweats and started feeling numbness and tingling all over his body and could not move his body and it would not do what he was trying to do.  Patient reports he is now back to his baseline.  He reports that with these episodes he gets a headache and currently has a mild dull headache.  No current nausea.  No chest pain or abdominal pain with this.  He has never had anything like this happen in the past.    The patient states that there were no other associated signs and symptoms or modifying factors.      History reviewed. No pertinent past medical history.    Past Surgical History:   Procedure Laterality Date    WISDOM TOOTH EXTRACTION         Social History     Socioeconomic History    Marital status: Married     Spouse name: Not on file    Number of children: Not on file    Years of education: Not on file    Highest education level: Not on file   Occupational History    Not on file   Tobacco Use    Smoking status: Never     Smokeless tobacco: Never   Substance and Sexual Activity    Alcohol use: Yes     Comment: Maybe a few beers per week.     Drug use: Never    Sexual activity: Yes   Other Topics Concern    Caffeine Use Not Asked    Occupational Exposure Not Asked    Exercise Not Asked    Seat Belt Not Asked   Social History Narrative    Not on file     Social Determinants of Health     Food Insecurity: Not on file   Transportation Needs: Not on file   Intimate Partner Violence: Not on file   Housing Stability: Not on file       family history is not on file.    Allergies   Allergen Reactions    Codeine Hives and Nausea And Vomiting     Hives and Fever    Shellfish Derived Swelling and Rash       Previous Medications    IBUPROFEN (MOTRIN) 200 MG TABLET    Take 1 tablet (200 mg total) by mouth every 6 hours as needed for Pain.  NAPROXEN (NAPROSYN) 375 MG TABLET    Take 1 tablet (375 mg total) by mouth 2 times a day with meals.         All nursing notes and triage notes were appropriately reviewed in the course of the creation of this note.     Review of Systems     ROS:  All other systems reviewed and are negative, except as mentioned in HPI.    Physical Exam     Vitals:    09/21/22 0340 09/21/22 0346   BP: 157/83    BP Location: Right upper arm    Patient Position: Sitting    BP Cuff Size: Extra Large    Pulse: 72    Resp: 16    Temp: 98.3 F (36.8 C) 98.3 F (36.8 C)   TempSrc: Oral Oral   SpO2: 100%    Weight: 145 lb 1.6 oz (65.8 kg)    Height: 5' 9 (1.753 m)        Nursing notes reviewed.  Please see RN documentation for initial and additional vital signs.    Constitutional:  Well developed, well nourished, no acute distress  Eyes:  Sclera anicteric, conjunctiva normal.  HEENT:  Atraumatic, external ears normal, moist mucous membranes.   Neck: Trachea midline, normal range of motion  Respiratory:  No respiratory distress, Clear to auscultation bilaterally   Cardiovascular:  Regular rate, 2+ pulse.  GI:  Soft, nondistended,  no rebound, no guarding.   Musculoskeletal:  No edema, no deformities.   Skin:  No rash or nodules noted.   Neurologic:  Awake and alert.  Moves all four extremities.  Light touch intact.  Full strength bilateral upper and lower extremities.  Normal finger-nose testing.  No pronator drift.  Cranial nerves intact.  Downgoing Babinski's bilaterally.  Psychiatric:  Normal mood.  Behavior appropriate.          Diagnostic Studies     Labs:    Please see results in chart.    Labs Reviewed   BASIC METABOLIC PANEL   CBC   DIFFERENTIAL   MAGNESIUM   PHOSPHORUS   CK   HEPATIC FUNCTION PANEL   HIGH SENSITIVITY TROPONIN   TSH   T4, FREE   VENOUS BLOOD GAS, LINE/SYRINGE   URINALYSIS W/RFL TO MICROSCOPIC   URINE DRUG SCREEN WITHOUT CONFIRMATION, STAT       Radiology:    Please see results in chart.    XR CHEST PA AND LATERAL  CT HEAD WO CONTRAST    EKG:    EKG shows normal sinus rhythm with normal intervals and axis.  No ST segment deviation concerning for acute ischemia.      Emergency Department Procedures         ED Course and MDM     Lukus Binion is a 37 y.o. White or Caucasian male  who presented to the emergency department with Lightheadedness   who was examined by myself.    Medical Decision Making  Patient presenting with a couple episodes over the last 2 days of nausea followed by feelings of paralysis of his body associate with numbness and tingling.  There is no laterality to his symptoms, rather they involve the entire body.  Low suspicion for ischemic stroke clinically.  Lower suspicion for MS or other demyelinating disease.  I do not suspect cervical spine etiology clinically.  He overall is a reassuring exam here in the emergency room.  Will check electrolytes for possible periodic paralysis.  Will initiate metabolic workup as well.  Low suspicion for infectious etiology clinically.  Will screen with a head CT.    Amount and/or Complexity of Data Reviewed  Labs: ordered.  Radiology: ordered.  ECG/medicine  tests: ordered.        Patient is labs ultimately reassuring.  Mild hypokalemia which was repleted.  Head CT reassuring and chest x-ray normal.  Discussed with neurology who recommends admission for EEG and further evaluation.               ED Treatment:  Medications - No data to display    Impression:  1) AMS    Plan:  1) Admit      Roanna Raider MD  [facility] Emergency Medicine      Critical Care Time (Attendings)            Roanna Raider, MD  09/21/22 [phone]

## 2022-09-21 NOTE — Progress Notes (Signed)
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 He feels comfortable checking his own at home, if required at discharge.    David Jordan

## 2022-09-21 NOTE — ED Notes (Signed)
Pt returned from imaging at this time.

## 2022-09-21 NOTE — Discharge Instructions (Addendum)
Please see your primary care provider within 1 week of discharge.  Take a copy of these instructions and current medications with you.    If you experience an episode in the future please check your blood sugar.  If it is less than 70 mg/dL eat a snack containing fat/ protein.  Crackers and peanut butter are a good option.      Take all medications as prescribed    If you were prescribed Narcotic pain medication (such as oxycodone, percocet, Norco, hydrocodone) or benzodiazepine (such as Ativan, lorazepam, Valium, diazepam) do not drive or operate machinery    Diet: regular    Weight monitoring: weigh yourself everyday contact your heart doctor if you gain more than 3 pounds overnight or more than 5 pounds in 1 week    If your symptoms worsen or you experience chest pain or shortness of breath please call 911 or go to nearest Emergency Room.    It was a pleasure caring for you at [facility]    Mardene Speak  09/21/2022 12:09 PM

## 2022-09-21 NOTE — Plan of Care (Signed)
Problem: Discharge Planning  Goal: Identify discharge needs  Outcome: Completed       From home with spouse, plans to return. No HHC or O2 prior, denies needs.

## 2022-09-21 NOTE — ED Triage Notes (Signed)
Pt to the ED with c/o being lightheaded.  Pt states when episode starts he feels flushed all over and then a headache.  Pt has episode of nausea and unable to talk or move his body.  States unsure how long episode lasted.  States he has had hallucinations and family states wasn't acting like himself.  States first episode last night.  States was fine all day today and then had another episode tonight.

## 2022-09-21 NOTE — Progress Notes (Signed)
S: Admitted after midnight  Had a couple of episodes of confusion memory loss palpitations and sweating.  MRI echo neurology consult are pending, wife bedside  O  Vitals:    09/21/22 1046   BP: 114/70   Pulse: 81   Resp: 11   Temp: 98.3 F (36.8 C)   SpO2: 95%     Gen: non toxic    A/P  Altered mental status ?  Hypoglycemic episode      Await MRI of head  Await echocardiogram  Await neurology consultation  If above workup is unrevealing we will plan on discharging him home with a glucometer to check his blood sugar when he has these episodes as they do sound like they could be hypoglycemic in nature    Mardene Speak DO  09/21/2022 11:35 AM

## 2022-09-21 NOTE — Procedures (Signed)
CONTINUOUS VIDEO ELECTROENCEPHALOGRAM (cvEEG) REPORT    Identifying Information:  Name: David Jordan  MRN: [id number]  DOB: Jan 18, 1986  Attending Physician: Demetrius Charity, DO  Interpreting Physician: Norwood Levo, MD      Clinical History:  David Jordan is an 37 y.o. male who was admitted on 09/21/2022 with a history of episodic events concerning for possible seizures.    Past Medical History:  History reviewed. No pertinent past medical history.    Current Medications:    heparin  5,000 Units Subcutaneous 3 times per day         cEEG start date & time: 09/21/2022 at 1036  cEEG end date & time: 09/22/2022 at 0700    Indication:  cEEG was initiated for monitoring and localization of seizures as the etiology of the encephalopathy/altered mental status. It was available for review at the bedside as well as via remote access for the entire period of monitoring.      Technical Summary:  21 channels of continuous video EEG were recorded in a digital format on a patient who is reported to be awake/drowsy/asleep during the recording.     The background consists of 9-10 Hz activity in the alpha frequency range. It is symmetric, normal voltage and continuous. A 10 Hz posterior dominant rhythm (PDR) is present. The background is reactive to stimulation. Normal and symmetric sleep architecture is seen.      During the recording:   Video was reviewed intermittently at times when significant amounts of EMG artifact were present.      The EKG monitoring channel did not detect any significant rhythm abnormalities.    EEG Interpretation:   This EEG is normal in the awake, drowsy, and sleep state. No events, electrographic seizures or non-convulsive status epilepticus seen over the entire monitoring period.       Trellis Moment, MD

## 2022-09-21 NOTE — Progress Notes (Signed)
Attempted mri head.  Pt states meds did not work.  Too claustro.  Unable to scan.

## 2022-09-21 NOTE — Consults (Signed)
[facility] Consult Note      Date of visit: September 21, 2022  Patient Name: David Jordan  MRN:  [id number]  DOB:  [birth year]   Chief Complaint:difficulty moving body     History of Present Illness   David Jordan is a 37 y.o. male with no significant past medical history who presented with abnormal spells of moving his body.     Patient reports that about 1 week ago he experienced a 5-10 minute episode where he could not move his right arm. On 6/19 he had another episode which started off as nausea and numbness over his whole body, which progressed to feeling stuck and difficulty moving which lasted about 15-30 minutes. He reportedly was also hallucinating in the evening but has no recollection of this. He again woke up around 2AM in the morning with full body sweats and paresthesias and again could not move. Of note he does report having a headache during these episodes. Pt now back to baseline.        Review of Systems   Review of Systems  + nausea, headaches, numbness, weakness, hallucinations  Negative for vision changes, chest pain, shortness of breath, vomiting  Patient History   History reviewed. No pertinent past medical history.   Past Surgical History:   Procedure Laterality Date    WISDOM TOOTH EXTRACTION        Family History   Problem Relation Age of Onset    Cancer Neg Hx     Diabetes Neg Hx     Heart disease Neg Hx     Kidney disease Neg Hx     Stroke Neg Hx       Social History     Socioeconomic History    Marital status: Married     Spouse name: None    Number of children: None    Years of education: None    Highest education level: None   Occupational History    None   Tobacco Use    Smoking status: Never    Smokeless tobacco: Never   Substance and Sexual Activity    Alcohol use: Yes     Comment: Maybe a few beers per week.     Drug use: Never    Sexual activity: Yes   Other Topics Concern    Caffeine Use Not Asked    Occupational Exposure Not Asked    Exercise Not Asked    Seat Belt Not  Asked   Social History Narrative    None     Social Determinants of Health     Financial Resource Strain: Not on file   Food Insecurity: No Food Insecurity (09/21/2022)    Hunger Vital Sign     Worried About Running Out of Food in the Last Year: Never true     Ran Out of Food in the Last Year: Never true   Transportation Needs: No Transportation Needs (09/21/2022)    PRAPARE - Therapist, art (Medical): No     Lack of Transportation (Non-Medical): No   Physical Activity: Not on file   Stress: Not on file   Social Connections: Not on file   Intimate Partner Violence: Not At Risk (09/21/2022)    Humiliation, Afraid, Rape, and Kick questionnaire     Fear of Current or Ex-Partner: No     Emotionally Abused: No     Physically Abused: No     Sexually Abused: No   Housing  Stability: Unknown (09/21/2022)    Housing Stability Vital Sign     Unable to Pay for Housing in the Last Year: No     Number of Times Moved in the Last Year: Not on file     Homeless in the Last Year: No     Medications and Allergies        Medication List        TAKE these medications, which you were ALREADY TAKING        Quantity/Refills   ibuprofen 200 MG tablet  Commonly known as: MOTRIN  Take 1 tablet (200 mg total) by mouth every 6 hours as needed for Pain.   Refills: 0            STOP taking these medications      naproxen 375 MG tablet  Commonly known as: NAPROSYN            Scheduled Meds:   heparin  5,000 Units Subcutaneous 3 times per day     Continuous Infusions:   sodium chloride 0.9 % 63 mL/hr (09/21/22 0635)     PRN Meds:.acetaminophen, albuterol, LORazepam, ondansetron  Allergies   Allergen Reactions    Codeine Hives and Nausea And Vomiting     Hives and Fever    Shellfish Derived Swelling and Rash     Vitals     Vitals:    09/21/22 0654 09/21/22 0717 09/21/22 0913 09/21/22 1046   BP: 126/81 126/81 138/74 114/70   BP Location:  Right upper arm Right upper arm Left upper arm   Patient Position:  Sitting Lying Lying    BP Cuff Size:  Extra Large     Pulse: 72 82 80 81   Resp: 12 15 11 11    Temp:   98.5 F (36.9 C) 98.3 F (36.8 C)   TempSrc:   Oral Oral   SpO2: 98% 97% 96% 95%   Weight:       Height:         Temp:  [98.3 F (36.8 C)-98.5 F (36.9 C)] 98.3 F (36.8 C)  Heart Rate:  [72-94] 81  Resp:  [11-21] 11  BP: (114-157)/(70-91) 114/70  Wt Readings from Last 3 Encounters:   09/21/22 145 lb 1.6 oz (65.8 kg)   10/11/21 135 lb (61.2 kg)   01/20/20 147 lb (66.7 kg)       Physical Exam   General: Well groomed, well nourished, resting comfortably in no acute distress.    Respiratory: Respirations are even and unlabored on room air, no use of accessory muscles.  Cardiovascular: Regular rate and rhythm. No evidence of peripheral edema.   Gastrointestinal: Abdomen soft, no pain on palpation.  Skin: No rash.    Neurologic Exam   Appearance:   Well groomed, well nourished patient in no acute distress.  Language/Speech:   Fluent language.  No word finding difficulty or paraphasic errors.  Able to name simple objects.  Able to repeat.  Able to follow 2 step commands.  No neglect or gaze preference.  Attention/concentration:   Awake and able to attend to the examiner.  Able to provide a history with memory intact to recent events.  Orientation:   Oriented to self, year, month, day, date, situation.   Fund of Knowledge:   Able to name the current president.    Cranial nerves:      CN II Pupils are equal, round and reactive.   Visual fields intact to finger counting.  CN III,IV,VI Extraocular eye movements intact, full vertical and horizontal duction.     No nystagmus.  Eyes are aligned in primary gaze.  No skew.    CN V Sensation is intact V1-V3 bilaterally to light touch.  Corneal reflex intact.    CN VII Facial movements are symmetric at rest and with activation.  No dysarthria.    CN VIII Hearing is intact to conversational tone.    CN IX & X Soft palate elevates symmetrically.    CN XI Shoulder shrug strength 5/5 bilaterally .     CN XII Tongue protrudes midline.     Motor:   Normal tone.  No drift in UE/LE.  No atrophy or fasciculations.  5/5 strength throughout    Sensation:  Intact to light touch throughout    Coordination:  Normal FNF.  Normal HKS.  Normal rate and amplitude of finger tapping.    Gait:  Deferred    Labs     Lab Results   Component Value Date    GLUCOSE 113 (H) 09/21/2022    BUN 14 09/21/2022    CO2 29 09/21/2022    CREATININE 0.98 09/21/2022    K 3.4 (L) 09/21/2022    NA 140 09/21/2022    CL 103 09/21/2022    CALCIUM 9.0 09/21/2022       Lab Results   Component Value Date    WBC 5.5 09/21/2022    HGB 13.7 09/21/2022    HCT 39.7 09/21/2022    MCV 84.4 09/21/2022    PLT 239 09/21/2022     No results found for: PTT, INR  Lab Results   Component Value Date    CHOLTOT 172 02/04/2020    TRIG 91 02/04/2020    HDL 44 (L) 02/04/2020    LDL 110 02/04/2020     Lab Results   Component Value Date    HGBA1C 5.3 02/04/2020       Lab Results   Component Value Date    COLORU Colorless (A) 09/21/2022    CLARITYU Clear 09/21/2022    PROTEINUA Negative 09/21/2022    PHUR 6.5 09/21/2022    LABSPEC 1.006 09/21/2022    GLUCOSEU Negative 09/21/2022    BLOODU Negative 09/21/2022    LEUKOCYTESUR Negative 09/21/2022    NITRITE Negative 09/21/2022    BILIRUBINUR Negative 09/21/2022    UROBILINOGEN <2.0 09/21/2022       Lab Results   Component Value Date    METHADSCRNUR Negative 09/21/2022    BARBSCRNUR Negative 09/21/2022    BENZOSCRNUR Negative 09/21/2022    OPIATESCRNUR Negative 09/21/2022    THCSCRNUR Negative 09/21/2022    COCAINSCRNUR Negative 09/21/2022       Lab Results   Component Value Date    CKTOTAL 92 09/21/2022     Imaging and Diagnostic Studies     MRI head results:  No results found for the past 24 months  CT head results:  CT Head WO contrast    Result Date: 09/21/2022  IMPRESSION: 1.  No acute intracranial abnormality. 2.  No intracranial mass effect or hemorrhage. Approved by Estanislado Pandy, MD on 09/21/2022 4:50 AM EDT I  have personally reviewed the images and I agree with this report. Report Verified by: Letitia Caul, MD at 09/21/2022 5:06 AM EDT   Brain vessel imaging results:  No results found for the past 12 months  Assessment     Unusual spells of paresthesias and immobility in setting of nausea and headache. Could be  due to atypical form of complicated migraine. Less likely a vascular event as difficult to localize. Events not stereotyped in terms of duration or symptom involvement but seizures still on differential. Behavioral etiologies on differential as well. So far no further events have occurred since hospitalization which is reassuring.     Plan     cEEG monitoring  MRI brain w/wo contrast   If above testing negative, patient/family ok with further workup in the outpatient setting   Neuro checks Q4H  Telemetry  Seizure/fall precautions      I spent 75 minutes, providing care, interpreting results, documenting, and coordinating the patient's care.    I independently reviewed the patient's old medical records, current laboratory results, and radiographic images as outlined in the above note.    I have contacted IM hospitalist for ancillary history to aid in provision of the patient's care.    I have contacted family at bedside to discuss interpretation of test results, coordination of care.    Portions of this note may have been copied forward with edits.  I have reviewed and updated the history, physical exam, data, assessment, and plan of the note so that it reflects my current evaluation and management of the patient on this date.    Karen Chafe, MD  Assistant Professor   [facility] Neu[facility] Consult Service  09/21/2022

## 2022-09-22 ENCOUNTER — Observation Stay: Payer: PRIVATE HEALTH INSURANCE

## 2022-09-22 LAB — LIPID PANEL
Cholesterol, Total: 164 mg/dL (ref 0–200)
HDL: 46 mg/dL — ABNORMAL LOW (ref 60–92)
LDL Cholesterol: 102 mg/dL
Non-HDL Cholesterol, Calculated: 118 mg/dL (ref 0–129)
Triglycerides: 81 mg/dL (ref 10–149)

## 2022-09-22 LAB — PHOSPHORUS: Phosphorus: 3.7 mg/dL (ref 2.1–4.7)

## 2022-09-22 LAB — BASIC METABOLIC PANEL
Anion Gap: 4 mmol/L (ref 3–16)
BUN: 9 mg/dL (ref 7–25)
CO2: 28 mmol/L (ref 21–33)
Calcium: 8.8 mg/dL (ref 8.6–10.3)
Chloride: 107 mmol/L (ref 98–110)
Creatinine: 0.88 mg/dL (ref 0.60–1.30)
EGFR: >90
Glucose: 91 mg/dL (ref 70–100)
Osmolality, Calculated: 286 mOsm/kg (ref 278–305)
Potassium: 3.9 mmol/L (ref 3.5–5.3)
Sodium: 139 mmol/L (ref 133–146)

## 2022-09-22 LAB — MAGNESIUM: Magnesium: 2 mg/dL (ref 1.5–2.5)

## 2022-09-22 MED ORDER — lancets Misc
11 refills
Start: 2022-09-22 — End: 2022-11-07

## 2022-09-22 MED ORDER — blood sugar diagnostic (GLUCOSE BLOOD) Strp
ORAL_STRIP | 3 refills | 30.00000 days
Start: 2022-09-22 — End: 2022-11-07

## 2022-09-22 MED ORDER — lancets Misc
3 refills
Start: 2022-09-22 — End: 2022-11-07

## 2022-09-22 MED ORDER — blood-glucose meter (GLUCOSE MONITORING KIT) kit
PACK | 0 refills | 30.00000 days
Start: 2022-09-22 — End: 2022-11-07

## 2022-09-22 MED FILL — SODIUM CHLORIDE 0.9 % INTRAVENOUS SOLUTION: 63.0000 63.0000 mL/hr | INTRAVENOUS | Qty: 1000

## 2022-09-22 MED FILL — HEPARIN (PORCINE) 5,000 UNIT/ML INJECTION SOLUTION: 5000 5,000 unit/mL | INTRAMUSCULAR | Qty: 1

## 2022-09-22 NOTE — Progress Notes (Signed)
Brief Neurology Note    cEEG negative for seizures or epileptiform discharges. Pt unable to tolerate MRI brain but has not had any further episodes since admission and feels well. Ok to discharge from a neurology perspective and follow up outpatient.     Karen Chafe, MD  Assistant Professor  Department of Neurology and Rehabilitation  09/22/2022

## 2022-09-22 NOTE — Discharge Summary (Signed)
[facility]  Discharge Summary      PATIENT INFORMATION   Patient's PCP:  Venita Sheffield, PA  Name: David Jordan  DOB: 1986/03/08  MRN: [id number]     Admit Date:  09/21/2022    Discharge Date:  09/22/2022    Patient Class:  Observation    ADMITTING / DISCHARGING PHYSICIAN   Admitting Physician:  Demetrius Charity, DO    Discharge Physician:  Mardene Speak DO    HOSPITAL COURSE   Pending Tests at Discharge  none    Incidental Findings Requiring Follow Up  none      Consulting Physicians:  neurology    Brief Hospital Course:   Patient was admitted to the hospital and the following problems were managed:  Altered mental status ?  Hypoglycemic episode     37 year old male was admitted for observation following transient altered mental status episodes where he also feels diaphoretic and has palpitation. Sugar was normal while hospitalized.  He was admitted for observation.  Neurology was consulted for concern for possible seizure activity he was unable to undergo an MRI due to claustrophobia despite Ativan administration.  He underwent continuous EEG without epileptiform or concerns neurology did not feel was necessary to pursue further brain imaging.  Concern he may be having hypoglycemic episodes he was instructed on how to check his sugar and a glucometer was prescribed at discharge should he have further episodes I do recommend him checking his blood sugar.  He will be following up with his primary care physician at discharge.  His echocardiogram showed normal EF and no significant abnormalities        PHYSICAL EXAM   Physical exam on day of hospital discharge:     BP 120/75 (BP Location: Right upper arm, Patient Position: Lying)   Pulse 82   Temp 98.1 F (36.7 C) (Oral)   Resp 11   Ht 5' 9 (1.753 m)   Wt 145 lb (65.8 kg)   SpO2 96%   BMI 21.41 kg/m     General Appearance:  Alert, cooperative, no distress, appears stated age    Head:  Normocephalic, without obvious abnormality,  atraumatic    Lungs:  Clear to auscultation bilaterally, respirations unlabored    Heart:  Regular rate and rhythm, S1 and S2 normal   Abdomen:  Soft, non-tender,    Extremities:  Extremities normal, no edema   Skin:  Warm and dry   Neurologic:  Face symmetric, speech fluent, no tremor          LABS   Labs prior to hospital discharge:  Recent Labs     09/21/22  0412   WBC 5.5   HGB 13.7   HCT 39.7   PLT 239                                                                  Recent Labs     09/21/22  0412 09/22/22  0653   NA 140 139   K 3.4* 3.9   CL 103 107   CO2 29 28   BUN 14 9   CREATININE 0.98 0.88   GLUCOSE 113* 91       SURGERIES       LINES / DRAINS /  AIRWAYS     Patient Lines/Drains/Airways Status       Active Line / PIV Line       Name Placement date Placement time Site Days    Peripheral IV 09/21/22 Right Antecubital 09/21/22  0413  Antecubital  1                    IMAGING STUDIES   Echo 2D Complete (TTE)    Result Date: 09/21/2022                                 Deborah Chalk                          Department of Cardiology*                            [address]                            [place], [place] [address]                                 [phone] Transthoracic Echocardiogram Patient:     Kollyn, Lingafelter      Room:   134        Height: 69in MR Number:   [id number]                DOB:    04/16/1985 Weight: 145lb Account:     [id number]              Gender: M          BP:     114 / 70 Study Date:  09/21/2022              Age:    36         BSA:    1.22m^2 Referring physician:    Shari Heritage Interpreting physician: Lionel December   PERFORMING   [facility]  SONOGRAPHER  Tiana Loft RDCS  ORDERING     [place], Jose  REFERRING    [place], [place]  CONSULTING   [place], Alex  ATTENDING    Donnetta Hutching ---------------------------------------------------------------------------- Procedure:ECHO 2D COMPLETE (TTE)   Order:  Accession Number:[id number] ---------------------------------------------------------------------------- Indications:      (syncope R55). ---------------------------------------------------------------------------- PMH:  No prior cardiac history. ---------------------------------------------------------------------------- Study data:  Height: 69in. 175.3cm. Weight: 145lb. 65.8kg.  Study status: Routine.  Procedure:  A transthoracic echocardiogram was performed. Image quality was good. Scanning was performed from the parasternal, apical, and subcostal acoustic windows.          Transthoracic echocardiogram.  M-mode, complete 2D, complete spectral Doppler, and color Doppler.  Birthdate: Patient birthdate: 1986/03/27.  Age:  Patient is 43year(s) old.  Sex:  Birth gender: male.  Body mass index:  BMI: 21.4kg/m^2.  Body surface area: BSA: 1.45m^2.  Blood pressure:     114/70  Patient status:  Inpatient.  Study date:  Study date: 09/21/2022. Study time: 01:39 PM.  Location:  Echo laboratory. ---------------------------------------------------------------------------- Study Conclusions - Left ventricle: The cavity size is normal. Wall thickness is normal.   Systolic function is normal. The  estimated ejection fraction is 55-60%.   Wall motion is normal; there are no regional wall motion abnormalities.   Left ventricular diastolic function parameters are normal. - Aortic valve: There is mild thickening. - Mitral valve: The annulus is mildly calcified. - Right ventricle: Systolic function is normal by objective interpretation. ---------------------------------------------------------------------------- Cardiac Anatomy Left ventricle: - The cavity size is normal. Wall thickness is normal. Systolic function is   normal. The estimated ejection fraction is 55-60%. Wall motion is normal;   there are no regional wall motion abnormalities. - The transmitral flow pattern is normal. The deceleration time of the early   transmitral  flow velocity is normal. The pulmonary vein flow pattern is   normal. The tissue Doppler parameters are normal. Left ventricular   diastolic function parameters are normal. Aorta: Aortic root: The root is normal in size. Aortic valve: - TrileafletThe leaflets are normal thickness. There is mild thickening.   Mobility is not restricted. Velocity is within the normal range. There is   no stenosis. There is no regurgitation. The mean systolic gradient is 4mm   Hg. The ratio of LVOT to aortic valve peak velocity is 0.8. The ratio of   LVOT to aortic valve mean velocity is 0.8. Mitral valve: - The annulus is mildly calcified. Mobility is not restricted. Inflow   velocity is within the normal range. There is no evidence for stenosis.   There is no regurgitation. The mean diastolic gradient is 2mm Hg. The peak   diastolic gradient is 3mm Hg. Left atrium:  The atrium is normal in size. Pulmonary artery: - The main pulmonary artery is normal-sized. Systolic pressure was within   the normal range. Main pulmonary artery: - Right ventricle: - The cavity size is normal. Wall thickness is normal. Systolic function is   normal by objective interpretation. Pulmonic valve: - Velocity is within the normal range. There is no evidence for stenosis.   There is no regurgitation. Tricuspid valve: - The valve is structurally normal. Inflow velocity is within the normal   range. There is no regurgitation. Right atrium:  The atrium is normal in size. Pericardium: - There is no pericardial effusion. Systemic veins: Inferior vena cava: The IVC is normal-sized. ---------------------------------------------------------------------------- Measurements  Left ventricle            Value          Ref  EDD, LAX              (N) 4.5   cm       4.2 - 5.8  ESD, LAX              (N) 2.6   cm       2.5 - 4.0  EDD/bsa, LAX          (N) 2.5   cm/m^2   2.2 - 3.0  ESD/bsa, LAX          (N) 1.4   cm/m^2   1.3 - 2.1  FS, LAX               (N) 42    %        25 -  43  FS, LAX chord         (N) 42    %        25 - 43  IVS, ED               (N) 0.8   cm  0.6 - 1.0  ESD                   (N) 2.6   cm       2.5 - 4.0  ESD/bsa               (N) 1.4   cm/m^2   1.3 - 2.1  PW, ED                (N) 0.9   cm       0.6 - 1.0  IVS/PW, ED                0.89           ---------  EDV                   (N) 92    ml       62 - 150  ESV                   (N) 25    ml       21 - 61  EF                    (H) 73    %        52 - 72  EDV/bsa               (N) 51    ml/m^2   34 - 74  ESV/bsa               (N) 14    ml/m^2   11 - 31  SV, 1-p A2C               68    ml       ---------  SV/bsa, 1-p A2C           37.7  ml/m^2   ---------  SV, 1-p A4C               74    ml       ---------  SV/bsa, 1-p A4C           41    ml/m^2   ---------  E', lat ann, TDI      (N) 19.3  cm/sec   >=10.0  E/e', lat ann, TDI    (N) 5              <=13  E', med ann, TDI      (N) 12.0  cm/sec   >=7.0  E/e', med ann, TDI        8              ---------  E', avg, TDI              15.7  cm/sec   ---------  E/e', avg, TDI        (N) 6              <=14   LVOT                      Value          Ref  Peak vel, S               1.09  m/sec    ---------  Mean vel, S  0.81  m/sec    ---------   Right ventricle           Value          Ref  EDD, LAX                  2.9   cm       ---------  TAPSE, MM             (N) 3.3   cm       >=1.7  S' lateral            (N) 19.9  cm/sec   >=9.5   Left atrium               Value          Ref  AP dim, ES            (N) 3.0   cm       3.0 - 4.0  AP dim index, ES      (N) 1.7   cm/m^2   1.5 - 2.3  Area ES, A4C          (N) 8     cm^2     <=20  Area/bsa ES, A4C          4.26  cm^2/m^2 ---------  SI dim, A2C               4.4   cm       ---------  Vol, S                (N) 20    ml       18 - 58  Vol/bsa, S            (L) 11    ml/m^2   16 - 34  Vol, ES, 1-p A4C      (L) 12    ml       18 - 58  Vol/bsa, ES, 1-p A4C  (L) 6     ml/m^2   12 - 37  Vol, ES, 1-p A2C      (N) 31     ml       18 - 58  Vol/bsa, ES, 1-p A2C  (N) 17    ml/m^2   11 - 43  Vol, ES, 2-p              20    ml       ---------  Vol/bsa, ES, 2-p      (L) 11    ml/m^2   16 - 34   Right atrium              Value          Ref  Area, ES, A4C         (N) 10    cm^2     10 - 18   Aortic valve              Value          Ref  Peak v, S                 1.4   m/sec    ---------  Mean v, S                 1.01  m/sec    ---------  Mean grad, S  4     mm Hg    ---------  LVOT/AV, Vpeak ratio      0.8            ---------  LVOT/AV, Vmean ratio      0.8            ---------   Mitral valve              Value          Ref  Mean v, D                 0.65  m/sec    ---------  Peak E                    0.93  m/sec    ---------  Peak A                    0.61  m/sec    ---------  VTI leaflet coapt         25.2  cm       ---------  Decel time                183   ms       ---------  Mean grad, D              2     mm Hg    ---------  Peak grad, D              3     mm Hg    ---------  Peak E/A ratio            1.5            ---------  E-VTI                     25.2  cm       ---------  A-VTI                     25.2  cm       ---------  VTI E/A                   1.0            ---------  Ann VTI                   25.2  cm       ---------   Pulmonic valve            Value          Ref  Peak v, S                 1.2   m/sec    ---------  Mean vel, S               0.84  m/sec    ---------  Accel time                165   ms       ---------   Aortic root               Value          Ref  Root diam             (N) 3.0   cm       2.5 - 3.5  Root  diam/bsa             1.7   cm/m^2   ---------   Main pulmonary artery     Value          Ref  Mean grad                 3     mm Hg    ---------   Inferior vena cava        Value          Ref  Diam                  (N) 1.3   cm       <=2.1 Legend: (L)  and  (H)  mark values outside specified reference range. (N)  marks values inside specified reference range. Reviewed and confirmed by Lionel December 2024-06-20T14:51:38    CT Head WO contrast    Result Date: 09/21/2022  EXAM: CT HEAD WO CONTRAST INDICATION: Lightheadedness. Mental status change, unknown cause. TECHNIQUE: Axial thin section CT images of the head were obtained without contrast. Sagittal and coronal 2-D multiplanar reconstructions were performed at the scanner. COMPARISON: None available. FINDINGS: Adequate diagnostic quality. Brain parenchyma: Normal brain attenuation. No intra-axial hemorrhage or mass effect. Ventricles and extraaxial spaces: Normal ventricular system. No extra-axial fluid collection. Orbits, paranasal sinuses, mastoids: No acute orbital abnormality. Clear paranasal sinuses. Clear mastoid air cells. Extracranial soft tissues: Normal. Calvarium and skull base: No fracture or suspicious osseous lesion.     IMPRESSION: 1.  No acute intracranial abnormality. 2.  No intracranial mass effect or hemorrhage. Approved by Estanislado Pandy, MD on 09/21/2022 4:50 AM EDT I have personally reviewed the images and I agree with this report. Report Verified by: Letitia Caul, MD at 09/21/2022 5:06 AM EDT    X-ray Chest PA and Lateral    Result Date: 09/21/2022  EXAM: XR CHEST PA AND LATERAL INDICATION: weakness TECHNIQUE: 2 views of the chest. COMPARISON: None. FINDINGS: Medical Devices: None. Heart and Mediastinum: Cardiomediastinal silhouette is within normal limits. Lungs and Pleura: Lungs are clear with no focal consolidations, pleural effusions or evidence for pneumothorax. Bones and Soft tissues: No acute abnormalities.     IMPRESSION: No acute cardiopulmonary abnormality. Approved by Estanislado Pandy, MD on 09/21/2022 4:44 AM EDT I have personally reviewed the images and I agree with this report. Report Verified by: Letitia Caul, MD at 09/21/2022 5:04 AM EDT       OTHER PROCEDURES       ALLERGIES   Codeine  Shellfish Derived    DISCHARGE MEDICATIONS     Current Discharge Medication List        START taking these medications    Details    blood sugar diagnostic (GLUCOSE BLOOD) Strp Use to test blood sugar 1 times a day. Dx: hypoglycemia . Brand per pharmacy / insurance preference.  Qty: 100 strip, Refills: 3      blood-glucose meter (GLUCOSE MONITORING KIT) kit Use to test blood sugar 1 times a day. Dx: hypoglycemia . Brand per pharmacy / insurance preference.  Qty: 1 kit, Refills: 0      !! lancets Misc Use to test blood sugar 1 times a day. Dx: hypoglycemia . Brand per pharmacy / insurance preference.  Qty: 100 each, Refills: 3      !! lancets Misc Use to test blood sugar 1 times a day. Dx: hypoglycemia . Brand per pharmacy / insurance preference.  Qty: 120  each, Refills: 11       !! - Potential duplicate medications found. Please discuss with provider.        CONTINUE these medications which have NOT CHANGED    Details   ibuprofen (MOTRIN) 200 MG tablet Take 1 tablet (200 mg total) by mouth every 6 hours as needed for Pain.           STOP taking these medications       naproxen (NAPROSYN) 375 MG tablet Comments:   Reason for Stopping:               DISPOSITION   home    DISCHARGE INSTRUCTIONS   Activity: activity as tolerated  Diet: regular diet    FOLLOW-UP   Call and schedule an appointment with your Primary Care Physician Venita Sheffield, PA within 1 week of discharge.  Venita Sheffield, PA  [address]  [place] [address]  [phone]    Follow up        No future appointments.    Total time spent on this discharge was 35 minutes including time spent with the patient, reviewing data, completing medical records, and communication with the patient.    Thank you for the opportunity to be involved in your patient's care.   Electronically signed,  Mardene Speak DO  09/22/2022  1:27 PM  [facility]

## 2022-09-22 NOTE — Procedures (Signed)
CONTINUOUS VIDEO ELECTROENCEPHALOGRAM (cvEEG) REPORT    Identifying Information:  Name: [name]  MRN: [id number]  DOB: April 18, 1985  Attending Physician: Demetrius Charity, DO  Interpreting Physician: Norwood Levo, MD      Clinical History:  David Jordan is an 37 y.o. male who was admitted on 09/21/2022 with a history of episodic events concerning for possible seizures.    Past Medical History:  History reviewed. No pertinent past medical history.    Current Medications:    heparin  5,000 Units Subcutaneous 3 times per day         cEEG start date & time: 09/22/2022 at 0700  cEEG end date & time: 09/22/2022 at 1212    Indication:  cEEG was initiated for monitoring and localization of seizures as the etiology of the encephalopathy/altered mental status. It was available for review at the bedside as well as via remote access for the entire period of monitoring.      Technical Summary:  21 channels of continuous video EEG were recorded in a digital format on a patient who is reported to be awake/drowsy/asleep during the recording.     The background consists of 9-10 Hz activity in the alpha frequency range. It is symmetric, normal voltage and continuous. A 10 Hz posterior dominant rhythm (PDR) is present. The background is reactive to stimulation. Normal and symmetric sleep architecture is seen.      During the recording:   Video was reviewed intermittently at times when significant amounts of EMG artifact were present.      The EKG monitoring channel did not detect any significant rhythm abnormalities.    EEG Interpretation:   This EEG is normal in the awake, drowsy, and sleep state. No events, electrographic seizures or non-convulsive status epilepticus seen over the entire monitoring period.       I reviewed and updated the EEG report. I made changes in any findings that differed from the prior note. Findings unchanged from prior EEG were left unchanged in the note.       Trellis Moment, MD

## 2022-09-22 NOTE — Other (Signed)
[place]    Case Manager/Social Worker Discharge Summary     Patient name: David Jordan                                        Patient MRN: [id number]  DOB: [birth year]                              Age: 37 y.o.              Gender: male  Patient emergency contact: Extended Emergency Contact Information  Primary Emergency Contact: Meinecke,Amanda  Mobile Phone: [phone]  Relation: Spouse  Preferred language: English  Interpreter needed? No      Attending provider: Demetrius Charity, DO  Primary care physician: Venita Sheffield, PA    The MD has indicated that the patient is ready for discharge. CM met with patient yesterday.   AVS reviewed; no case management needs noted.    Transfer Mode/Level of Care: Family              The plan has been reviewed: yes    Patient/Family Informed of Discharge Plan: Yes    Plan Reviewed With Patient, Family, or Significant Other: Yes    Patient and or family are aware and in agreement with the discharge plan: Yes             Plan reviewed with MD and other members of the health care team: Yes  Care Plan Completed: Yes    No further CM/SW needs.    This plan has been reviewed with the multi-disciplinary team.     Treatment Preferences    Treatment Preferences: Other (provide comment) (denies needs)    Post-Discharge Goals    Patient's Post-Discharge goals: return home with family

## 2022-09-22 NOTE — Progress Notes (Signed)
Patient name: David Jordan  Mrn: [id number]  Dob: Aug 07, 1985    Religious Preference: None    Patient Objectives: Spiritual Care Assessment    Have Patient Objectives been achieved: Yes    Assessment: David Jordan was seen by Caring Companion [name] to introduce spiritual care and offer support.    09/22/2022 4:16 PM   Volunteer Visit    Care Provided: Declined  Time spent (minutes): 5 minutes      [facility] [name]  Staff Chaplain  [phone]  09/22/2022 4:16 PM     Chaplain available on-site 8am-4:30pm M-F at [phone]. For support outside of those hours, please contact on-call chaplain on [employer].

## 2022-09-22 NOTE — Nursing Note (Signed)
Pt discharged to home with family, instructions read to pt, pt verbalized understanding . Tele/IV removed.

## 2022-10-11 ENCOUNTER — Ambulatory Visit: Admit: 2022-10-11 | Discharge: 2022-10-11 | Payer: PRIVATE HEALTH INSURANCE

## 2022-10-11 DIAGNOSIS — R519 Headache, unspecified: Secondary | ICD-10-CM

## 2022-10-11 NOTE — Progress Notes (Signed)
[facility] [place]  [facility]  [address]  [name] [address]    Name:  David Jordan Date of Birth: [birth year] (37 y.o.)   MRN: [id number]    Date of Service:  10/11/2022   11:43 AM      Subjective   Chief Complaint:     Chief Complaint   Patient presents with    Hospital Discharge Follow Up     The patient is here for a hospital discharge follow-up.       History of Present Illness:   David Jordan is a(n) 37 y.o. male here today for the following:     HPI    About a week before going to the hospital he laid down in bed and he could not feel his right arm at all. Absolutely no sensation in his right arm. This episode lasted maybe 5-6 minutes, then completely abated on it's own - just full sensation back.     Week after that had full body flush and went to the restroom and thought he was going to throw up Suddenly stuck and unable to move.    Two days after that, happened again in the middle of the night. Woke up feeling like he was going to vomit. Body sweats. Unable to move any limbs. Finally subsided, woke up his wife, and went to hospital.       After these episodes, getting insane headaches and did not have headaches prior to these paralysis episodes. Will suddenly be forgetful - like a switch flipped and cannot recall much of anything. Will get somewhere and he doesn't know how they got there. Episode of full body paralysis he was hallucinating and talking to himself, muttering to himself. This lasted for hours. Awake but didn't remember anything. Eyes were darting like he was drunk. Couldn't follow her finger. Couldn't walk straight. For hours after these episodes he doesn't necessarily remember things. He has never lost his phone before, and had to find it recently because he had no idea where it was and couldn't remember where he had been that day to look for it.     When he was hallucinating he would sometimes make full sentences, but  didn't make sense. He would say we've been talking about it for the past hour and they hadn't been talking about anything. Then muttering and I just told you, we just talked about this. Was getting giggly at times but then would go back to sleep. But would also get frustrated insisting they had just talked about these things.     Told in ER to brace for the worst because these symptoms typically end badly.     Headaches have not improved at all. Has had rare sinus headaches in the past, but these are his entire head hurts like an extreme pressure. When he remembers to take medication, he will take ibuprofen or acetaminophen, which helps, but doesn't fully resolve it.     Glucose was a tiny bit high in ER, and sodium was a bit low. But otherwise CMP stable.     No changes in schedule, diet, hydration. Was told potassium and vitamin D were low.     Tried to do MRI brain, but he got significantly claustrophobic, and was unable to stay in the machine.       No future appointments.      Few hours after the headaches have happened at least twice, but without him  knowing if his body went numb, etc.       Has several times lifted very heavy water jugs that weigh over 40 pounds and moved them and didn't feel the strain of his arms - could have been lifting a feather and felt no differently.       Neuro did call him to schedule, but admitted that they were unsure what the follow up would accomplish.       Current Outpatient Medications:  Current Outpatient Medications   Medication Sig Dispense Refill    blood sugar diagnostic (GLUCOSE BLOOD) Strp Use to test blood sugar 1 times a day. Dx: hypoglycemia . Brand per pharmacy / insurance preference. 100 strip 3    blood-glucose meter (GLUCOSE MONITORING KIT) kit Use to test blood sugar 1 times a day. Dx: hypoglycemia . Brand per pharmacy / insurance preference. 1 kit 0    ibuprofen (MOTRIN) 200 MG tablet Take 1 tablet (200 mg total) by mouth every 6 hours as needed for Pain.       lancets Misc Use to test blood sugar 1 times a day. Dx: hypoglycemia . Brand per pharmacy / insurance preference. 100 each 3    lancets Misc Use to test blood sugar 1 times a day. Dx: hypoglycemia . Brand per pharmacy / insurance preference. 120 each 11     No current facility-administered medications for this visit.         ROS:   Review of Systems  See HPI.       Objective:     Vitals:    10/11/22 1123   BP: 100/72   Pulse: 67   SpO2: 99%   Weight: 142 lb (64.4 kg)     Body mass index is 20.97 kg/m.    Physical Exam    Physical Exam   Constitutional: He is oriented to person, place, and time. He appears well-developed and well-nourished. No distress.   HENT:   Head: Normocephalic and atraumatic.   Right Ear: External ear normal.   Left Ear: External ear normal.   Nose: Nose normal.    Eyes: Conjunctivae normal and EOM are normal. Pupils are equal, round.  Neck: Normal range of motion. Neck supple.   Musculoskeletal: Normal range of motion.   Neurological: He is alert and oriented to person, place, and time. He exhibits normal muscle tone. Coordination normal.   Skin: Skin is warm and dry. No rash noted. He is not diaphoretic. No erythema. No pallor.   Psychiatric: He has a normal mood and affect. His behavior is normal. Judgment and thought content normal.            Assessment/Plan:   There are no diagnoses linked to this encounter.    No follow-ups on file.     1.)     Off the next two weeks, after which he would need short-term. Did warn work.     At this point I would not recommend driving or working as these episodes occur quickly with no control during the duration of the episode.       Have reached out to one of the neuro team to discuss differential, desired workup eval, timeline to be seen by neuro.       Number and Complexity of Problems Addressed  1 acute or chronic illness or injury that poses a threat to life or bodily function    Amount and/or Complexity of Data to be Reviewed and Analyzed  3+  unique tests  ordered  Independent interpretation of a test performed by another physician/other qualified health care professional (not separately reported)  Discussion of management or test interpretation with external physician/other qualified health care professional/appropriate source (not separately reported)    Risk of Complications and/or Morbidity or Mortality of Patient Management  Minimal          11:43 AM       Venita Sheffield, PA

## 2022-10-16 ENCOUNTER — Ambulatory Visit: Admit: 2022-10-16 | Discharge: 2022-10-20 | Payer: PRIVATE HEALTH INSURANCE | Attending: Neurology

## 2022-10-16 ENCOUNTER — Other Ambulatory Visit: Admit: 2022-10-16 | Payer: PRIVATE HEALTH INSURANCE

## 2022-10-16 DIAGNOSIS — G43E19 Chronic migraine with aura, intractable, without status migrainosus: Secondary | ICD-10-CM

## 2022-10-16 DIAGNOSIS — R519 Headache, unspecified: Secondary | ICD-10-CM

## 2022-10-16 LAB — LYME DISEASE, IGG, IGM WESTERN BLOT
Lyme 18 kD IgG: ABSENT
Lyme 23 kD IgG: ABSENT
Lyme 23 kD IgM: ABSENT
Lyme 28 kD IgG: ABSENT
Lyme 30 kD IgG: ABSENT
Lyme 39 kD IgG: ABSENT
Lyme 39 kD IgM: ABSENT
Lyme 41 kD IgG: ABSENT
Lyme 41 kD IgM: ABSENT
Lyme 45 kD IgG: ABSENT
Lyme 58 kD IgG: ABSENT
Lyme 66 kD IgG: ABSENT
Lyme 93 kD IgG: ABSENT
Lyme IgG Wb: NEGATIVE
Lyme IgM Wb: NEGATIVE

## 2022-10-16 LAB — IRON STUDIES
% Iron Saturation: 35.7 % (ref 15.0–55.0)
Iron: 119 ug/dL (ref 50–212)
TIBC: 333 ug/dL (ref 261–462)

## 2022-10-16 LAB — VITAMIN A: Vitamin A: 35.4 ug/dL (ref 18.9–57.3)

## 2022-10-16 LAB — CALCIUM: Calcium: 9.4 mg/dL (ref 8.6–10.3)

## 2022-10-16 LAB — FOLATE: Folic Acid: 17.5 ng/mL (ref 5.90–24.80)

## 2022-10-16 LAB — VITAMIN B6: Vitamin B6: 29.4 ug/L (ref 3.4–65.2)

## 2022-10-16 LAB — VITAMIN D 25 HYDROXY: Vit D, 25-Hydroxy: 30.4 ng/mL (ref 30.0–100.0)

## 2022-10-16 LAB — SPOTTED FEVER ANTIBODIES
Spotted Fever Group IgG: <1:64 {titer}
Spotted Fever Group IgM: <1:64 {titer}

## 2022-10-16 LAB — VITAMIN B12: Vitamin B-12: 190 pg/mL (ref 180–914)

## 2022-10-16 LAB — VITAMIN B1, WHOLE BLOOD: Vit. B1, Whole Blood: 115.2 nmol/L (ref 66.5–200.0)

## 2022-10-16 LAB — MAGNESIUM: Magnesium: 2.1 mg/dL (ref 1.5–2.5)

## 2022-10-16 LAB — ZINC: Zinc: 59 ug/dL (ref 44–115)

## 2022-10-16 MED ORDER — atogepant (QULIPTA) 60 mg Tab
60 | ORAL_TABLET | Freq: Every day | ORAL | 5 refills | Status: AC
Start: 2022-10-16 — End: 2023-04-14
  Filled 2022-10-17: qty 30, 30d supply, fill #0

## 2022-10-16 MED ORDER — methylPREDNISolone (MEDROL, PAK,) 4 mg tablet
4 | ORAL | 0 refills
Start: 2022-10-16 — End: 2022-10-25

## 2022-10-16 NOTE — Addendum Note (Signed)
Addended by: Jerelene Redden on: 10/16/2022 02:31 PM     Modules accepted: Orders

## 2022-10-16 NOTE — Progress Notes (Signed)
Patient: David Jordan  Date of Birth: [birth year]  MRN: [id number]           General Neurology Consultation Note    Chief Complaint   Patient presents with    Memory Loss    Headache     Patient is accompanied by his wife.    HPI     David Jordan is a 37 y.o. R-handed male who presents to [facility] for evaluation of altered mental status. Referred by Karen Chafe, MD.      First one - September 13, 2022.    About a week before going to the hospital ([facility] ED 09/21/22 - 09/22/22) he laid down in bed and he could not feel his right arm at all. Absolutely no sensation in his right arm. This episode lasted maybe 5-6 minutes, then completely abated on it's own - just full sensation back.      Week after that had full body flush and went to the restroom and thought he was going to throw up Suddenly stuck and unable to move.     Per PCP note after ED discharge:  Two days after that, happened again in the middle of the night. Woke up feeling like he was going to vomit. Body sweats. Unable to move any limbs. Finally subsided, woke up his wife, and went to hospital.       After these episodes, getting insane headaches and did not have headaches prior to these paralysis episodes. Will suddenly be forgetful - like a switch flipped and cannot recall much of anything. Will get somewhere and he doesn't know how they got there. Episode of full body paralysis he was hallucinating and talking to himself, muttering to himself. This lasted for hours. Awake but didn't remember anything. Eyes were darting like he was drunk. Couldn't follow her finger. Couldn't walk straight. For hours after these episodes he doesn't necessarily remember things. He has never lost his phone before, and had to find it recently because he had no idea where it was and couldn't remember where he had been that day to look for it.      When he was hallucinating he would sometimes make full  sentences, but didn't make sense. He would say we've been talking about it for the past hour and they hadn't been talking about anything. Then muttering and I just told you, we just talked about this. Was getting giggly at times but then would go back to sleep. But would also get frustrated insisting they had just talked about these things.      Headaches have not improved at all. Has had rare sinus headaches in the past, but these are his entire head hurts like an extreme pressure. When he remembers to take medication, he will take ibuprofen or acetaminophen, which helps, but doesn't fully resolve it.      No changes in schedule, diet, hydration. Was told potassium and vitamin D were low.      Few hours after the headaches have happened at least twice, but without him knowing if his body went numb, etc.    Headache  Prior to episode, has 30 seconds:  Full body flush  Nausea    After warning, he gets extreme head pain. Then he cannot move or feel his arms.  One of the episodes, he had full body affected.    Following the episode:  He gets hallucinations and amnesia  Duration 2 hours  Major episodes last about 2-3 hours.    Alleviated by sleep, rest.    Started ibuprofen 200 mg as needed - no benefit    Tried to do MRI brain, but he got significantly claustrophobic, and was unable to stay in the machine.          10/14/2022    11:11 AM   AMB NEURO HEADACHE QUESTIONNAIRE   At what age (year) did you first start getting headaches? 36   Which part of the head hurts when you get the headache? (Example: temple, frontal...etc.) Starts in back around neck and moves to how head very quickly   What does the headache pain feel like? (e.g sharp, stabbing, throbbing, dull ache...etc.) Sharp pain   How many days in a month do you have headaches, this includes ALL types of headaches?  15   How many days in a month do you have headaches that affect your abillity to function?  15   How long does each headache last? (can  provide a range) All day but dulls down a lot when taking ibuprofen   How severe are your headaches on a scale of 1-10 with 10 being the most severe pain you ever experienced? (can provide a range) 3-5 with ibuprofen, 8 when first waking up or when i have exceded the recommended timing on ibuprofen   Sensitivity to light? Yes   Sensitivity to noise? Yes   Sensitivity to smells? (e.g. chemicals, perfume) Yes   Sensitivity to movement? No   Nausea? Yes   Vomiting? No   Visual disurbances? (e.g. seeing flashing lights, spots, zigzag lines...etc.) Yes   Tingling or numbness? Yes   Difficulty speaking? No   Poor concentration? Yes   Dizziness? No   Weakness on one side of the body? Yes   Runny nose or congestion on one side, the same side of the headache? No   Tearing, eye redness, or eyelid droop on one side, the same side of the headache?  No   Double vision? No   Complete loss of vision in both eyes for a few seconds? No   What do you prefer to do when you have a headache? (e.g. lie down in a dark quiet room, stay active, ice packs, heating pads...etc.) I try to stay as active as i can, but when that doesnt work i will lay down, normally end up sleeping a few hours   Does your headache significantly change (better or worse) if you lie down as opposed to standing? Better   Does coughing, sneezing, or straining significantly affect the severity of your headache? Does not affect my headaches   Do you know what triggers your headaches? If so, please explain.  No, never had headaches until these started a few weeks ago   Difficulty falling asleep? No   Difficulty staying asleep? No   Snoring? No   Excessive sleepiness during the day? No   Tooth grinding or clenching? No   Chronic neck pain? No   Have you had any major increase in stress recently? (e.g. significant life event, death in the family...etc.). If so, please explain. No   Does anyone in your family have history of migraine or frequent headaches? If so, who? No    Medication 1 (specify medication name, dose, frequency, side effects (if any), and whether the medication helped): Ibuprofen 200mg  every 6 hours or longer depending on when I sleep   Medication 2 (specify medication name, dose, frequency, side effects (if  any), and whether the medication helped): Acetaminophen 500 only between the ibuprofen when it is not dulling it enogh   Medication 1 (specify medication name, dose, frequency, side effects (if any), and whether the medication helped): No     STOP-BANG Score for OSA:  High risk: 5-8   Intermediate risk: 3-4    Low risk 0-2       10/14/2022    11:00 AM   Stop Bang Questionnaire   Does the patient snore loudly (louder than talking or loud enough to be heard through closed doors)? 0   Do you often feel tired, fatigued, or sleepy during daytime? 1   Has anyone observed you stop breathing during your sleep? 0   Do you have or are you being treated for high blood pressure? 0   Is your BMI more than 35 kg/m2? 0   Age over 62 yr old? 0   Neck circumference greater than 16in/40 cm? 0   Gender male? 1   Total Score 2     Montreal Cognitive Assesment  Montreal Cognitive Assessment (MoCA)   Alternate Trail Marking : 1  Visuoconstructional Skills (Cube): 0  Visuoconstructional Skills (Clock)  Contour:: 1  Numbers:: 1  Hands:: 1  Visuospatial/Executive Total:: 4     Naming  Lion:: 1  Rhinoceros:: 1  Camel:: 1  Naming Total:: 3  Memory  1st Trial: Face, Velvet, [place], [place], [employer]  2nd Trial: Face, Velvet, Daisy, Red  Attention  Forward Digit Span:: 1  Backward Digit Span:: 1  Vigilance:: 1  Serial 7's: 93  Serial 7's Total:: 1  Attention Score:: 4  Language  Sentence Repetition:: 2  Verbal Fluency:: 1  Language Total Score: 3  Abstraction  Similarities:: 2  Delayed Recall:  Delayed Recall Total:: 2  Orientation:  Date:: 1  Month:: 1  Year:: 1  Day:: 1  Place:: 1  City:: 1  Orientation Total: 6  Add 1 point if education is less than or equal to 12 years:: 0  Total Score::  24      10-13-2022 IM[facility]     ASSESSMENT:      ICD-10-CM    1. Intractable chronic migraine with aura and without status migrainosus  G43.E19 atogepant (QULIPTA) 60 mg Tab     methylPREDNISolone (MEDROL, PAK,) 4 mg tablet      2. Cognitive and behavioral changes  R41.89     R46.89         David Jordan is a 37 y.o. male with no significant past medical history presents after having cognitive/behavioral changes in early June 2024 which started abruptly along with acute headaches. His headaches vary in intensity but are daily, constant. ED admission demonstrated that cEEG was negative for seizures.  CT Head was negative.  Overall, symptoms are slightly improving.  His symptoms of spells of paresthesias, immobility, nausea, and headache could represent migraines and would benefit from [place] as they are occurring daily.    PLAN:    Orders Placed This Encounter    atogepant (QULIPTA) 60 mg Tab    methylPREDNISolone (MEDROL, PAK,) 4 mg tablet     Headache education:  Discussed pathophysiology of headache.    Discussed use of headache diary.    Discussed triggers and lifestyle modifications including adequate fluid intake without caffeine, routine exercise, not skipping meals, and a consistent sleep wake cycle.  The patient was given a list of common headache triggers and instructed on how to keep a headache diary.  Instructed patient about medications.    Discussed potential adverse effects and drug interactions of medications.    Discussed medication overuse headache and to limit use of analgesics to less than 2 doses per week.      Preventive medication recommended: Qulipta.       Abortive medication recommended: Medrol dose pack, ibuprofen        Common side effects of the prescribed medications discussed with patient      Tests or labs ordered: none    Recommended to follow up with PCP for general medical issues.    Patient verbalized understanding of the plan and encouraged to call my office at  [phone] for further questions and concerns.    Return in 3 months (on 01/16/2023).         Subjective     Other Indicators of Depression/Anxiety Severity:  PHQ-9 Scores:        No data to display              GAD7 Scores:       10/14/2022    10:52 AM   GAD7Total Score   GAD-7 Total Score 5     Histories:     He has a past medical history of Bilateral arm weakness, Headache, and Memory loss.    He has a past surgical history that includes Wisdom tooth extraction and pr eeg phys/qhp 2-12 hr with veeg (09/22/2022).    His family history includes Diabetes in his father; Heart disease in his father.    He reports that he has never smoked. He has never used smokeless tobacco. He reports that he does not currently use alcohol. He reports that he does not use drugs.   Marital status: Married.    Employed: [employer], Art gallery manager in lab.  7 years of service in [place]; stationed in [place], [place].    Allergies:   Codeine and Shellfish derived    Medications:     Outpatient Medications Prior to Visit   Medication Sig Dispense Refill    blood sugar diagnostic (GLUCOSE BLOOD) Strp Use to test blood sugar 1 times a day. Dx: hypoglycemia . Brand per pharmacy / insurance preference. 100 strip 3    blood-glucose meter (GLUCOSE MONITORING KIT) kit Use to test blood sugar 1 times a day. Dx: hypoglycemia . Brand per pharmacy / insurance preference. 1 kit 0    ibuprofen (MOTRIN) 200 MG tablet Take 1 tablet (200 mg total) by mouth every 6 hours as needed for Pain.      lancets Misc Use to test blood sugar 1 times a day. Dx: hypoglycemia . Brand per pharmacy / insurance preference. 100 each 3    lancets Misc Use to test blood sugar 1 times a day. Dx: hypoglycemia . Brand per pharmacy / insurance preference. 120 each 11     No facility-administered medications prior to visit.        Review of Systems  Answers submitted by the patient for this visit:  Review of Systems (Submitted on 10/14/2022)  Activity Change: No  Appetite change: No  Chills:  Yes  Diaphoresis (Excessive Sweating): No  Fatigue: Yes  Fever: No  Unexpected Weight Change: No  Facial Swelling: No  Neck Pain: No  Neck Stiffness: No  Ear Discharge: No  Hearing Loss: No  Ear Pain: No  Tinnitus (ringing in ears): No  Nosebleeds: No  Congestion: No  Rhinorrhea (Excessive discharge from the nose): No  Postnasal Drip: No  Sneezing: No  Sinus Pressure: No  Dental Problems: No  Drooling: No  Mouth Sores: No  Sore Throat: No  Trouble Swallowing: No  Voice Change: No  Eye Discharge: No  Eye Itching: No  Eye Pain: No  Eye Redness: No  Photophobia (Sensitivity to Light): No  Visual Disturbance: No  Apnea: No  Chest Tightness: No  Choking: No  Cough: No  Shortness of Breath: No  Stridor (High pitched wheezing): No  Wheezing: No  Chest Pain: No  Leg Swelling: No  Palpitations (Abnormal heartbeat): No  Abominal distention (Abdominal Swelling): No  Abdominal Pain: No  Anal Bleeding: No  Blood in Stool: No  Constipation: No  Diarrhea: No  Nausea: No  Rectal Pain: No  Vomiting: No  Cold Intolerance: No  Heat Intolerance: No  Polydipsia (excessive thirst): No  Polyphagia (increased appetite): No  Polyuria (frequent urination): No  Difficulty urinating: No  Dysuria (Pain with Urination): No  Enuresis (Urinary incontinence): No  Flank pain (Pain on one side between upper abdomen and back): No  Frequency (urine): No  Genital sore: No  Hematuria (blood in urine): No  Urgency (urine): No  Urine decreased: No  Arthralgias (Joint pain): No  Back pain: No  Gait problem (Problems walking): No  Joint swelling: No  Myalgias (Muscle Pain): No  Color change: No  Pallor (Pale skin): No  Rash : No  Wound: No  Environmental allergies: Yes  Food Allergies : Yes  Immunocompromised: No  Dizziness: Yes  Facial asymmetry: No  Headaches : Yes  Light-headedness: Yes  Numbness: Yes  Seizures: No  Speech difficulty: No  Syncope (fainting): No  Tremors: No  Weakness: Yes  Adenopathy (swollen lymph): No  Bruises/bleeds easily:  No  Agitation: No  Behavior problem: No  Confusion: Yes  Decreased concentration: Yes  Dysphoric mood (unpleasant mood): No  Hallucinations: No  Hyperactive: No  Nervous/anxious: Yes  Self-injury: No  Sleep disturbance: No  Suicidal ideas: No    The following portions of the patient's history were reviewed and updated as appropriate:  allergies, current medications, past family history, past medical history, past social history, past surgical history, problem list and review of systems.    Objective    BP 133/81 (BP Location: Left upper arm, Patient Position: Sitting, BP Cuff Size: Regular)   Pulse 67   Ht 5' 9 (1.753 m)   Wt 142 lb (64.4 kg)   SpO2 100%   BMI 20.97 kg/m   Wt Readings from Last 3 Encounters:   10/16/22 142 lb (64.4 kg)   10/11/22 142 lb (64.4 kg)   09/21/22 145 lb (65.8 kg)       Neurologic Exam     Mental Status   Oriented to person, place, and time.   Speech: speech is normal and not slurred   Level of consciousness: alert    Cranial Nerves     CN III, IV, VI   Pupils are equal, round, and reactive to light.  Extraocular motions are normal.   Nystagmus: none     CN V   Facial sensation intact.     CN VII   Facial expression full, symmetric.     CN VIII   CN VIII normal.     CN IX, X   CN IX normal.   CN X normal.     CN XI   CN XI normal.     CN XII   CN XII normal.     Motor Exam  Muscle bulk: normal  Overall muscle tone: normal    Strength   Strength 5/5 except as noted.     Sensory Exam   Light touch normal.     Gait, Coordination, and Reflexes     Gait  Gait: normal    Coordination   Finger to nose coordination: normal    Reflexes   Right brachioradialis: 2+  Left brachioradialis: 2+  Right biceps: 2+  Left biceps: 2+  Right triceps: 2+  Left triceps: 2+  Right patellar: 2+  Left patellar: 2+  Right achilles: 2+  Left achilles: 2+  Right plantar: normal  Left plantar: normal       Physical Exam  Eyes:      Extraocular Movements: EOM normal.      Pupils: Pupils are equal, round, and  reactive to light.   Neurological:      Mental Status: He is alert and oriented to person, place, and time.      Coordination: Finger-Nose-Finger Test normal.      Gait: Gait is intact.      Deep Tendon Reflexes:      Reflex Scores:       Tricep reflexes are 2+ on the right side and 2+ on the left side.       Bicep reflexes are 2+ on the right side and 2+ on the left side.       Brachioradialis reflexes are 2+ on the right side and 2+ on the left side.       Patellar reflexes are 2+ on the right side and 2+ on the left side.       Achilles reflexes are 2+ on the right side and 2+ on the left side.  Psychiatric:         Speech: Speech normal. Speech is not slurred.         Behavior: Behavior normal.         Thought Content: Thought content normal.         Judgment: Judgment normal.         Labs Reviewed:   No results found for: FERRITIN, IRON, TIBC  No results found for: IRON SATURATION  TSH   Date Value Ref Range Status   09/21/2022 3.64 0.45 - 4.12 uIU/mL Final     Free T4   Date Value Ref Range Status   09/21/2022 0.81 0.61 - 1.76 ng/dL Final     Comment:     Biotin megadosing (consumption >300 mcg/day) may falsely elevate free T4. When indicated, discontinue megadosing for 1 week and repeat testing.     Vit D, 25-Hydroxy (ng/mL)   Date Value   09/21/2022 28.6 (L)     Lab Results   Component Value Date    WBC 5.5 09/21/2022    HGB 13.7 09/21/2022    HCT 39.7 09/21/2022    MCV 84.4 09/21/2022    PLT 239 09/21/2022     Lab Results   Component Value Date    GLUCOSE 91 09/22/2022    BUN 9 09/22/2022    CO2 28 09/22/2022    CREATININE 0.88 09/22/2022    K 3.9 09/22/2022    NA 139 09/22/2022    CL 107 09/22/2022    CALCIUM 8.8 09/22/2022     Lab Results   Component Value Date    HGBA1C 5.3 02/04/2020       Diagnostic Testing:    cEEG start date & time: 09/21/2022 at 1036  cEEG end  date & time: 09/22/2022 at 0700    EEG Interpretation:   This EEG is normal in the awake, drowsy, and sleep state. No events,  electrographic seizures or non-convulsive status epilepticus seen over the entire monitoring period.     CT Head wo contrast (09/21/22, [facility])  1.  No acute intracranial abnormality.   2.  No intracranial mass effect or hemorrhage.    CT head results:  CT Head WO contrast    Result Date: 09/21/2022  IMPRESSION: 1.  No acute intracranial abnormality. 2.  No intracranial mass effect or hemorrhage. Approved by Estanislado Pandy, MD on 09/21/2022 4:50 AM EDT I have personally reviewed the images and I agree with this report. Report Verified by: Letitia Caul, MD at 09/21/2022 5:06 AM EDT   Brain vessel imaging results:  No results found for the past 12 months  Spine imaging results:  No results found for the past 12 months    Medical Decision Making:  The following items were considered in medical decision making:  Obtain records and history from outside facility/provider  Review / order clinical lab tests  Review / order radiology tests  Review / order other diagnostic tests/interventions  Reviewed outside records    I spent 42 minutes speaking with the patient, conducting an interview, performing a limited exam, and educating the patient on my assessment and plan. I also spent 18 minutes, on the same day as the encounter, preparing to see the patient (eg, review of tests), obtaining and/or reviewing separately obtained history, ordering medications, tests, or procedures, referring and communicating with other health care professionals , documenting clinical information in the electronic or other health record, independently interpreting results and communicating results to the patient/family/caregiver, providing care coordination , and performing non-face-to-face activities.     Electronically signed by:  Olene Craven, MD  10/16/2022 3:17 PM

## 2022-10-16 NOTE — Patient Instructions (Signed)
The MIND dietary guideline consists of:   Dark green leafy vegetables (kale, spinach, collard, turnip, or beet greens) and one other color vegetable every day.  Dark greens can be combined with other lettuces in a salad to reduce the bitter taste of the greens. Consider adding greens to soups or stews.  A serving of nuts a day (a serving is about 10 nuts)  Berries at least twice weekly.  Beans every other day   Poultry twice weekly; limit red meat to 2-3 times/week  Fish (not shellfish) at least once a week   Whole grains 3 times/day (brown rice, whole wheat/rye bread, quinoa, bulghur, farro, lentils, pastas made with lentil, chickpea, or edamame flour).  Olive oil as the main added fat  Limit unhealthy-brain foods, especially butter (less than one tablespoon a day), cheese (once weekly), sweets (less than daily and appropriately portion-sized) and fast or fried food (less than weekly)  Avoid alcohol but if you drink, limit to two drinks/week  Consider a daily salad with dark greens (with or without other lettuces), at least one other colored vegetable, berries, nuts and an olive oil dressing.    Follow the weekly guideline and try to obtain a score of >/=9/15 points per week.      Evidence:   9 or more points/15:  53% lower rate of Alzheimerâs disease   6-9 points/15:  35% lower rate compared with those with the lowest MIND scores. The results remained significant after adjusting for other factors associated with dementia including lifestyle, cardiovascular risk factors (e.g., high blood pressure, stroke, diabetes), depression    Other large cohort studies also supported the benefits of higher MIND diet scores, compared with those with the lowest scores for  better cognitive functioning  larger total brain volume  higher memory scores  lower risk of dementia  slower cognitive decline, even when including participants with Alzheimerâs disease and history of stroke.     Clinical trials  A 2023 randomized controlled  trial followed 604 adults aged 65 and older who at baseline were overweight (BMI greater than 25), ate a suboptimal diet, and did not have cognitive impairment but had a first-degree relative with dementia.   The experimental group was schooled on the MIND diet and the control group continued their usual diet.  Dieticians met with both groups and guided them to decrease total daily calorie intake by 250 kcal/day.     Both groups showed improved cognitive performance  Both groups lost on average 11 lb   MRI parameters (volumetrics, white matter changes) did not differ    What to make of this?  Dietary coaching may have been a significant intervention even for the control group (mindful eating techniques, calorie tracking, goal setting)   Three years is probably too short of a period to truly evaluate if food quality made a difference (from following the MIND dietary pattern).  Other studies used a 10 year period.

## 2022-10-17 NOTE — Telephone Encounter (Signed)
[facility] Note    Initial Pharmacist Assessment     David Jordan is a 37 y.o. old patient who  has a past medical history of Bilateral arm weakness, Headache, and Memory loss..    Prescription received for: Qulipta    Chart reviewed for diagnosis, medication appropriateness (including dose and duration), and potential interactions.    Reviewed the role of specialty pharmacy.  Welcome packet will be provided with first order which includes bill of rights, privacy notice, pharmacy contact information (including 24- hour on call pharmacist), hours of operations, complaint process, refill procedure, information about patient assistance programs and patient management program (including opt-out option).     Discussed indication, dose, duration of therapy, storage, administration, potential side effects, safe disposal, pregnancy avoidance (if applicable), and teaching/discussion/referral for training for injectable medications.  Patient verbalized understanding.     Medication & Allergy Reconciliation:  Patient's prescription and non-prescription medications and natural supplements were reviewed and updated.     Medication changes: Start Qulipta    Medication list:   Current Outpatient Medications   Medication Sig Dispense Refill    atogepant (QULIPTA) 60 mg Tab Take 1 tablet (60 mg total) by mouth daily. 30 tablet 5    blood sugar diagnostic (GLUCOSE BLOOD) Strp Use to test blood sugar 1 times a day. Dx: hypoglycemia . Brand per pharmacy / insurance preference. 100 strip 3    blood-glucose meter (GLUCOSE MONITORING KIT) kit Use to test blood sugar 1 times a day. Dx: hypoglycemia . Brand per pharmacy / insurance preference. 1 kit 0    ibuprofen (MOTRIN) 200 MG tablet Take 1 tablet (200 mg total) by mouth every 6 hours as needed for Pain.      lancets Misc Use to test blood sugar 1 times a day. Dx: hypoglycemia . Brand per pharmacy / insurance preference. 100 each 3    lancets Misc Use to test blood  sugar 1 times a day. Dx: hypoglycemia . Brand per pharmacy / insurance preference. 120 each 11    methylPREDNISolone (MEDROL, PAK,) 4 mg tablet follow package directions 21 each 0     No current facility-administered medications for this visit.       Allergies:   Allergies   Allergen Reactions    Codeine Hives and Nausea And Vomiting     Hives and Fever    Shellfish Derived Swelling and Rash       Assessment of Patient's Therapy and Barriers:  - Baseline Migraine Characteristics: Frequency:  30/month; Severity: 5/10 pain scale; Duration:  continuous; Abortive therapy use IBU, APAP  - Missed days from work/school/planned activities: yes  - Hospitalization, urgent care visit, or unplanned doctor visit in last 30 days: yes    Adherence and Counseling:   - Patient's confidence in ability to administer medication and follow treatment plan as prescribed (0 = least confident, 10 = most confident): 10    Adverse Effects and Patient Issues:   - Family planning: N/A     - Birth control method: N/A     - Acute infection status: unknown    Health Maintenance:       There is no immunization history on file for this patient.      - PHQ-2/9:       Depression Screening - During the past month,   Have you often been bothered by feeling down, depressed or hopeless?: (not recorded)  Have you often been bothered by little interest or pleasure in doing  things?: (not recorded)  Which provider was notified?: (not recorded)    PHQ-9 Total Score: (not recorded)     - How would you rate you pain on average (0 = no pain, 10 = worst pain imaginable):     Plan:     - Prescription will be filled by [facility].    Method of delivery:  shipped via UPS by end of day 7/16. Patient should receive 7/17-7/18.    - Recommended monitoring: Number of headache days    - Specialty pharmacy staff will contact patient yearly for assessment of adherence, side effects, labs, and patient consent to bill/ship.      Specialty Pharmacy Care  Management Plan     1. Needs assessment: initiation of therapy     2. Strategies to address needs: n/a    3. Goals and time frame: Decrease number of headache days, decrease severity, decrease duration of migraines, and/or use of rescue medications.    4. Resources available to implement care management plan: copay assistance - ev[employer]cher    5. Patient's motivation: high    Treatment based on AHS guidelines.

## 2022-10-17 NOTE — Telephone Encounter (Signed)
 Routing to MD.

## 2022-10-19 ENCOUNTER — Ambulatory Visit: Admit: 2022-10-19 | Discharge: 2022-10-19 | Payer: PRIVATE HEALTH INSURANCE

## 2022-10-19 DIAGNOSIS — R7989 Other specified abnormal findings of blood chemistry: Secondary | ICD-10-CM

## 2022-10-19 NOTE — Progress Notes (Signed)
[facility] [place]  [facility]  [address]  [name] [address]    Name:  David Jordan Date of Birth: [birth year] (37 y.o.)   MRN: [id number]    Date of Service:  10/19/2022   3:45 PM      Subjective   Chief Complaint:     Chief Complaint   Patient presents with    Follow-up     The patient is following up on his headache and new symptoms include tingling in hands and feet, pain in the abdominal area and fatigue.       History of Present Illness:   David Jordan is a(n) 37 y.o. male here today for the following:     HPI      CT SCAN FROM HOSPITAL VISIT DID NOT SHOW ANY ENCEPHALITIS.     TUESDAY EVENING HE STARTED GETTING SOME NUMBNESS IN HIS FEET AND FINGERS. SENSATION OF TINGLING LIKE THEY WENT TO SLEEP. WILL GET ALTERNATING TINGLING THE LACK OF SENSATION, THEN BACK TO TINGLING. LEFT IS UP THE LEFT LEG.     ALSO NOTES SQUEEZING PAIN IN HIS ABDOMEN BILATERALLY.   WIFE NOTES THAT HE HAS NOT SLEPT WELL AT ALL. LAST NIGHT MAYBE GOT 2-3 HOURS DUE TO DISCOMFORT.     HAS NOT YET STARTED THE QULIPTA - WAS TOLD IT WAS SHIPPED, BUT HAS NOT YET RECEIVED IT.       STARTED HIS STEROIDS YESTERDAY. NO CHANGE IN HEADACHE - STILL TAKING IBUPROFEN, ALMOST CONSTANTLY.       WEARING SUNGLASSES ANY TIME HE IS OUTSIDE BECAUSE GETTING WORSENING ANYTIME HE IS IN SUNLIGHT.       NO FAMILY HISTORY OF MIGRAINES THAT HE IS AWARE OF - HIS MOM MIGHT HAVE VERY MILD MIGRAINES, BUT DIDN'T START UNTIL MENOPAUSE.     NURTEC GIVEN 4:15 PM.     QUAIL THE PAST COUPLE MONTHS. GOATS. RABBITS. DOGS.     HEADACHE WORSENED BY THE NUMBNESS EPISODES, BUT CONSTANT. WILL GET NAUSEA, JUST A FLUSH OF NAUSEA PRECIPITATING THE EPISODES OF PARALYSIS/NUMBNESS.     PHOTOPHOBIA IS ONLY CHANGE IN VISION.       436 STILL NO IMPROVEMENT.      Current Outpatient Medications:  Current Outpatient Medications   Medication Sig Dispense Refill    atogepant (QULIPTA) 60 mg Tab Take 1 tablet (60 mg total) by mouth  daily. 30 tablet 5    blood sugar diagnostic (GLUCOSE BLOOD) Strp Use to test blood sugar 1 times a day. Dx: hypoglycemia . Brand per pharmacy / insurance preference. 100 strip 3    blood-glucose meter (GLUCOSE MONITORING KIT) kit Use to test blood sugar 1 times a day. Dx: hypoglycemia . Brand per pharmacy / insurance preference. 1 kit 0    ibuprofen (MOTRIN) 200 MG tablet Take 1 tablet (200 mg total) by mouth every 6 hours as needed for Pain.      lancets Misc Use to test blood sugar 1 times a day. Dx: hypoglycemia . Brand per pharmacy / insurance preference. 100 each 3    lancets Misc Use to test blood sugar 1 times a day. Dx: hypoglycemia . Brand per pharmacy / insurance preference. 120 each 11    methylPREDNISolone (MEDROL, PAK,) 4 mg tablet follow package directions 21 each 0     No current facility-administered medications for this visit.         ROS:   Review of Systems  See HPI.  Objective:     Vitals:    10/19/22 1540   BP: 129/78   Pulse: 92   Temp: 99.2 F (37.3 C)   SpO2: 99%   Weight: 140 lb (63.5 kg)       Body mass index is 20.67 kg/m.      Physical Exam    Physical Exam   Constitutional: He is oriented to person, place, and time. He appears well-developed and well-nourished. No distress.   HENT:   Head: Normocephalic and atraumatic.   Right Ear: External ear normal.   Left Ear: External ear normal.   Nose: Nose normal.    Eyes: Conjunctivae normal and EOM are normal. Pupils are equal, round.  Neck: Normal range of motion. Neck supple.   Musculoskeletal: Normal range of motion.   Neurological: He is alert and oriented to person, place, and time. He exhibits normal muscle tone. Coordination normal.   Skin: Skin is warm and dry. No rash noted. He is not diaphoretic. No erythema. No pallor.   Psychiatric: He has a normal mood and affect. His behavior is normal. Judgment and thought content normal.            Assessment/Plan:   There are no diagnoses linked to this encounter.    No follow-ups on  file.     1.) NEW ONSET CONSTANT HEADACHE WITH WORSENING/EVOLVING FEATURES CONCERNING FOR INTRACRANIAL PATHOLOGY - STAT MRI WITH AND WITHOUT. HEAVY METALS. REPEAT B12. LABS ASAP.       No LOS data to display        3:45 PM       Venita Sheffield, PA

## 2022-10-19 NOTE — Telephone Encounter (Signed)
Error

## 2022-10-20 ENCOUNTER — Other Ambulatory Visit: Admit: 2022-10-20 | Payer: PRIVATE HEALTH INSURANCE

## 2022-10-20 DIAGNOSIS — R7989 Other specified abnormal findings of blood chemistry: Secondary | ICD-10-CM

## 2022-10-20 LAB — IRON STUDIES
% Iron Saturation: 43 % (ref 15.0–55.0)
Iron: 144 ug/dL (ref 50–212)
TIBC: 335 ug/dL (ref 261–462)

## 2022-10-20 LAB — HEAVY METALS PANEL, BLOOD
Arsenic: 1 ug/L (ref 0–9)
Lead: <1 ug/dL (ref 0.0–3.4)
Mercury: <1 ug/L (ref 0.0–14.9)

## 2022-10-20 LAB — VITAMIN B12: Vitamin B-12: 207 pg/mL (ref 180–914)

## 2022-10-20 NOTE — Telephone Encounter (Addendum)
Monique called from central scheduling - imaging. The patient needs to be sedated for the MRI. So they need a new order for the MRI w/general anesthesia in order to get him scheduled. She will look out for the order since it's STAT.

## 2022-10-20 NOTE — Telephone Encounter (Addendum)
Belenda Cruise stated that it is listed on the order in the reason for exam section. I called central scheduling to find out if that would be sufficient. It is, and they will call him to get him scheduled.

## 2022-10-20 NOTE — Telephone Encounter (Signed)
David Jordan called back and said I was told incorrectly and a new order was still needed. Order pended stating to perform w/general anesthesia in the scheduling instructions as directed by [facility]. Routed to [place] to sign.

## 2022-10-21 ENCOUNTER — Emergency Department: Admit: 2022-10-21 | Payer: PRIVATE HEALTH INSURANCE

## 2022-10-21 ENCOUNTER — Inpatient Hospital Stay
Admission: EM | Admit: 2022-10-21 | Discharge: 2022-10-25 | Disposition: A | Discharge status: Home / Self Care | Payer: PRIVATE HEALTH INSURANCE

## 2022-10-21 DIAGNOSIS — R55 Syncope and collapse: Secondary | ICD-10-CM

## 2022-10-21 DIAGNOSIS — G43109 Migraine with aura, not intractable, without status migrainosus: Secondary | ICD-10-CM

## 2022-10-21 LAB — HEPATIC FUNCTION PANEL
ALT: 21 U/L (ref 7–52)
AST: 15 U/L (ref 13–39)
Albumin: 4.7 g/dL (ref 3.5–5.7)
Alkaline Phosphatase: 59 U/L (ref 36–125)
Bilirubin, Direct: 0.2 mg/dL (ref 0.0–0.4)
Bilirubin, Indirect: 0.8 mg/dL (ref 0.0–1.1)
Total Bilirubin: 1 mg/dL (ref 0.0–1.5)
Total Protein: 7.4 g/dL (ref 6.4–8.9)

## 2022-10-21 LAB — DIFFERENTIAL
Basophils Absolute: 34 /uL (ref 0–200)
Basophils Relative: 0.6 % (ref 0.0–1.0)
Eosinophils Absolute: 29 /uL (ref 15–500)
Eosinophils Relative: 0.5 % (ref 0.0–8.0)
Lymphocytes Absolute: 1721 /uL (ref 850–3900)
Lymphocytes Relative: 30.2 % (ref 15.0–45.0)
Monocytes Absolute: 416 /uL (ref 200–950)
Monocytes Relative: 7.3 % (ref 0.0–12.0)
Neutrophils Absolute: 3500 /uL (ref 1500–7800)
Neutrophils Relative: 61.4 % (ref 40.0–80.0)
nRBC: 0 /100 WBC (ref 0–0)

## 2022-10-21 LAB — VENOUS BLOOD GAS, LINE/SYRINGE
%HBO2-Line Draw: 57.9 % (ref 40.0–70.0)
Base Excess-Line Draw: 7.1 mmol/L — ABNORMAL HIGH (ref <=3.0)
CO2 Content-Line Draw: 33 mmol/L — ABNORMAL HIGH (ref 25–29)
Carboxyhgb-Line Draw: 1.4 %
HCO3-Line Draw: 29 mmol/L — ABNORMAL HIGH (ref 24–28)
Methemoglobin-Line Draw: 0.8 % (ref 0.0–1.5)
PCO2-Line Draw: 45 mm Hg (ref 41–51)
PH-Line Draw: 7.46 — ABNORMAL HIGH (ref 7.32–7.42)
PO2-Line Draw: 34 mm Hg (ref 25–40)
Reduced Hemoglobin-Line Draw: 39.9 % — ABNORMAL HIGH (ref 0.0–5.0)

## 2022-10-21 LAB — CBC
Hematocrit: 39.9 % (ref 38.5–50.0)
Hemoglobin: 13.8 g/dL (ref 13.2–17.1)
MCH: 29.6 pg (ref 27.0–33.0)
MCHC: 34.6 g/dL (ref 32.0–36.0)
MCV: 85.5 fL (ref 80.0–100.0)
MPV: 7.1 fL — ABNORMAL LOW (ref 7.5–11.5)
Platelets: 261 10*3/uL (ref 140–400)
RBC: 4.67 10*6/uL (ref 4.20–5.80)
RDW: 13.4 % (ref 11.0–15.0)
WBC: 5.7 10*3/uL (ref 3.8–10.8)

## 2022-10-21 LAB — SED RATE: Sed Rate: 1 mm/hr (ref 0–15)

## 2022-10-21 LAB — PHOSPHORUS: Phosphorus: 3.7 mg/dL (ref 2.1–4.7)

## 2022-10-21 LAB — URINALYSIS W/RFL TO MICROSCOPIC
Bilirubin, UA: NEGATIVE
Blood, UA: NEGATIVE
Glucose, UA: NEGATIVE mg/dL
Ketones, UA: 20 mg/dL — AB
Leukocyte Esterase, UA: NEGATIVE
Nitrite, UA: NEGATIVE
Protein, UA: NEGATIVE mg/dL
Specific Gravity, UA: 1.028 (ref 1.005–1.035)
Urobilinogen, UA: <2 mg/dL (ref 0.2–1.9)
pH, UA: 6 (ref 5.0–8.0)

## 2022-10-21 LAB — LIPASE: Lipase: 7 U/L (ref 4–82)

## 2022-10-21 LAB — BASIC METABOLIC PANEL
Anion Gap: 10 mmol/L (ref 3–16)
BUN: 16 mg/dL (ref 7–25)
CO2: 28 mmol/L (ref 21–33)
Calcium: 9.6 mg/dL (ref 8.6–10.3)
Chloride: 101 mmol/L (ref 98–110)
Creatinine: 0.95 mg/dL (ref 0.60–1.30)
EGFR: >90
Glucose: 104 mg/dL — ABNORMAL HIGH (ref 70–100)
Osmolality, Calculated: 289 mOsm/kg (ref 278–305)
Potassium: 3.7 mmol/L (ref 3.5–5.3)
Sodium: 139 mmol/L (ref 133–146)

## 2022-10-21 LAB — LACTIC ACID: Lactate: 0.8 mmol/L (ref 0.5–2.2)

## 2022-10-21 LAB — TSH: TSH: 1.33 u[IU]/mL (ref 0.45–4.12)

## 2022-10-21 LAB — T4, FREE: Free T4: 0.95 ng/dL (ref 0.61–1.76)

## 2022-10-21 LAB — MAGNESIUM: Magnesium: 2.1 mg/dL (ref 1.5–2.5)

## 2022-10-21 LAB — C-REACTIVE PROTEIN: CRP: <1 mg/L — ABNORMAL LOW (ref 1.0–10.0)

## 2022-10-21 LAB — HIGH SENSITIVITY TROPONIN: High Sensitivity Troponin: <3 ng/L (ref 0–20)

## 2022-10-21 MED ORDER — albuterol (PROVENTIL) nebulizer solution 2.5 mg
2.5 | RESPIRATORY_TRACT | PRN
Start: 2022-10-21 — End: 2022-10-25

## 2022-10-21 MED ORDER — glucose chewable tablet 12 g
4 | ORAL | PRN
Start: 2022-10-21 — End: 2022-10-25

## 2022-10-21 MED ORDER — dextrose 10%-water (D10W) IV soln
INTRAVENOUS | PRN
Start: 2022-10-21 — End: 2022-10-25

## 2022-10-21 MED ORDER — heparin (porcine) injection 5,000 Units
5000 | Freq: Three times a day (TID) | INTRAMUSCULAR
Start: 2022-10-21 — End: 2022-10-25

## 2022-10-21 MED ORDER — sodium chloride 0.9 % IV infusion
INTRAVENOUS
Start: 2022-10-21 — End: 2022-10-25
  Administered 2022-10-22 – 2022-10-25 (×5): via INTRAVENOUS

## 2022-10-21 MED ORDER — insulin lispro (humaLOG/ADMELOG) injection 0-5 Units
100 | Freq: Three times a day (TID) | SUBCUTANEOUS
Start: 2022-10-21 — End: 2022-10-25

## 2022-10-21 MED ORDER — ondansetron (ZOFRAN) injection 4 mg
4 | INTRAMUSCULAR | PRN
Start: 2022-10-21 — End: 2022-10-25
  Administered 2022-10-23: 15:00:00 via INTRAVENOUS

## 2022-10-21 MED ORDER — dextrose 5 % in lactated ringers (D5LR) infusion 1000 mL
Freq: Once | INTRAVENOUS | Status: AC
Start: 2022-10-21 — End: 2022-10-21
  Administered 2022-10-21: via INTRAVENOUS

## 2022-10-21 MED ORDER — acetaminophen (TYLENOL) tablet 650 mg
325 | ORAL | PRN
Start: 2022-10-21 — End: 2022-10-25
  Administered 2022-10-22: 16:00:00 via ORAL

## 2022-10-21 MED ORDER — OMNIPAQUE (iohexol) 350 mg iodine/mL 125 mL
350 | Freq: Once | INTRAVENOUS | Status: AC | PRN
Start: 2022-10-21 — End: 2022-10-21
  Administered 2022-10-21: 22:00:00 via INTRAVENOUS

## 2022-10-21 MED ORDER — acetaminophen (TYLENOL) tablet 975 mg
325 | Freq: Once | ORAL | Status: AC
Start: 2022-10-21 — End: 2022-10-21
  Administered 2022-10-21: via ORAL

## 2022-10-21 MED ORDER — sodium chloride 0.9% 1,000 mL IV fluid
0.9 | Freq: Once | INTRAVENOUS | Status: AC
Start: 2022-10-21 — End: 2022-10-21
  Administered 2022-10-21: 21:00:00 via INTRAVENOUS

## 2022-10-21 MED ORDER — morphine injection 4 mg
4 | Freq: Once | INTRAVENOUS | Status: AC
Start: 2022-10-21 — End: 2022-10-21
  Administered 2022-10-21: 21:00:00 via INTRAVENOUS

## 2022-10-21 MED ORDER — ondansetron (ZOFRAN) injection 4 mg
4 | Freq: Once | INTRAMUSCULAR | Status: AC
Start: 2022-10-21 — End: 2022-10-21
  Administered 2022-10-21: 21:00:00 via INTRAVENOUS

## 2022-10-21 MED FILL — TYLENOL 325 MG TABLET: 325 325 mg | ORAL | Qty: 3

## 2022-10-21 MED FILL — DEXTROSE 5 % AND LACTATED RINGERS INTRAVENOUS SOLUTION: 1000.0000 1000.0000 mL | INTRAVENOUS | Qty: 1000

## 2022-10-21 MED FILL — ONDANSETRON HCL (PF) 4 MG/2 ML INJECTION SOLUTION: 4 4 mg/2 mL | INTRAMUSCULAR | Qty: 2

## 2022-10-21 MED FILL — MORPHINE 4 MG/ML INTRAVENOUS SOLUTION: 4 4 mg/mL | INTRAVENOUS | Qty: 1

## 2022-10-21 MED FILL — SODIUM CHLORIDE 0.9 % INTRAVENOUS SOLUTION: INTRAVENOUS | Qty: 1000

## 2022-10-21 NOTE — ED Triage Notes (Signed)
Pt. C/o bilateral arm and leg numbness as well as pins and needles that has worsened over the past 2 days. Pt. Is able to move all extremities without difficulty. Pt. Is alert and oriented x4. Pt. Is pink warm dry.

## 2022-10-21 NOTE — ED Provider Notes (Signed)
[facility] ED Note        Patient History     HPI: David Jordan is a 37 y.o. male who presents with abdominal pain, paresthesias, as well as headache and occasional memory loss and migraines.  Patient was completely healthy until approximately 1 month ago today.  He was admitted to this hospital for occasional mental status changes, with episodic forgetfulness.  This was also in the setting of a headache.  He had a head CT was negative.  He was admitted and had a cEEG which was negative.  He was unable to tolerate an MRI and thus did not have it despite trying Ativan.  He is continue to have headaches and is currently on a prednisone taper for that.  However, for the last 3 days, he has now developed diffuse abdominal pain without vomiting or diarrhea although he does feel very nauseated.  He has not eaten anything for few days.  Denies any back pain but states he had some paresthesias in his hands and feet earlier and those have since extended to his upper arm and legs.  Denies any change in bowel or bladder habits.  He has had some low-grade temperatures in the last few days as well.       Past Medical History:   Diagnosis Date    Allergy     Seasonal    Bilateral arm weakness     Headache     Memory loss     Migraines June 2024       Past Surgical History:   Procedure Laterality Date    PR EEG PHYS/QHP 2-12 HR WITH VEEG  09/22/2022    WISDOM TOOTH EXTRACTION         Social History     Tobacco Use   Smoking Status Never   Smokeless Tobacco Never     Social History     Substance and Sexual Activity   Alcohol Use Not Currently     Social History     Substance and Sexual Activity   Drug Use Never       Current Discharge Medication List        CONTINUE these medications which have NOT CHANGED    Details   ibuprofen (MOTRIN) 200 MG tablet Take 1 tablet (200 mg total) by mouth every 6 hours as needed for Pain.      methylPREDNISolone (MEDROL, PAK,) 4 mg tablet  follow package directions  Qty: 21 each, Refills: 0    Associated Diagnoses: Intractable chronic migraine with aura and without status migrainosus      atogepant (QULIPTA) 60 mg Tab Take 1 tablet (60 mg total) by mouth daily.  Qty: 30 tablet, Refills: 5    Associated Diagnoses: Intractable chronic migraine with aura and without status migrainosus      blood sugar diagnostic (GLUCOSE BLOOD) Strp Use to test blood sugar 1 times a day. Dx: hypoglycemia . Brand per pharmacy / insurance preference.  Qty: 100 strip, Refills: 3      blood-glucose meter (GLUCOSE MONITORING KIT) kit Use to test blood sugar 1 times a day. Dx: hypoglycemia . Brand per pharmacy / insurance preference.  Qty: 1 kit, Refills: 0      !! lancets Misc Use to test blood sugar 1 times a day. Dx: hypoglycemia . Brand per pharmacy / insurance preference.  Qty: 100 each, Refills: 3      !! lancets Misc Use to test blood sugar 1 times a day. Dx:  hypoglycemia . Brand per pharmacy / insurance preference.  Qty: 120 each, Refills: 11       !! - Potential duplicate medications found. Please discuss with provider.          Allergies:   Allergies as of 10/21/2022 - Fully Reviewed 10/21/2022   Allergen Reaction Noted    Codeine Hives and Nausea And Vomiting 01/20/2020    Shellfish derived Swelling and Rash 09/21/2022       Review of Systems     ROS: Positive for headache, episodic loss of consciousness.  All other ROS were negative.    Physical Exam     ED Triage Vitals [10/21/22 1602]   Vital Signs Group      Temp 100 F (37.8 C)      Temp Source Oral      Heart Rate 89      Heart Rate Source Monitor      Resp 16      SpO2 100 %      BP 148/81      MAP (mmHg) 101      BP Method Automatic      BP Location Right upper arm      BP Cuff Size       Patient Position Sitting   SpO2 100 %   O2 Device None (Room air)     Vitals:    10/21/22 1700 10/21/22 1802 10/21/22 1836 10/21/22 1900   BP: 121/83 127/84 121/82 142/86   BP Location:       Patient Position:       Pulse:  87 82 81 72   Resp:       Temp:       TempSrc:       SpO2: 100% 96% 98% 96%   Weight:       Height:          Temp Readings from Last 2 Encounters:   10/21/22 100 F (37.8 C) (Oral)   10/19/22 99.2 F (37.3 C)     BP Readings from Last 2 Encounters:   10/21/22 142/86   10/19/22 129/78     Pulse Readings from Last 2 Encounters:   10/21/22 72   10/19/22 92        Constitutional:  Well developed, well nourished, no acute distress, non-toxic appearance   Eyes:  Sclera anicteric, conjunctiva normal.  HEENT:  Atraumatic, external ears normal, oropharynx moist.   Neck: normal range of motion, supple.  Respiratory:  No respiratory distress, normal breath sounds, Good air movement   Cardiovascular:  Regular rate, 2+ pulse.  GI:  Soft, nondistended, no rebound, no guarding.   Musculoskeletal:  No edema, no deformities.   Skin:  No rash or nodules noted.   Neurologic:  Awake and alert.  Moves all four extremities.  Light touch intact.    Psychiatric:  Normal mood.  Behavior appropriate.      Diagnostic Studies     Labs:      Labs Reviewed   CBC - Abnormal; Notable for the following components:       Result Value    MPV 7.1 (*)     All other components within normal limits   BASIC METABOLIC PANEL - Abnormal; Notable for the following components:    Glucose 104 (*)     All other components within normal limits   URINALYSIS W/RFL TO MICROSCOPIC - Abnormal; Notable for the following components:    Ketones, UA 20 (*)  All other components within normal limits    Narrative:     Microscopic testing is not performed when the dipstick is negative for blood, leukocyte, protein and nitrite.   VENOUS BLOOD GAS, LINE/SYRINGE - Abnormal; Notable for the following components:    PH-Line Draw 7.46 (*)     HCO3-Line Draw 29 (*)     CO2 Content-Line Draw 33 (*)     Base Excess-Line Draw 7.1 (*)     Reduced Hemoglobin-Line Draw 39.9 (*)     All other components within normal limits   C-REACTIVE PROTEIN - Abnormal; Notable for the following  components:    CRP <1.0 (*)     All other components within normal limits   URINE DRUG SCREEN WITHOUT CONFIRMATION, STAT - Abnormal; Notable for the following components:    Opiates UR, 300 ng/mL Cutoff Presumptive Positive (*)     All other components within normal limits   LACTATE DEHYDROGENASE - Abnormal; Notable for the following components:    LD 89 (*)     All other components within normal limits   BLOOD CULTURE-PERIPHERAL   BLOOD CULTURE-PERIPHERAL   DIFFERENTIAL   HEPATIC FUNCTION PANEL   LIPASE   MAGNESIUM   PHOSPHORUS   TSH   T4, FREE   LACTIC ACID   SED RATE   HIGH SENSITIVITY TROPONIN   INFLUENZA A AND B, COVID, RSV COMBINATION ASSAY, NAA   ETHANOL, SERUM   ENTERIC PATHOGEN PANEL   ANA (IFA) WITH TITER       Radiology:  CT ABDOMEN AND PELVIS WITH IV CONTRAST  VITAL SIGNS  HYPOGLYCEMIA PROTOCOL  INTAKE AND OUTPUT  CMU REMOTE CARDIAC MONITOR  BATHROOM PRIVILEGES WITH ASSISTANCE  GLUCOSE POC NURSING  ISOLATION CART  ED CONTACT PROVIDER [facility]  IP CONSULT TO NEUROLOGY  IP BED REQUEST  ASSIGN TO OBSERVATION  FULL CODE  FALL PRECAUTIONS  SEIZURE PRECAUTIONS  INSERT PERIPHERAL IV  DIET REGULAR(7)  RT ASSESS AND TREAT PER CRITERIA, [facility] V2  PT EVAL AND TREAT  OT EVAL AND TREAT  PATIENT TO WEAR MASK OUTSIDE OF ROOM  UP IN CHAIR  CT Abdomen and Pelvis With IV contrast   Final Result   IMPRESSION:      No acute abnormality in the abdomen and pelvis.         Approved by Estanislado Pandy, MD on 10/21/2022 7:19 PM EDT      I have personally reviewed the images and I agree with this report.      Report Verified by: Micheal Likens, DO at 10/21/2022 7:26 PM EDT            EKG:        Emergency Department Procedures         ED Course and MDM          Medical Decision Making  37 year old male presenting with episodic loss of consciousness episodes but without syncope.  These episodes seem potentially epileptic in nature but he had a negative continuous EEG his last admission.  Still, he is having worsening of the symptoms.  He  also states he is having continuous headache for the last month although he had negative head CT.  He was due to have an MRI in the future, as his last MRI attempt had to be aborted due to claustrophobia.  He has no meningismus on examination.  He also complains of bilateral paresthesias that are ascending, I did consider Guillain-Barr, but he does have positive DTRs in  his bilateral extremities.  Lab work is all within normal limits.  He does complain of diffuse abdominal pain but his sed rate and CRP are negative and his CT of the abdomen is negative.  He is not febrile but has low-grade temperature but with otherwise normal lab test, I do not feel this needs further workup at this time.  He might need an LP as an inpatient, but nothing emergent.  He will be admitted to the medical service for reevaluation, consideration of MRI as an inpatient and further neurologic workup.  Case discussed with Dr. Allene Dillon who graciously accepts the patient for admission.    Problems Addressed:  Episode of loss of consciousness: complicated acute illness or injury    Amount and/or Complexity of Data Reviewed  External Data Reviewed: notes.  Labs: ordered. Decision-making details documented in ED Course.  Radiology: ordered and independent interpretation performed. Decision-making details documented in ED Course.  ECG/medicine tests: ordered and independent interpretation performed. Decision-making details documented in ED Course.    Risk  OTC drugs.  Prescription drug management.  Decision regarding hospitalization.            Meds given in ED or prescribed for discharge:  Medications   glucose chewable tablet 12 g (has no administration in time range)   dextrose 10%-water (D10W) IV soln (has no administration in time range)     Or   dextrose 10%-water (D10W) IV soln (has no administration in time range)   sodium chloride 0.9 % IV infusion (has no administration in time range)   heparin (porcine) injection 5,000 Units (has no  administration in time range)   albuterol (PROVENTIL) nebulizer solution 2.5 mg (has no administration in time range)   acetaminophen (TYLENOL) tablet 650 mg (has no administration in time range)   ondansetron (ZOFRAN) injection 4 mg (has no administration in time range)   insulin lispro (humaLOG/ADMELOG) injection 0-5 Units (has no administration in time range)   morphine injection 4 mg (4 mg Intravenous Given 10/21/22 1648)   ondansetron (ZOFRAN) injection 4 mg (4 mg Intravenous Given 10/21/22 1648)   sodium chloride 0.9% 1,000 mL IV fluid ( Intravenous Stopped 10/21/22 1833)   OMNIPAQUE (iohexol) 350 mg iodine/mL 125 mL (125 mLs Intravenous Given 10/21/22 1801)   acetaminophen (TYLENOL) tablet 975 mg (975 mg Oral Given 10/21/22 1942)   dextrose 5 % in lactated ringers (D5LR) infusion 1000 mL (0 mLs Intravenous Stopped 10/21/22 2144)       Clinical Impression:  1. Episode of loss of consciousness        Patient Referred to:    No follow-up provider specified.    Discharge Medications:  Current Discharge Medication List          Future Appointments   Date Time Provider Department Center   10/27/2022  1:45 PM [facility] MRI 1 [facility] MRI [facility] Imaging   01/16/2023  3:30 PM Alyson Misty Stanley, MD [facility] NEUR [facility]         Critical Care Time (Attendings)          Gavin Potters, MD  10/21/22 [phone]

## 2022-10-21 NOTE — H&P (Signed)
[facility]  History and Physical    Admit Date:   10/21/2022    Patient's PCP:  Venita Sheffield, PA  DOB:     10-22-1985  Patient Class:  Observation    CHIEF COMPLAINT     Chief Complaint   Patient presents with    Numbness       HISTORY OF PRESENT ILLNESS   David Jordan is a 37 y.o. male w/ past medical history of migraine headaches, and episodes of altered mental status that include memory loss, bilateral extremity numbness and weakness who presents to [facility] secondary to abdominal pain, worsening bilateral lower extremity numbness and weakness, memory loss and intractable headache.  Patient was admitted to [facility] on 09/2022 for same symptoms and was worked up thoroughly by neurology at that time.  At that time there was concern of questionable seizure activity, the patient underwent EEG and CT of the head that demonstrated no acute abnormality.  The patient was scheduled to undergo an MRI unfortunately patient with severe claustrophobia was unable to have his MRI and was scheduled for outpatient.  Patient also had episodes of hypoglycemia.  Patient with no history of diabetes or thyroid disorder.  Workup at that point had been within normal limits and the patient was discharged to follow-up with outpatient.  Patient did follow-up with neurology, the patient was started on atogepant and Medrol Dosepak with some mild improvement although the patient was unable to obtain the atogepant until today and has not been started.  Patient states that today he started having lower extremity numbness that progressed from 2 days ago.  He denies any chest pain or chest discomfort.  Patient was noted to have nausea with the abdominal pain but no emesis.  The patient has had decreased oral intake in the last 3 days.  Reports questionable low-grade fever.  No cough or sputum production.  No ill contacts.  Patient presents to the emerged part for evaluation.  ED evaluation demonstrate the  patient to have a pH of 7.46.  Electrolytes were within normal limits.  Hepatic panel was within normal limits.  Inflammatory markers were elevated.  CBC did not show any leukocytosis.  Urinalysis demonstrated ketones.  Urine tox screen demonstrated positive opioids.  CT of the abdomen did not demonstrate any acute abnormality.  During the ED evaluation, the patient was noted to have a episode where the mother reports the patient stared out, was not responding to any commands, was noted the patient was tachycardic at that time and slightly diaphoretic.  Patient reported remembering the episode.  Patient continues have headache.    PAST MEDICAL HISTORY     Past Medical History:   Diagnosis Date    Allergy     Seasonal    Bilateral arm weakness     Headache     Memory loss     Migraines June 2024       PAST SURGICAL HISTORY     Past Surgical History:   Procedure Laterality Date    PR EEG PHYS/QHP 2-12 HR WITH VEEG  09/22/2022    WISDOM TOOTH EXTRACTION         MEDICATIONS   (Not in a hospital admission)        Current Facility-Administered Medications   Medication Dose Frequency Provider Last Admin    acetaminophen  650 mg Q4H PRN Shari Heritage, DO      albuterol  2.5 mg RT Q4H PRN Shari Heritage, DO  dextrose 10% in water  12.5 g Q15 Min PRN Shari Heritage, DO      Or    dextrose 10% in water  25 g Q15 Min PRN Callender, DO      glucose  12 g Q15 Min PRN Burns, DO      heparin  5,000 Units 3 times per day Barrington, DO      [START ON 10/22/2022] insulin lispro  0-5 Units TID AC Sherrill Mckamie, DO      ondansetron  4 mg Q4H PRN Shari Heritage, DO      sodium chloride 0.9 %  50 mL/hr Continuous Shari Heritage, DO       Current Outpatient Medications   Medication Sig    Qulipta Take 1 tablet (60 mg total) by mouth daily.    glucose blood Use to test blood sugar 1 times a day. Dx: hypoglycemia . Brand per pharmacy / insurance preference.    blood-glucose meter Use to test blood sugar 1 times a day. Dx: hypoglycemia . Brand  per pharmacy / insurance preference.    ibuprofen Take 1 tablet (200 mg total) by mouth every 6 hours as needed for Pain.    lancets Use to test blood sugar 1 times a day. Dx: hypoglycemia . Brand per pharmacy / insurance preference.    lancets Use to test blood sugar 1 times a day. Dx: hypoglycemia . Brand per pharmacy / insurance preference.    methylPREDNISolone follow package directions       ALLERGIES   Codeine and Shellfish derived    SOCIAL HISTORY   Marital status : Married, 1 child  TOBACCO:  reports that he has never smoked. He has never used smokeless tobacco.       ETOH:   reports that he does not currently use alcohol.  DRUG:    Social History     Substance and Sexual Activity   Drug Use Never      Occupation: Financial controller  FAMILY HISTORY     Family History   Problem Relation Age of Onset    Diabetes Father     Heart disease Father     Cancer Neg Hx     Kidney disease Neg Hx     Stroke Neg Hx          REVIEW OF SYSTEMS   General: No fevers, chills, +night sweats, no weight loss, no visual or auditory deficits.  Cardiac: No palpitations, no chest pain, no dyspnea on exertion, no PND orthopnea.  Pulm: No chronic cough, no sputum production.  No hemoptysis.  No bruising.  GI: + nausea, no vomiting, or diarrhea, no melena or hematochezia.  Decreased appetite  Musculoskeletal: No arthralgias or myalgias.   Neuro: No seizure like activity, no unilateral weakness, -CVA/ TIA  Skin: No rashes or ulcerations  Endo: No polydipsia, polyuria or polyphagia, no pancreatic insufficiency, no adrenal disease.  GU: No nocturia, no dysuria or hematuria  Psych: +Depression/anxiety    PHYSICAL EXAM   Blood Pressure 142/86   Pulse 72   Temperature 100 F (37.8 C) (Oral)   Respiration 16   Height 5' 9 (1.753 m)   Weight 135 lb (61.2 kg)   Oxygen Saturation 96%   Body Mass Index 19.94 kg/m       General Appearance:  Alert and oriented 3, in no acute distress, nontoxic, pleasant, follows commands, slightly cachectic    HEENT:  Atraumatic/NC, PERRLA, no icterus or conjunctival hemorrhages, mucous  membranes are moist.  No oral lesions.   Neck:  Supple, no JVD, no bruits, thyromegaly, no lymphadenopathy.   Lungs:  Clear to auscultation bilaterally with no wheezing, rales, or rhonchi.   Heart:  Regular, positive S1, positive S2, no appreciated murmur, or gallop.   Abdomen:  Soft, nontender, nondistended, good bowel sounds in all 4 quadrants, no organomegaly, no pulsatile mass.   Extremities:  No clubbing or cyanosis, no edema bilaterally   Pulses:  2+ pulses@4  extremities   Skin:  No rash or ulcerations  Multiple tattoos of the lower extremities bilaterally   Neurologic:  Cranial nerves 2-12 intact, extraocular muscle intact, no focal lesion appreciated.  Decree sensation of the lower extremities mid thigh to ankles.  Minimal weakness.     LABS     Results for orders placed or performed during the hospital encounter of 10/21/22   CBC   Result Value Ref Range    WBC 5.7 3.8 - 10.8 10E3/uL    RBC 4.67 4.20 - 5.80 10E6/uL    Hemoglobin 13.8 13.2 - 17.1 g/dL    Hematocrit 60.6 30.1 - 50.0 %    MCV 85.5 80.0 - 100.0 fL    MCH 29.6 27.0 - 33.0 pg    MCHC 34.6 32.0 - 36.0 g/dL    RDW 60.1 09.3 - 23.5 %    Platelets 261 140 - 400 10E3/uL    MPV 7.1 (L) 7.5 - 11.5 fL   Differential   Result Value Ref Range    Neutrophils Relative 61.4 40.0 - 80.0 %    Lymphocytes Relative 30.2 15.0 - 45.0 %    Monocytes Relative 7.3 0.0 - 12.0 %    Eosinophils Relative 0.5 0.0 - 8.0 %    Basophils Relative 0.6 0.0 - 1.0 %    nRBC 0 0 - 0 /100 WBC    Neutrophils Absolute 3,500 1,500 - 7,800 /uL    Lymphocytes Absolute 1,721 850 - 3,900 /uL    Monocytes Absolute 416 200 - 950 /uL    Eosinophils Absolute 29 15 - 500 /uL    Basophils Absolute 34 0 - 200 /uL   Basic metabolic panel   Result Value Ref Range    Sodium 139 133 - 146 mmol/L    Potassium 3.7 3.5 - 5.3 mmol/L    Chloride 101 98 - 110 mmol/L    CO2 28 21 - 33 mmol/L    Anion Gap 10 3 - 16 mmol/L    BUN  16 7 - 25 mg/dL    Creatinine 5.73 2.20 - 1.30 mg/dL    Glucose 254 (H) 70 - 100 mg/dL    Calcium 9.6 8.6 - 27.0 mg/dL    Osmolality, Calculated 289 278 - 305 mOsm/kg    EGFR >90    Hepatic Function Panel   Result Value Ref Range    Total Bilirubin 1.0 0.0 - 1.5 mg/dL    Bilirubin, Direct 0.2 0.0 - 0.4 mg/dL    AST 15 13 - 39 U/L    ALT 21 7 - 52 U/L    Alkaline Phosphatase 59 36 - 125 U/L    Total Protein 7.4 6.4 - 8.9 g/dL    Albumin 4.7 3.5 - 5.7 g/dL    Bilirubin, Indirect 0.8 0.0 - 1.1 mg/dL   Lipase   Result Value Ref Range    Lipase 7 4 - 82 U/L   Magnesium   Result Value Ref Range    Magnesium 2.1  1.5 - 2.5 mg/dL   Phosphorus   Result Value Ref Range    Phosphorus 3.7 2.1 - 4.7 mg/dL   TSH (Thyroid Stimulating Hormone)   Result Value Ref Range    TSH 1.33 0.45 - 4.12 uIU/mL   T4, free   Result Value Ref Range    Free T4 0.95 0.61 - 1.76 ng/dL   Urinalysis w/Rfl to Microscopic   Result Value Ref Range    Color, UA Yellow Yellow,Straw    Clarity, UA Clear Clear    Specific Gravity, UA 1.028 1.005 - 1.035    pH, UA 6.0 5.0 - 8.0    Protein, UA Negative Negative mg/dL    Glucose, UA Negative Negative mg/dL    Ketones, UA 20 (A) Negative mg/dL    Bilirubin, UA Negative Negative    Blood, UA Negative Negative    Nitrite, UA Negative Negative    Urobilinogen, UA <2.0 0.2 - 1.9 mg/dL    Leukocyte Esterase, UA Negative Negative   Lactic Acid   Result Value Ref Range    Lactate 0.8 0.5 - 2.2 mmol/L   Venous Blood Gas, Line/Syringe   Result Value Ref Range    PH-Line Draw 7.46 (H) 7.32 - 7.42    PCO2-Line Draw 45 41 - 51 mm Hg    PO2-Line Draw 34 25 - 40 mm Hg    HCO3-Line Draw 29 (H) 24 - 28 mmol/L    CO2 Content-Line Draw 33 (H) 25 - 29 mmol/L    Base Excess-Line Draw 7.1 (H) -2.0 - 3.0 mmol/L    %HBO2-Line Draw 57.9 40.0 - 70.0 %    Carboxyhgb-Line Draw 1.4 %    Methemoglobin-Line Draw 0.8 0.0 - 1.5 %    Reduced Hemoglobin-Line Draw 39.9 (H) 0.0 - 5.0 %   Sed Rate   Result Value Ref Range    Sed Rate 1 0 - 15 mm/hr    C-reactive protein   Result Value Ref Range    CRP <1.0 (L) 1.0 - 10.0 mg/L   High Sensitivity Troponin   Result Value Ref Range    High Sensitivity Troponin <3 0 - 20 ng/L   Influenza A and B, COVID, RSV Combination Assay, NAA   Result Value Ref Range    Influenza A Negative Negative,Not Detected    Influenza B Negative Negative,Not Detected    RSV Negative Negative    SARS-CoV-2 Negative Not Detected,Negative         RADIOLOGY   CT Abdomen and Pelvis With IV contrast    Result Date: 10/21/2022  EXAM: CT ABDOMEN AND PELVIS WITH IV CONTRAST INDICATION: Abdominal pain, acute, nonlocalized TECHNIQUE: CT of the abdomen and pelvis was performed after the administration of intravenous contrast. Axial images were obtained with coronal and sagittal reconstructions. CONTRAST: 125 mL of IOHEXOL 350 MG IODINE/ML INTRAVENOUS SOLUTION administered intravenously FIELD OF VIEW: 36 cm COMPARISON: None available. FINDINGS: Lower chest: Unremarkable. Liver: Normal morphology. Segment 4 lesion measuring 2.3 x 2.1 cm, likely benign cyst. Additional scattered subcentimeter hypoattenuating hepatic lesions are too small to characterize, may represent additional cysts or hemangiomas. Biliary tree: Unremarkable. Spleen: Nonenlarged. Low-density lesions in the spleen measuring up to 1.9 cm likely benign cysts. Pancreas: Suspected pancreas divisum. Otherwise unremarkable. Adrenal glands: Unremarkable. Kidneys/Ureters/Bladder: Few subcentimeter low-density lesions within the left kidney are too small to characterize but likely represent cysts. No hydronephrosis. No radiopaque renal or ureteral calculi. Urinary bladder is underdistended limiting evaluation. Gastrointestinal tract: Unremarkable. Lymphatics: Unremarkable. Vasculature:  Unremarkable. Peritoneum/Retroperitoneum: Unremarkable. Abdominal wall/soft tissues: Unremarkable. Genital Organs: Unremarkable Osseous structures: No acute osseous abnormality.     IMPRESSION: No acute  abnormality in the abdomen and pelvis. Approved by Estanislado Pandy, MD on 10/21/2022 7:19 PM EDT I have personally reviewed the images and I agree with this report. Report Verified by: Micheal Likens, DO at 10/21/2022 7:26 PM EDT       ASSESSMENT & PLAN     Patient will be admitted to the hospital for/with the following problems:    Transient altered mental status: Rule out underlying demyelinating disease, seizure activity, occult infection.  Migraine headaches with aura  Abdominal pain: IBS versus IBD  History of hypoglycemia: Rule out underlying endocrinopathy  Dehydration      Plan:  Admit to telemetry floor.  Neurology to see in consultation.  Blood cultures pending  EEG ordered  Will need to schedule MRI with sedation  IV fluids for fluid replacement  PT OT to eval and treat.  Patient placed on insulin sliding scale with Accu-Cheks.  Stool cultures ordered.  Check LDH, cortisol level and ANA.  Labs in the morning.        I personally reviewed the patient's medical record, labs, radiology reports and discussed the patient's case with the ER provider submitting the patient for admission.  Admission was discussed with the patient in detail; all questions answered and patient agrees with plan.      This note was dictated using voice-recognition software, which occasionally leads to inadvertent typographic errors.    Electronically signed,  Shari Heritage, DO    10/21/2022  9:48 PM  [facility]

## 2022-10-22 LAB — DIFFERENTIAL
Basophils Absolute: 47 /uL (ref 0–200)
Basophils Relative: 0.8 % (ref 0.0–1.0)
Eosinophils Absolute: 65 /uL (ref 15–500)
Eosinophils Relative: 1.1 % (ref 0.0–8.0)
Lymphocytes Absolute: 3062 /uL (ref 850–3900)
Lymphocytes Relative: 51.9 % — ABNORMAL HIGH (ref 15.0–45.0)
Monocytes Absolute: 537 /uL (ref 200–950)
Monocytes Relative: 9.1 % (ref 0.0–12.0)
Neutrophils Absolute: 2189 /uL (ref 1500–7800)
Neutrophils Relative: 37.1 % — ABNORMAL LOW (ref 40.0–80.0)
nRBC: 0 /100 WBC (ref 0–0)

## 2022-10-22 LAB — URINE DRUG SCREEN WITHOUT CONFIRMATION, STAT
Amphetamine, 500 ng/mL Cutoff: NEGATIVE
Barbiturates UR, 300  ng/mL Cutoff: NEGATIVE
Benzodiazepines UR, 300 ng/mL Cutoff: NEGATIVE
Buprenorphine, 5 ng/mL Cutoff: NEGATIVE
Cocaine UR, 300 ng/mL Cutoff: NEGATIVE
Fentanyl, 2 ng/mL Cutoff: NEGATIVE
Methadone, UR, 300 ng/mL Cutoff: NEGATIVE
Opiates UR, 300 ng/mL Cutoff: POSITIVE — AB
Oxycodone, 100 ng/mL Cutoff: NEGATIVE
THC UR, 50 ng/mL Cutoff: NEGATIVE
Tricyclic Antidepressants, 300 ng/mL Cutoff: NEGATIVE

## 2022-10-22 LAB — MOVEMENT DISORDER, AUTOIMM/PARANEO EVALUATION, SERUM
AGNA 1: NEGATIVE
AMPA-R Ab CBA, Serum: NEGATIVE
ANNA-1: NEGATIVE
ANNA-2: NEGATIVE
ANNA-3: NEGATIVE
AP3B2 IFA, Serum: NEGATIVE
Amphiphysin Ab: NEGATIVE
CASPR2-IgG CBA, S: NEGATIVE
CRMP-5 IgG Western Blot: NEGATIVE
DPPX Receptor Ab, CBA IFA: NEGATIVE
GABA-B-R Ab CBA, Serum: NEGATIVE
GAD65 Antibody: 0 nmol/L (ref <=0.02)
GFAP IFA, S: NEGATIVE
GRAF 1 IFA: NEGATIVE
ITPR1 IFA: NEGATIVE
IgLON5 CBA, S: NEGATIVE
KLHL11 Ab CBA: NEGATIVE
LGI1-IgG CBA, S: NEGATIVE
NIF IFA, S: NEGATIVE
NMDA-R Ab CBA, Serum: NEGATIVE
Neurochondrin IFA: NEGATIVE
P/Q-Type Calcium Channel Ab: 0 nmol/L (ref <=0.02)
PCA-1, Serum: NEGATIVE
PCA-2, Serum: NEGATIVE
PCA-Tr, Serum: NEGATIVE
Septin-5 IFA, S: NEGATIVE
Septin-7 IF: NEGATIVE
mGluR1 Ab IFA, S: NEGATIVE

## 2022-10-22 LAB — ANCA PANEL
Antimyeloperoxidase (MPO) Abs: <0.2 units (ref 0.0–0.9)
Antiproteinase 3 (PR-3) Abs: <0.2 units (ref 0.0–0.9)
Atypical Panca Titer: <1:20 {titer}
C-ANCA: <1:20 {titer}
Perinuclear (P-ANCA) Titer: <1:20 {titer}

## 2022-10-22 LAB — ANA (IFA) WITH TITER
ANA Titer by IFA: NEGATIVE
ANA Titer by IFA: NEGATIVE

## 2022-10-22 LAB — INFLUENZA A AND B, COVID, RSV COMBINATION ASSAY, NAA
Influenza A: NEGATIVE
Influenza B: NEGATIVE
RSV: NEGATIVE
SARS-CoV-2: NEGATIVE

## 2022-10-22 LAB — CBC
Hematocrit: 37.8 % — ABNORMAL LOW (ref 38.5–50.0)
Hemoglobin: 12.7 g/dL — ABNORMAL LOW (ref 13.2–17.1)
MCH: 28.9 pg (ref 27.0–33.0)
MCHC: 33.5 g/dL (ref 32.0–36.0)
MCV: 86.3 fL (ref 80.0–100.0)
MPV: 7 fL — ABNORMAL LOW (ref 7.5–11.5)
Platelets: 232 10*3/uL (ref 140–400)
RBC: 4.38 10*6/uL (ref 4.20–5.80)
RDW: 13.4 % (ref 11.0–15.0)
WBC: 5.9 10*3/uL (ref 3.8–10.8)

## 2022-10-22 LAB — HEPATITIS B SURFACE ANTIGEN: Hep B Surface Ag: NONREACTIVE

## 2022-10-22 LAB — BASIC METABOLIC PANEL
Anion Gap: 3 mmol/L (ref 3–16)
BUN: 11 mg/dL (ref 7–25)
CO2: 32 mmol/L (ref 21–33)
Calcium: 8.7 mg/dL (ref 8.6–10.3)
Chloride: 106 mmol/L (ref 98–110)
Creatinine: 0.83 mg/dL (ref 0.60–1.30)
EGFR: >90
Glucose: 82 mg/dL (ref 70–100)
Osmolality, Calculated: 290 mOsm/kg (ref 278–305)
Potassium: 3.7 mmol/L (ref 3.5–5.3)
Sodium: 141 mmol/L (ref 133–146)

## 2022-10-22 LAB — LACTIC ACID: Lactate: 0.7 mmol/L (ref 0.5–2.2)

## 2022-10-22 LAB — LACTATE DEHYDROGENASE: LD: 89 U/L — ABNORMAL LOW (ref 110–270)

## 2022-10-22 LAB — ENTERIC PATHOGEN PANEL
Campylobacter Group (C. ecoli, C. jejuni, C. lari): NOT DETECTED
Norovirus: NOT DETECTED
Rotavirus: NOT DETECTED
Salmonella species: NOT DETECTED
Shiga toxin 1: NOT DETECTED
Shiga toxin 2: NOT DETECTED
Shigella species: NOT DETECTED
Vibrio Group (Vibrio cholerae, Vibrio parahaemolyticus): NOT DETECTED
Yersinia enterocolitica: NOT DETECTED

## 2022-10-22 LAB — POC GLU MONITORING DEVICE
POC Glucose Monitoring Device: 88 mg/dL (ref 70–100)
POC Glucose Monitoring Device: 87 mg/dL (ref 70–100)
POC Glucose Monitoring Device: 97 mg/dL (ref 70–100)
POC Glucose Monitoring Device: 85 mg/dL (ref 70–100)

## 2022-10-22 LAB — VARICELLA ZOSTER ANTIBODY, IGG
VZV NUM: 3556 INDEX — ABNORMAL HIGH (ref 0.00–134.99)
Varicella IgG: POSITIVE — AB

## 2022-10-22 LAB — RHEUMATOID FACTOR: Rheumatoid Factor: <10 IU/mL (ref 0.0–14.0)

## 2022-10-22 LAB — HEPATITIS B CORE IGM: Hep B Core IgM: NONREACTIVE

## 2022-10-22 LAB — ETHANOL, SERUM: Ethanol: <10 mg/dL (ref 0–10)

## 2022-10-22 LAB — BLOOD CULTURE-PERIPHERAL
Culture Result: NO GROWTH
Culture Result: NO GROWTH

## 2022-10-22 LAB — TREPONEMA PALLIDUM AB WITH REFLEX: Treponema Pallidum: NEGATIVE

## 2022-10-22 LAB — MAGNESIUM: Magnesium: 2 mg/dL (ref 1.5–2.5)

## 2022-10-22 LAB — HEPATITIS C ANTIBODY: HCV Ab: NONREACTIVE

## 2022-10-22 LAB — CORTISOL: Cortisol: 8.2 ug/dL

## 2022-10-22 LAB — VARICELLA ZOSTER ANTIBODY, IGM: Varicella IgM: <0.91 index (ref 0.00–0.90)

## 2022-10-22 LAB — PHOSPHORUS: Phosphorus: 4.2 mg/dL (ref 2.1–4.7)

## 2022-10-22 LAB — HEMOGLOBIN A1C: Hemoglobin A1C: 5.1 % (ref 4.0–5.6)

## 2022-10-22 LAB — HEPATITIS A IGM: Hep A IgM: NONREACTIVE

## 2022-10-22 LAB — HIV 1+2 ANTIBODY/ANTIGEN WITH REFLEX: HIV 1+2 AB/AGN: NONREACTIVE

## 2022-10-22 MED ORDER — ketorolac (TORADOL) injection 15 mg
15 | Freq: Three times a day (TID) | INTRAMUSCULAR
Start: 2022-10-22 — End: 2022-10-25
  Administered 2022-10-22 – 2022-10-25 (×10): via INTRAVENOUS

## 2022-10-22 MED ORDER — valproate (DEPACON) 250 mg in sodium chloride 0.9 % 50 mL IVPB
500 | Freq: Three times a day (TID) | INTRAVENOUS
Start: 2022-10-22 — End: 2022-10-25
  Administered 2022-10-22 – 2022-10-25 (×9): via INTRAVENOUS

## 2022-10-22 MED ORDER — cyanocobalamin (VITAMIN B-12) injection 1,000 mcg
1000 | Freq: Every day | INTRAMUSCULAR
Start: 2022-10-22 — End: 2022-10-25
  Administered 2022-10-22 – 2022-10-25 (×4): via SUBCUTANEOUS

## 2022-10-22 MED ORDER — metoclopramide HCl (REGLAN) injection 10 mg
5 | Freq: Three times a day (TID) | INTRAMUSCULAR
Start: 2022-10-22 — End: 2022-10-25
  Administered 2022-10-22 – 2022-10-25 (×10): via INTRAVENOUS

## 2022-10-22 MED ORDER — valproate (DEPACON) 1,500 mg in sodium chloride 0.9 % 50 mL IVPB
500 | Freq: Once | INTRAVENOUS | Status: AC
Start: 2022-10-22 — End: 2022-10-22
  Administered 2022-10-22: 18:00:00 via INTRAVENOUS

## 2022-10-22 MED FILL — VALPROATE SODIUM 500 MG/5 ML (100 MG/ML) INTRAVENOUS SOLUTION: 500 500 mg/5 mL (100 mg/mL) | INTRAVENOUS | Qty: 2.5

## 2022-10-22 MED FILL — METOCLOPRAMIDE 5 MG/ML INJECTION SOLUTION: 5 5 mg/mL | INTRAMUSCULAR | Qty: 2

## 2022-10-22 MED FILL — TYLENOL 325 MG TABLET: 325 325 mg | ORAL | Qty: 2

## 2022-10-22 MED FILL — CYANOCOBALAMIN (VIT B-12) 1,000 MCG/ML INJECTION SOLUTION: 1000 1,000 mcg/mL | INTRAMUSCULAR | Qty: 1

## 2022-10-22 MED FILL — SODIUM CHLORIDE 0.9 % INTRAVENOUS SOLUTION: 50.0000 50.0000 mL/hr | INTRAVENOUS | Qty: 1000

## 2022-10-22 MED FILL — KETOROLAC 15 MG/ML INJECTION SOLUTION: 15 15 mg/mL | INTRAMUSCULAR | Qty: 2

## 2022-10-22 MED FILL — VALPROATE SODIUM 500 MG/5 ML (100 MG/ML) INTRAVENOUS SOLUTION: 500 500 mg/5 mL (100 mg/mL) | INTRAVENOUS | Qty: 15

## 2022-10-22 MED FILL — HEPARIN (PORCINE) 5,000 UNIT/ML INJECTION SOLUTION: 5000 5,000 unit/mL | INTRAMUSCULAR | Qty: 1

## 2022-10-22 NOTE — Procedures (Signed)
Identifying Information:  Name: Jhayden Kutcher  MRN: [id number]  DOB: 1985-08-30  Interpreting Physician: Elmarie Shiley, MD  Referring Physician: Joylene John, MD  Date of EEG: 10/22/2022  Procedure Location: Inpatient     Clinical History:  Temiloluwa Spurlock is a 37 y.o. male with a reported history of possible seizures      Technical Summary:  20 channels of EEG were recorded in a digital format on a patient who was reported to be awake during the recording.     The background rhythm consisted of 8 Hz alpha activity, maximal over the posterior head regions and reactive to eye opening and closure. Photic stimulation and hyperventilation were performed and produced no abnormalities. During the recording stage II sleep was not seen. The EKG lead revealed no rhythm abnormalities.    EEG Interpretation:   This EEG was within normal limits for a patient of this age in the awake state.     No focal, lateralizing, or epileptiform features were seen during the recording.    No electrographic seizures were seen on this recording.    Clinical correlation is recommended.

## 2022-10-22 NOTE — Progress Notes (Signed)
Physical Therapy  Initial Assessment and Discharge     Name: David Jordan  DOB: [birth year]  Attending Physician: Joylene John, MD  Admission Diagnosis: Episode of loss of consciousness [R55]  Date: 10/22/2022  Reviewed Pertinent hospital course: Yes  Hospital Course PT/OT: David Jordan is a 37 y.o. male w/ past medical history of migraine headaches, and episodes of altered mental status that include memory loss, bilateral extremity numbness and weakness who presents to [facility] secondary to abdominal pain, worsening bilateral lower extremity numbness and weakness, memory loss and intractable headache.  Precautions: fll precautions, seizure precautions  Activity Level: Activity as tolerated  Assessment  PT 6 Clicks  Help From Another Person Turning From Back to Side While Flat in Bed Without Using Siderails: None  Help From Another Person Moving From Lying On Back To Sitting Without Using Siderails: None  Help From Another Person Moving To And From Bed To Chair: None  Help From Another Person Standing Up From Chair Using Your Arms: None  Help From Another Person To Walk In Hospital Room: None  Help From Another Person Climbing 3-5 Steps With A Railing: None  PT 6 Clicks Score: 24  Assessment: No impairments     Co-treatment performed: to integrate multiple skills simultaneously in order to challenge patient and advance progress  Prognosis: Excellent    Goals   Collaborated with: Patient  Patient Stated Goal: to go home  Goals to be met by:  (no acute PT goals set as pt is independent for all mobility assessed.  Will DC from acute PT)   Recommendation  Plan  PT Frequency: One time visit--discharge from PT    Recommendation  Recommendation: Anticipate no further PT needed after discharge  Problem List  There is no problem list on file for this patient.     Past Medical History  Past Medical History:   Diagnosis Date    Allergy     Seasonal    Bilateral arm weakness     Headache     Memory loss      Migraines June 2024      Past Surgical History  Past Surgical History:   Procedure Laterality Date    PR EEG PHYS/QHP 2-12 HR WITH VEEG  09/22/2022    WISDOM TOOTH EXTRACTION       Home Living/Prior Function  Patient able to provide accurate information at this time: Yes  Lives With: Spouse (and child)  Type of Home: House  Home Entry: More than 1 step to enter  Stairs to enter: 3  Home Layout: Multi-level;Bed/bath upstairs;Able to live on main level with bedroom/bathroom  Stairs within: full flight between floors  Bathroom Shower/Tub: Tub/shower unit  Bathroom Toilet: Standard  Bathroom Equipment: none;Hand-held shower  Home Equipment: None  Prior Function  Functional Mobility: Independent ( no assistive device)  Receives Help From: None needed prior to admission  ADL Assistance: Independent  IADL Assistance: Independent     Pain  Pain Score: 0 - No Pain  Therapist reported pain to: RN monitoring    Vision  Hearing/Vision/Perception  Hearing: No hearing deficits noted  Baseline Vision: Wears glasses all the time     Cognition  Overall Cognitive Status: Within Functional Limits  Cognitive Assessment: Arousal/ Alertness;Orientation Level  Arousal/Alertness: Alert  Orientation Level: Oriented X4    Neuromuscular  Overall Sensation: Patient denies any numbness/ tingling in BUE's/ BLEs  Additional Comments: denies N/T right now, but reports it comes and goes  Upper Extremity  UE Assessment: Defer to OT evaluation for formal assessment    Lower Extremity  Lower Extremity  LE Assessment: Strength WFL (at least 3+/5) as observed during functional activity;ROM grossly [facility];Tone normal      Functional Mobility  Bed Mobility   Supine to Sit: Independent  Sit to Supine: Independent  Transfers  Sit to Stand: Independent  Stand to Sit: Independent  Gait  Distance (in feet): 450 ft  Level of assistance: Independent  Assistive Device: None  Gait Characteristics: Steady;swing-through pattern;No LOB  Stairs  Number of Stairs:  8  Stair Management Assistance: Independent  Stair Management Technique-Ascending: No rails;With gait belt;Forwards;Alternating pattern  Stair Management Technique- Descending: One rail R;With gait belt;Forwards;Alternating pattern  Balance  Sitting - Static: Independent  Sitting-Dynamic: Independent   Standing-Static: Independent  Standing-Dynamic: Independent     Position after Therapy and Safety Handoff  Position after treatment and safety handoff  Position after therapy session: Bed  Details: RN notified;Call light/ needs within reach;visitor present  Alarms: Bed  Alarms Status: Not needed-patient on Universal fall risk precautions with other interventions in place      Patient Education    PT provided education on benefits of skilled physical therapy and risks of immobility.  Patient verbalized good understanding.   Time  Start Time: 1447  Stop Time: 1510  Time Calculation (min): 23 min    Charges   $PT Evaluation Low Complex 20 Min: 1 Procedure     CPT codes  A. Personal factors and/or Comorbidities / Patient History that impacts plan of care:  See above PMH / PSH / and problem list.   Low Complexity: 0        B. An examination of body systems(s) musculoskeletal, neuromuscular, cardiovascular/pumonary, integumentary using standardized tests and measures:  Refer to above examination for details.     Low Complexity: 1-2 elements     C. A clinical presentation with:   Low Complexity:  Stable and/or uncomplicated Characteristics     D. Clinical decision making using standardized patient assessment instrument and/or measurable assessment of functional outcome.   Low Complexity 97161       Jimmy Picket, PT  Physical Therapy License #[id number]  10/22/2022

## 2022-10-22 NOTE — Consults (Signed)
Neurology Attending Consult Note:    Reason for Consult: headache  Dr Joylene John, MD asked me to see David Jordan in consultation for evaluation of headache  Chief complaint: headache    History of Present Illness:  David Jordan is a 37 y.o. male who presents with with neurologic symptoms that began rather abruptly on June 13th of this year (prior to that only some allergies and mild sinus headaches).  He awoke in the middle of the night with right arm numbness that was followed by headache.    His wife described an episode june19th when he felt odd and stood up from the dinner table with 4 limb loss of sensation and nausea.  She noted that for about 2 hours he was confused and she think he was hallucinating as he was talking to himself and acting strangely.  He has no recollection of this.  He presented to the hospital on 6/20 for evaluation which was negative including cvEEG for 24 hours that was normal.    Since this he has had headache that is pressure like and continuous involviing the whole head.  He reports some neck pain.  Some light sensitivity.  Not positional.  Low-grade fever noted of 99.  He has nausea that is pretty constant as well.    Medical History:  Past Medical History:   Diagnosis Date    Allergy     Seasonal    Bilateral arm weakness     Headache     Memory loss     Migraines June 2024     Past Surgical History:   Procedure Laterality Date    PR EEG PHYS/QHP 2-12 HR WITH VEEG  09/22/2022    WISDOM TOOTH EXTRACTION       Medications Prior to Admission   Medication Sig Dispense Refill Last Dose    ibuprofen (MOTRIN) 200 MG tablet Take 1 tablet (200 mg total) by mouth every 6 hours as needed for Pain.       methylPREDNISolone (MEDROL, PAK,) 4 mg tablet follow package directions 21 each 0     atogepant (QULIPTA) 60 mg Tab Take 1 tablet (60 mg total) by mouth daily. 30 tablet 5     blood sugar diagnostic (GLUCOSE BLOOD) Strp Use to test blood sugar 1 times a day. Dx: hypoglycemia . Brand  per pharmacy / insurance preference. 100 strip 3     blood-glucose meter (GLUCOSE MONITORING KIT) kit Use to test blood sugar 1 times a day. Dx: hypoglycemia . Brand per pharmacy / insurance preference. 1 kit 0     lancets Misc Use to test blood sugar 1 times a day. Dx: hypoglycemia . Brand per pharmacy / insurance preference. 100 each 3     lancets Misc Use to test blood sugar 1 times a day. Dx: hypoglycemia . Brand per pharmacy / insurance preference. 120 each 11      Allergies   Allergen Reactions    Codeine Hives and Nausea And Vomiting     Hives and Fever    Shellfish Derived Swelling and Rash     Family History   Problem Relation Age of Onset    Diabetes Father     Heart disease Father     Cancer Neg Hx     Kidney disease Neg Hx     Stroke Neg Hx      Social History     Tobacco Use   Smoking Status Never   Smokeless Tobacco Never  Social History     Substance and Sexual Activity   Drug Use Never     Social History     Substance and Sexual Activity   Alcohol Use Not Currently     ROS:  Constitutional- low grade fevers  Eyes- No diplopia. No marked photophobia.  Ears/nose/throat- No dysphagia. No Dysarthria  Gastrointestinal-  Abdominal pain.  nausea  Genitourinary- No incontinence. No urinary retention  Neurologic- + Headache.    Exam:  Blood pressure 125/79, pulse 67, temperature 98.3 F (36.8 C), temperature source Oral, resp. rate 16, height 5' 9 (1.753 m), weight 135 lb (61.2 kg), SpO2 100%.  Constitutional   Vital signs: BP, HR, and RR reviewed   General Alert, no distress, well-nourished, not toxic appearing.  Eyes: fundoscopic exam revealed no hemorrhage or severe edema but can't rule out mild disc edema with small cup.  Psychiatric: cooperative with examination, no obvious hallucinations noted.  no psychotic behavior noted.  Mental status:               orientation to person and place as well as month and day of week.   General fund of knowledge grossly intact. Able to read the clock   Memory  grossly intact.  5 of 5 recall at 5 min on MOCA 7.3 words   Attention intact as able to attend well to the exam and was able to perform multiple step coin manipulations    Language fluent in conversation.     Comprehension intact; follows simple commands as well as able to perform multiple step commands crossing midline.  Cranial nerves:   CN2: Visual Fields full on confrontational testing,   CN 3,4,6: extraocular muscles intact  CN7:face symmetric without dysarthria,   CN8: hearing grossly intact   Strength: No pronator drift.  good strength in all 4 extremities   Deep tendon reflexes: brisk in all 4 extremities  Sensory: light touch intact in all 4 extremities.  Vibration mildly decreased in toes  Cerebellar/coordination: finger nose finger without ataxia  Tone: normal in all 4 extremities  Gait: Normal romberg.    Tests reviewed today:    CT ab/pelvis  IMPRESSION:   No acute abnormality in the abdomen and pelvis.     ESR and CRP both normal in ED    B12-207, 190  Normal B6, b1, TSH and FT4      Assessment:  David Jordan is a 37 y.o. male who presents with with neurologic symptoms that began rather abruptly on June 13th of this year (prior to that only some allergies and mild sinus headaches) that consist of headache, nausea, abdominal pain, and 4 limb sensory symptoms.  While a new onset migrainous disorder is very possible we would need a longer duration of follow up to feel comfortable with this diagnosis and should investigate other etiologies.  The subacute nature suggests possible inflammatory or atypical infectious etiology.  Cerebral venous thrombosis or inflammatory vasculopathy could present in this manner.  Functional disorder is not excluded.  Imaging and spinal fluid will be helpful in excluding these conditions.      Plan  -Will replace B12 with 5 days of parental 1000 mcg daily given low level  -depakote load and will continue Q8H scheduled with toradol and reglan to help with headache.  Trying  to hold off on steroids.  -MRI w/wo, MRV head, cervical and thoracic MRIs will need to be scheduled with anesthesia and ideally neuro IR for LP  -CSF and serum studies  ordered.    Donnamarie Poag, MD    Portions of this note may have been copied forward with edits on  10/22/2022.  I have reviewed and updated the history, physical exam, data, assessment, and plan of the note so that it reflects my current evaluation and management of the patient.     I personally reviewed the patient's old records, current laboratory and images as outlined above, and discussed the history with the patient and/or family.

## 2022-10-22 NOTE — Progress Notes (Signed)
[facility]  Progress Note    Admit Date: 10/21/2022    Patient: David Jordan  Date of service: 10/22/2022    Chief complaint, reason for follow up:     David Jordan is a 37 y.o. male on hospital day 0.  The principal reason for today's follow up visit is Intermittent B/L LE numbness,weakness,Abd pain,nausea       Subjective:     I personally reviewed vitals, labs, staff/progress notes.    Interval History:   Interval ancillary notes reviewed. Still having intermittent abd pain and nausea. No fever or vomiting.Having intermittent episodes of memory loss,B/L LE numbness/weakness,generalized weakness.No fever or chills.        Brief ROS:   ROS negative except as above    Physical Exam:   BP 125/79 (BP Location: Left upper arm, Patient Position: Lying)   Pulse 67   Temp 98.3 F (36.8 C) (Oral)   Resp 16   Ht 5' 9 (1.753 m)   Wt 135 lb (61.2 kg)   SpO2 100%   BMI 19.94 kg/m   O2 Flow Rate (L/min): 0 L/min       Intake/Output Summary (Last 24 hours) at 10/22/2022 1219  Last data filed at 10/21/2022 2301  Gross per 24 hour   Intake 2060 ml   Output --   Net 2060 ml       General Appearance:  Alert, cooperative    Head:  Normocephalic, atraumatic   Eyes:  Pupils equal, sclera nonicteric   Throat:  Moist mucus membranes    Neck:  Appears normal, trachea midline   Lungs:  Clear to auscultation bilaterally, respirations unlabored    Heart:  Regular rate and rhythm, S1 and S2 normal   Abdomen:  Soft, ttp B/L LQ,no guarding or rigidity, bowel sounds active all four quadrants   Extremities:  Extremities normal, no edema   Pulses:  2+ and symmetric    Skin:  Warm and dry   Neurologic:  Face symmetric, speech fluent, no tremor,oriented x3,motor strength 5/5 all extremities       Current Meds:     Current Facility-Administered Medications   Medication Dose Frequency Provider Last Admin    acetaminophen  650 mg Q4H PRN Shari Heritage, DO 650 mg at 10/22/22 1210    albuterol  2.5 mg RT Q4H PRN Jose  Torres, DO      dextrose 10% in water  12.5 g Q15 Min PRN [place], DO      Or    dextrose 10% in water  25 g Q15 Min PRN Padroni, DO      glucose  12 g Q15 Min PRN [place], DO      heparin  5,000 Units 3 times per day Shari Heritage, DO      insulin lispro  0-5 Units TID [facility] Jose Torres, DO      ondansetron  4 mg Q4H PRN Shari Heritage, DO      sodium chloride 0.9 %  50 mL/hr Continuous [address], DO 50 mL/hr at 10/22/22 0004          Labs:     Recent Labs     10/21/22  1643 10/22/22  0621   WBC 5.7 5.9   HGB 13.8 12.7*   HCT 39.9 37.8*   PLT 261 232  Recent Labs     10/21/22  1643 10/22/22  0621   NA 139 141   K 3.7 3.7   CL 101 106   CO2 28 32   BUN 16 11   CREATININE 0.95 0.83   GLUCOSE 104* 82     No results for input(s): INR in the last 72 hours.              Component Value Date/Time    POCGMD 97 10/22/2022 1209    POCGMD 87 10/22/2022 0913    POCGMD 88 10/21/2022 2337        Assessment and Plan:   David Jordan is a 38 y.o. male      On hospital day 0.   Current problems include:    Transient altered mental status/Intermittent ext weakness/Numbness/Headache: Rule out underlying demyelinating disease, seizure activity, occult infection.Case discussed with neurology.He will need MRI/MRV head and C/T spine under anesthesia.CSF/Serum labs ordered neurology.Will follow    Migraine headaches with aura- Might be contributing to his symptoms.Started on Valproic acid,reglan,toradol per neurology    Abdominal pain: CT abd non acute. Afebrile,no leucocytosis-IBS versus IBD    History of hypoglycemia: Rule out underlying endocrinopathy- Monitor BG.AM cortison/TSH WNL    Dehydration    Disposition:     Continue current care    Electronically signedAlvina Chou [facility]  10/22/2022  12:19 PM  Hospitalist

## 2022-10-22 NOTE — Plan of Care (Signed)
Problem: Daily Care  Goal: Daily care needs are met  Description: Assess and monitor ability to perform self care and identify potential discharge needs.  Outcome: Progressing     Problem: Psychosocial Needs  Goal: Demonstrates ability to cope with hospitalization/illness  Description: Assess and monitor patients ability to cope with his/her illness.  Outcome: Progressing     Problem: Safety  Goal: Patient will be injury free during hospitalization  Description: Assess and monitor vitals signs, neurological status including level of consciousness and orientation. Assess patient's risk for falls and implement fall prevention plan of care and interventions per hospital policy.      Ensure arm band on, uncluttered walking paths in room, adequate room lighting, call light and overbed table within reach, bed in low position, wheels locked, side rails up per policy, and non-skid footwear provided.   Outcome: Progressing    Problem: Discharge Barriers  Goal: Patient's discharge needs are met  Description: Collaborate with interdisciplinary team and initiate plans and interventions as needed.   Outcome: Progressing

## 2022-10-22 NOTE — Progress Notes (Signed)
Occupational Therapy  Initial Assessment and Discharge     Name: David Jordan  DOB: 1985/11/23  Attending Physician: Joylene John, MD  Admission Diagnosis: Episode of loss of consciousness [R55]  Date: 10/22/2022  Reviewed Pertinent hospital course: Yes  Hospital Course PT/OT: Jayme Brumlow is a 37 y.o. male w/ past medical history of migraine headaches, and episodes of altered mental status that include memory loss, bilateral extremity numbness and weakness who presents to [facility] secondary to abdominal pain, worsening bilateral lower extremity numbness and weakness, memory loss and intractable headache.  Precautions: fll precautions, seizure precautions  Activity Level: Activity as tolerated    Assessment  OT 6 Clicks  Help From Another Person To Put On/Take Off Regular Lower Body Clothing: None  Help From Another Person Bathing ( including washing ,rinsing,drying): None  Help From Another Person Toileting, which includes using the toilet, bedpan or urinal: None  Help From Another Person To Put On/Take Off Regular Upper Body Clothing: None  Help From Another Person Taking Care Of Personal Grooming: None  Help From Another Person Eating Meals: None  OT 6 Clicks Score: 24  Assessment: No acute impairments        Co-treatment performed: to integrate multiple skills simultaneously in order to challenge patient and advance progress       Prognosis for OT goals: Good                        Goals  Goals to be met in: eval/dc  Collaborated with: Patient    Recommendation  Plan  OT Frequency: One-time visit--discharge from OT  Recommendation  Recommendation: No skilled OT  Equipment Recommendations: Patient already has needed DME      Problem List  There is no problem list on file for this patient.       Past Medical History  Past Medical History:   Diagnosis Date    Allergy     Seasonal    Bilateral arm weakness     Headache     Memory loss     Migraines June 2024        Past Surgical History  Past Surgical  History:   Procedure Laterality Date    PR EEG PHYS/QHP 2-12 HR WITH VEEG  09/22/2022    WISDOM TOOTH EXTRACTION         Home Living/Prior Function  Patient able to provide accurate information at this time: Yes  Lives With: Spouse (and child)  Type of Home: House  Home Entry: More than 1 step to enter  Stairs to enter: 3  Home Layout: Multi-level;Bed/bath upstairs;Able to live on main level with bedroom/bathroom  Stairs within: full flight between floors  Bathroom Shower/Tub: Tub/shower unit  Bathroom Toilet: Standard  Bathroom Equipment: none;Hand-held shower  Home Equipment: None  Prior Function  Functional Mobility: Independent ( no assistive device)  Receives Help From: None needed prior to admission  ADL Assistance: Independent  IADL Assistance: Independent     Pain  Pain Score: 0 - No Pain  Therapist reported pain to: RN monitoring     Vision  Hearing: No hearing deficits noted  Baseline Vision: Wears glasses all the time    Cognition  Overall Cognitive Status: Within Functional Limits  Cognitive Assessment: Arousal/ Alertness;Orientation Level  Arousal/Alertness: Alert  Orientation Level: Oriented X4    Neuromuscular     Overall Sensation: Patient denies any numbness/ tingling in BUE's/ BLEs  Additional Comments: denies N/T right  now, but reports it comes and goes          Right Upper Extremity   Right UE ROM: Grossly WFL as observed during functional activities  Right UE Strength: Grossly WFL (at least 3+/5) as observed during functional activities  Right UE Muscle Tone: Normal  Right Hand Function: Grossly WFL as observed during functional activity         Left Upper Extremity  Left UE ROM: Grossly WFL as observed during functional activities  Left UE Strength: Grossly WFL (at least 3+/5) as observed during functional activities  Left UE Muscle Tone: Normal  Left UE Hand Function: Grossly WFL as observed during functional activites         Functional Mobility  Bed Mobility   Rolling: Independent  Supine to  Sit: Independent  Sit to Supine: Independent  Transfers  Sit to Stand: Independent  Stand to Sit: Independent  Bed to Chair: Independent  Chair  to Bed: Independent  Toilet Transfers: Independent  Functional Mobility: Independent  Balance  Sitting - Static: Independent  Sitting-Dynamic: Independent   Standing-Static: Independent  Standing-Dynamic: Independent    ADL  Grooming: Independent  Location Assessed Grooming: Standing at sink  Lower Body Dressing: Independent  Location Assessed LE Dressing: Seated edge of bed  Toileting: Independent         Position after Treatment and Safety Handoff  Position after therapy session: Bed  Details: RN notified;visitor present;Call light/ needs within reach  Alarms: Bed  Alarms Status: Not needed-patient on Universal fall risk precautions with other interventions in place    Patient Education  Pt. educated on the role of OT/POC, functional transfers, d/c recommendations and safety with ADLs. Pt demo good understanding of education.     Will D/C OT with above recommendations and functional status, pt functional w/ independent level of assistance for ADLS/mobility. No goals set d/t eval and discharge. Pt does not demonstrate needs for acute OT.        Time  Start Time: 1447  Stop Time: 1510  Time Calculation (min): 23 min    Charges  $OT Evaluation Low Complex 30 Min: 1 Procedure     97165- Low Complexity Evaluation  This evaluation code was determined based on analysis of pt's occupational profile, performance deficits, and level of clinical decision-making to complete OT evaluation      **Occupational Profile: Low Complexity  Brief chart review      **Performance deficits: Low Complexity  1-3 performance deficits      **Clinical Decision-making: Low Complexity     Co-morbidities affecting pt's current performance: see above    Current level of physical/ verbal assistance:   Min physical/verbal assistance       Jeronimo Norma OTR/L  10/22/2022

## 2022-10-23 LAB — BASIC METABOLIC PANEL
Anion Gap: 4 mmol/L (ref 3–16)
BUN: 12 mg/dL (ref 7–25)
CO2: 30 mmol/L (ref 21–33)
Calcium: 9 mg/dL (ref 8.6–10.3)
Chloride: 106 mmol/L (ref 98–110)
Creatinine: 0.87 mg/dL (ref 0.60–1.30)
EGFR: >90
Glucose: 81 mg/dL (ref 70–100)
Osmolality, Calculated: 289 mOsm/kg (ref 278–305)
Potassium: 3.9 mmol/L (ref 3.5–5.3)
Sodium: 140 mmol/L (ref 133–146)

## 2022-10-23 LAB — ANA ENA/SUBSEROLOGIES
Anti JO-1: <0.2 AI (ref 0.0–0.9)
Anti-Centromere B Antibodies: <0.2 AI (ref 0.0–0.9)
Anti-DNA (DS) Ab Qn: <1 IU/mL (ref 0–9)
Anti-Scleroderma-70: <0.2 AI (ref 0.0–0.9)
Antichromatin Antibodies: <0.2 AI (ref 0.0–0.9)
RNP Antibodies: <0.2 AI (ref 0.0–0.9)
Sjogren's Anti-SS-A: <0.2 AI (ref 0.0–0.9)
Sjogren's Anti-SS-B: <0.2 AI (ref 0.0–0.9)
Smith Antibodies: <0.2 AI (ref 0.0–0.9)

## 2022-10-23 LAB — CBC
Hematocrit: 38.1 % — ABNORMAL LOW (ref 38.5–50.0)
Hemoglobin: 13.1 g/dL — ABNORMAL LOW (ref 13.2–17.1)
MCH: 29.6 pg (ref 27.0–33.0)
MCHC: 34.3 g/dL (ref 32.0–36.0)
MCV: 86.2 fL (ref 80.0–100.0)
MPV: 6.9 fL — ABNORMAL LOW (ref 7.5–11.5)
Platelets: 227 10*3/uL (ref 140–400)
RBC: 4.42 10*6/uL (ref 4.20–5.80)
RDW: 13.2 % (ref 11.0–15.0)
WBC: 4.6 10*3/uL (ref 3.8–10.8)

## 2022-10-23 LAB — POC GLU MONITORING DEVICE
POC Glucose Monitoring Device: 85 mg/dL (ref 70–100)
POC Glucose Monitoring Device: 83 mg/dL (ref 70–100)
POC Glucose Monitoring Device: 97 mg/dL (ref 70–100)
POC Glucose Monitoring Device: 110 mg/dL — ABNORMAL HIGH (ref 70–100)

## 2022-10-23 MED FILL — KETOROLAC 15 MG/ML INJECTION SOLUTION: 15 15 mg/mL | INTRAMUSCULAR | Qty: 2

## 2022-10-23 MED FILL — HEPARIN (PORCINE) 5,000 UNIT/ML INJECTION SOLUTION: 5000 5,000 unit/mL | INTRAMUSCULAR | Qty: 1

## 2022-10-23 MED FILL — METOCLOPRAMIDE 5 MG/ML INJECTION SOLUTION: 5 5 mg/mL | INTRAMUSCULAR | Qty: 2

## 2022-10-23 MED FILL — SODIUM CHLORIDE 0.9 % INTRAVENOUS SOLUTION: 50.0000 50.0000 mL/hr | INTRAVENOUS | Qty: 1000

## 2022-10-23 MED FILL — VALPROATE SODIUM 500 MG/5 ML (100 MG/ML) INTRAVENOUS SOLUTION: 500 500 mg/5 mL (100 mg/mL) | INTRAVENOUS | Qty: 2.5

## 2022-10-23 MED FILL — CYANOCOBALAMIN (VIT B-12) 1,000 MCG/ML INJECTION SOLUTION: 1000 1,000 mcg/mL | INTRAMUSCULAR | Qty: 1

## 2022-10-23 MED FILL — ONDANSETRON HCL (PF) 4 MG/2 ML INJECTION SOLUTION: 4 4 mg/2 mL | INTRAMUSCULAR | Qty: 2

## 2022-10-23 NOTE — Plan of Care (Signed)
Problem: Safety  Goal: Patient will be injury free during hospitalization  Description: Assess and monitor vitals signs, neurological status including  level of consciousness and orientation. Assess patient's risk for falls and  implement fall prevention plan of care and interventions per hospital policy.    Ensure arm band on, uncluttered walking paths in room, adequate room lighting,  call light and overbed table within reach, bed in low position, wheels locked,  side rails up per policy, and non-skid footwear provided.  Outcome: Progressing    Problem: Patient will remain free of falls  Goal: Universal Fall Precautions  Outcome: Progressing    Problem: Daily Care  Goal: Daily care needs are met  Description: Assess and monitor ability to perform self care and identify  potential discharge needs.  Outcome: Progressing    Problem: Psychosocial Needs  Goal: Demonstrates ability to cope with hospitalization/illness  Description: Assess and monitor patients ability to cope with his/her illness.  Outcome: Progressing

## 2022-10-23 NOTE — Nursing Note (Signed)
Patient refused Heparin injections, he said he is been walking to the bathroom.

## 2022-10-23 NOTE — Progress Notes (Signed)
[facility] Progress Note      Date of visit: October 23, 2022  Patient Name: David Jordan  MRN:  [id number]  DOB:  1985/10/12   Chief Complaint: headache       Assessment   Virgie Damer is a 37 y.o. male who presents with with neurologic symptoms that began rather abruptly on June 13th of this year (prior to that only some allergies and mild sinus headaches) that consist of headache, nausea, abdominal pain, and 4 limb sensory symptoms.  While a new onset migrainous disorder is very possible we would need a longer duration of follow up to feel comfortable with this diagnosis and should investigate other etiologies.  The subacute nature suggests possible inflammatory or atypical infectious etiology.  Cerebral venous thrombosis or inflammatory vasculopathy could present in this manner.  Functional disorder is not excluded.  Imaging and spinal fluid will be helpful in excluding these conditions.     Plan     Will replace B12 with 5 days of parental 1000 mcg daily given low level  Continue Depakote 250 mg Q8H scheduled with toradol and reglan to help with headache.  Trying to hold off on steroids.  MRI w/wo, MRV head, cervical and thoracic MRIs will need to be scheduled with anesthesia and ideally neuro IR for LP  CSF and serum studies ordered.  Reviewed with attending neurologist, Dr. Donnamarie Poag. Neurology will continue to follow.      Subjective   Interval History 10/23/22:  Headache improved today with depakote and PRN toradol. Nausea is worse, doesn't think scheduled reglan has helped.     HPI (10/22/2022 from Dr. Donnamarie Poag):  David Jordan is a 37 y.o. male who presents with with neurologic symptoms that began rather abruptly on June 13th of this year (prior to that only some allergies and mild sinus headaches).  He awoke in the middle of the night with right arm numbness that was followed by headache.     His wife described an episode june19th when he felt odd and stood up from the dinner table with 4  limb loss of sensation and nausea.  She noted that for about 2 hours he was confused and she think he was hallucinating as he was talking to himself and acting strangely.  He has no recollection of this.  He presented to the hospital on 6/20 for evaluation which was negative including cvEEG for 24 hours that was normal.     Since this he has had headache that is pressure like and continuous involviing the whole head.  He reports some neck pain.  Some light sensitivity.  Not positional.  Low-grade fever noted of 99.  He has nausea that is pretty constant as well.       Medications and Allergies        Medication List        ASK your doctor about these medications        Quantity/Refills   blood-glucose meter kit  Commonly known as: glucose monitoring kit  Use to test blood sugar 1 times a day. Dx: hypoglycemia . Brand per pharmacy / insurance preference.   Quantity: 1 kit  Refills: 0     glucose blood test strip  Generic drug: blood sugar diagnostic  Use to test blood sugar 1 times a day. Dx: hypoglycemia . Brand per pharmacy / insurance preference.   Quantity: 100 strip  Refills: 3     ibuprofen 200 MG tablet  Commonly known as: MOTRIN  Take 1 tablet (200 mg total) by mouth every 6 hours as needed for Pain.   Refills: 0     * lancets Misc  Use to test blood sugar 1 times a day. Dx: hypoglycemia . Brand per pharmacy / insurance preference.   Quantity: 100 each  Refills: 3     * lancets Misc  Use to test blood sugar 1 times a day. Dx: hypoglycemia . Brand per pharmacy / insurance preference.   Quantity: 120 each  Refills: 11     methylPREDNISolone 4 mg tablet  Commonly known as: Medrol (Pak)  follow package directions   Quantity: 21 each  Refills: 0     Qulipta 60 mg Tab  Generic drug: atogepant  Take 1 tablet (60 mg total) by mouth daily.   Quantity: 30 tablet  Refills: 5           * This list has 2 medication(s) that are the same as other medications prescribed for you. Read the directions carefully, and ask your  doctor or other care provider to review them with you.                Scheduled Meds:   cyanocobalamin  1,000 mcg Subcutaneous Daily 0900    heparin  5,000 Units Subcutaneous 3 times per day    insulin lispro  0-5 Units Subcutaneous TID AC    ketorolac  30 mg Intravenous Q8H    metoclopramide HCl  10 mg Intravenous Q8H    valproate (DEPACON) 250 mg in sodium chloride 0.9 % 50 mL IVPB  250 mg Intravenous Q8H     Continuous Infusions:   sodium chloride 0.9 % 50 mL/hr (10/22/22 2035)     PRN Meds:.acetaminophen, albuterol, dextrose 10% in water **OR** dextrose 10% in water, glucose, ondansetron  Allergies   Allergen Reactions    Codeine Hives and Nausea And Vomiting     Hives and Fever    Shellfish Derived Swelling and Rash     Vitals     Vitals:    10/22/22 2032 10/22/22 2359 10/23/22 0351 10/23/22 0818   BP: 104/66 99/59 105/55 114/59   BP Location: Left upper arm Left upper arm Left upper arm Left upper arm   Patient Position: Lying Lying Lying Lying   Pulse: 68 67 57 78   Resp: 16 16 16 16    Temp: 98 F (36.7 C) 97.9 F (36.6 C) 97.6 F (36.4 C) 97.9 F (36.6 C)   TempSrc: Oral Oral Oral Oral   SpO2: 99% 99% 97% 95%   Weight:       Height:         Temp:  [97.6 F (36.4 C)-98.3 F (36.8 C)] 97.9 F (36.6 C)  Heart Rate:  [57-78] 78  Resp:  [16] 16  BP: (99-125)/(55-79) 114/59    Physical Exam   Appearance:   Well groomed, well nourished patient in no acute distress.  Language/Speech:   Fluent language.  No word finding difficulty or paraphasic errors.  Able to name simple objects.  Able to repeat.  Able to follow 2 step commands.  No neglect or gaze preference.  Attention/concentration:   Awake and able to attend to the examiner.  Able to provide a history with memory intact to recent events.  Orientation:   Oriented to self, year, month, day, date, situation.      CN II Pupils are equal, round and reactive.  No afferent pupillary defect.      CN  III,IV,VI Eyes are aligned in primary gaze.  No  skew.  Extraocular eye movements intact, full vertical and horizontal duction.No nystagmus.    CN V Sensation is intact V1-V3 bilaterally to light touch.    CN VII Facial movements are symmetric at rest and with activation.  No dysarthria.    CN VIII Hearing is intact to conversational tone.    CN IX & X Soft palate elevates symmetrically.    CN XI Shoulder shrug strength 5/5 bilaterally .    CN XII Tongue protrudes midline.     Motor:   Normal tone and bulk  No tremors and myoclonus    5/5 power in all 4 extremities    Sensation:  light touch intact bilaterally upper and lower extremities    Coordination:  no dysmetria or tremor noted    Labs     Lab Results   Component Value Date    GLUCOSE 81 10/23/2022    BUN 12 10/23/2022    CO2 30 10/23/2022    CREATININE 0.87 10/23/2022    K 3.9 10/23/2022    NA 140 10/23/2022    CL 106 10/23/2022    CALCIUM 9.0 10/23/2022       Lab Results   Component Value Date    WBC 4.6 10/23/2022    HGB 13.1 (L) 10/23/2022    HCT 38.1 (L) 10/23/2022    MCV 86.2 10/23/2022    PLT 227 10/23/2022     No results found for: PTT, INR  Lab Results   Component Value Date    CHOLTOT 164 09/22/2022    TRIG 81 09/22/2022    HDL 46 (L) 09/22/2022    LDL 102 09/22/2022     Lab Results   Component Value Date    HGBA1C 5.1 10/22/2022       Lab Results   Component Value Date    COLORU Yellow 10/21/2022    CLARITYU Clear 10/21/2022    PROTEINUA Negative 10/21/2022    PHUR 6.0 10/21/2022    LABSPEC 1.028 10/21/2022    GLUCOSEU Negative 10/21/2022    BLOODU Negative 10/21/2022    LEUKOCYTESUR Negative 10/21/2022    NITRITE Negative 10/21/2022    BILIRUBINUR Negative 10/21/2022    UROBILINOGEN <2.0 10/21/2022       Lab Results   Component Value Date    METHADSCRNUR Negative 10/21/2022    BARBSCRNUR Negative 10/21/2022    BENZOSCRNUR Negative 10/21/2022    OPIATESCRNUR Presumptive Positive (A) 10/21/2022    THCSCRNUR Negative 10/21/2022    COCAINSCRNUR Negative 10/21/2022       Lab Results   Component  Value Date    CKTOTAL 92 09/21/2022     Imaging and Diagnostic Studies     CT head results:  CT Head WO contrast    Result Date: 09/21/2022  IMPRESSION: 1.  No acute intracranial abnormality. 2.  No intracranial mass effect or hemorrhage. Approved by Estanislado Pandy, MD on 09/21/2022 4:50 AM EDT I have personally reviewed the images and I agree with this report. Report Verified by: Letitia Caul, MD at 09/21/2022 5:06 AM EDT       I spent 35 minutes, providing care, interpreting results, documenting, and coordinating the patient's care. I personally reviewed the patient's previous medical records, current laboratory and images, and spent >50% of total time counseling the patient and/or family on their diagnosis, prognosis, medication regimen, other management and answering questions.    I contacted for ancillary history to aid in provision  of the patient's care.  I contacted to discuss (interpretation of test results, coordination of care).    Portions of this note may have been copied forward with edits on 10/23/2022.  I have reviewed and updated the history, physical exam, data, assessment, and plan of the note so that it reflects my evaluation and management of the patient.     Bertram Denver, CNP  [facility] Consult [facility]  10/23/2022

## 2022-10-23 NOTE — Consults (Signed)
[facility]  Care Management/Social Work Assessment      Patient Information     Patient Name: David Jordan  MRN: [id number]  Hospital Day: 0  Inpatient/Observation: Observation  Admit Date: 10/21/2022  Admission Diagnosis: Episode of loss of consciousness [R55]  Attending provider: Joylene John, MD    PCP: Venita Sheffield, PA  Home Pharmacy:              [facility] [id number] - [address] [place], [place] - [address]  [address]  [place] [address]  Phone: [phone]     [facility]  [address]  [address]  [place] [address]  Phone: [phone]        Pertinent Medications  Anticoagulation therapy: No  New Diabetic: No      Issues related to obtaining medications: no    Payor Information   Medical Insurance Coverage:  Payor: Advertising copywriter / Plan: Advertising copywriter CHOICE / Product Type: HMO /   Secondary Payor:     Functional Assessment   Functional Assessment  Assessment Information Obtained From:: Patient, Spouse, Chart Review  May We Obtain Collateral Information From Family, Friends and Neighbors?: Yes  How do you wish to be addressed?: David Jordan  Current Mental Status: Awake, Oriented to Person, Oriented to Place, Oriented to Time, Oriented to Situation  Activities of Daily Living: Independent  ADL Comments: no AD, active driver  Work History: Environmental education officer  Marital Status: Married  Number of children and their names: 5 yo child  Demographics Correct:: Yes    Current Living Arrangements   Current Living Arrangements  Current Living Arrangements: Home  Type of Housing: House  Who do you live with?: With Family  What family member?: spouse, 5 yo son  One Story or Two (check all that apply): Multi-Level, Live on first floor (no bathroom on main level, bedroom/full bath upstairs)  Enter the number of steps and rails to enter the residence: 3 STE  Enter the number of steps and rails inside the residence: full flight upstairs  History of  Falls?: No    Electrical engineer at Home: Not Warehouse manager Status & Connection to [employer]        Support Systems   Emergency contact: Extended Emergency Contact Information  Primary Emergency Contact: Brazeau,Amanda  Mobile Phone: [phone]  Relation: Spouse  Preferred language: English  Interpreter needed? No    Support Systems  Legal Status: N/A  Primary Caregiver: Self  Times of available support: Total 24/7 hands on (add comment) (if needed, could be provided)  Marital Status: Married  Number of children and their names: 70 yo child  Demographics Correct:: Yes  Expected Discharge Disposition: Home  Next of Kin: AmandaJjones  Next of Kin Relationship: Spouse  Next of Kin Phone Number: [phone]  Assessment Information Obtained From:: Patient, Spouse, Chart Review    Other Pertinent Information     HD #2     CM met with patient and spouse at bedside for discharge planning. Patient from home with spouse, child and independent for all self care. Noted PT/OT evaluations completed and no skilled PT/OT needed and both in agreement.  Patient will return home at discharge and denies any needs.    Advance Directives (For Healthcare)  Advance Directive: Patient does not have advance directive  No Advanced Directive: Patient would like information about advanced directives,  forgoing or with-drawing life-sustaining treatment, and withholding resuscitative services  Healthcare Agent Appointed: No  Pre-existing DNR/DNI Order: No  Patient Requests Assistance: Yes, advice to complete post discharge    Discharge Plan     Met with patient to initiate discussion regarding discharge planning. Introduced self and role of case management/social work and provided Tour manager.       Anticipated Discharge Plan: return home w/spouse, child, denies needs    Anticipated Discharge Date: ~2 days    Anticipated Transportation: spouse    Patient/Family aware and taking part in the  discharge plan.  Patient/family educated that once post-acute care needs have been identified, a provider list applicable to the identified post-acute care needs as well as the insurance provider will be provided, and patient/family have the freedom to choose their provider(s); financial interest(s) are disclosed as appropriate.         Su Hilt, RN  Phone Number: [phone]

## 2022-10-23 NOTE — Progress Notes (Signed)
[facility] Hospitalist Group  Progress Note    Admit Date: 10/21/2022    Patient: David Jordan  Date of service: 10/23/2022    Chief complaint, reason for follow up:     Alok Mezzanotte is a 37 y.o. male on hospital day 0.  The principal reason for today's follow up visit is intermittent B/L LE numbness/weakness,Abd pain       Subjective:     I personally reviewed vitals, labs, staff/progress notes.    Interval History:   Interval ancillary notes reviewed. No occurrence of B/L LE weakness/numbness since admission.Still having dull abd pain intermittently. No nausea or vomiting. Denies chest pain or SOB.    Patient still not at baseline.    Brief ROS:   ROS negative except as above      Physical Exam:   BP 108/66 (BP Location: Right upper arm, Patient Position: Lying)   Pulse 78   Temp 98 F (36.7 C) (Oral)   Resp 16   Ht 5' 9 (1.753 m)   Wt 135 lb (61.2 kg)   SpO2 97%   BMI 19.94 kg/m   O2 Flow Rate (L/min): 0 L/min       Intake/Output Summary (Last 24 hours) at 10/23/2022 1141  Last data filed at 10/23/2022 1122  Gross per 24 hour   Intake 50 ml   Output 0 ml   Net 50 ml       General Appearance:  Alert, cooperative    Head:  Normocephalic, atraumatic   Eyes:  Pupils equal, sclera nonicteric   Throat:  Moist mucus membranes    Neck:  Appears normal, trachea midline   Lungs:  Clear to auscultation bilaterally, respirations unlabored    Heart:  Regular rate and rhythm, S1 and S2 normal   Abdomen:  Soft, mild ttp LLQ,no guarding, bowel sounds active all four quadrants   Extremities:  Extremities normal, no edema   Pulses:  2+ and symmetric    Skin:  Warm and dry   Neurologic:  Face symmetric, speech fluent, no tremor,oriented x3,no focal deficits       Current Meds:     Current Facility-Administered Medications   Medication Dose Frequency Provider Last Admin    acetaminophen  650 mg Q4H PRN Shari Heritage, DO 650 mg at 10/22/22 1210    albuterol  2.5 mg RT Q4H PRN Shari Heritage, DO      cyanocobalamin   1,000 mcg Daily 0900 Harlin Rain, MD 1,000 mcg at 10/23/22 0905    dextrose 10% in water  12.5 g Q15 Min PRN Shari Heritage, DO      Or    dextrose 10% in water  25 g Q15 Min PRN [place], DO      glucose  12 g Q15 Min PRN [place], DO      heparin  5,000 Units 3 times per day Shari Heritage, DO      insulin lispro  0-5 Units TID [facility] Jose Torres, DO      ketorolac  30 mg Q8H Harlin Rain, MD 30 mg at 10/23/22 0544    metoclopramide HCl  10 mg Q8H Harlin Rain, MD 10 mg at 10/23/22 0544    ondansetron  4 mg Q4H PRN Shari Heritage, DO 4 mg at 10/23/22 1119    sodium chloride 0.9 %  50 mL/hr Continuous Shari Heritage, DO 50 mL/hr at 10/22/22 2035    valproate (DEPACON) 250 mg in sodium chloride 0.9 %  50 mL IVPB  250 mg Q8H Harlin Rain, MD 250 mg at 10/23/22 1128          Labs:     Recent Labs     10/21/22  1643 10/22/22  0621 10/23/22  0559   WBC 5.7 5.9 4.6   HGB 13.8 12.7* 13.1*   HCT 39.9 37.8* 38.1*   PLT 261 232 227                                                                  Recent Labs     10/21/22  1643 10/22/22  0621 10/23/22  0559   NA 139 141 140   K 3.7 3.7 3.9   CL 101 106 106   CO2 28 32 30   BUN 16 11 12    CREATININE 0.95 0.83 0.87   GLUCOSE 104* 82 81     No results for input(s): INR in the last 72 hours.              Component Value Date/Time    POCGMD 83 10/23/2022 0832    POCGMD 85 10/22/2022 2131    POCGMD 85 10/22/2022 1819    POCGMD 97 10/22/2022 1209    POCGMD 87 10/22/2022 0913        Assessment and Plan:   David Jordan is a 37 y.o. male      On hospital day 0.   Current problems include:    Transient altered mental status/Intermittent ext weakness/Numbness/Headache: Rule out underlying demyelinating disease, seizure activity, occult infection.Case discussed with neurology.He will need MRI/MRV head and C/T spine under anesthesia.CSF/Serum labs ordered neurology.MRI scheduled for Friday.No further recurrence of LE weakness since admission     Migraine headaches with aura- Might be  contributing to his symptoms.Started on Valproic acid,reglan,toradol per neurology     Abdominal pain: CT abd non acute. Afebrile,no leucocytosis-IBS versus IBD.Abd pain persists.No nausea or vomiting.No fever or chills.     History of hypoglycemia: Rule out underlying endocrinopathy- Monitor BG.AM cortison/TSH WNL     Dehydration    Disposition:     Continue inpatient care    Electronically signedAlvina Chou [facility]  10/23/2022  11:41 AM  Hospitalist

## 2022-10-23 NOTE — Plan of Care (Signed)
Home without needs

## 2022-10-24 LAB — CBC
Hematocrit: 36.4 % — ABNORMAL LOW (ref 38.5–50.0)
Hemoglobin: 12.6 g/dL — ABNORMAL LOW (ref 13.2–17.1)
MCH: 29.6 pg (ref 27.0–33.0)
MCHC: 34.7 g/dL (ref 32.0–36.0)
MCV: 85.1 fL (ref 80.0–100.0)
MPV: 7.2 fL — ABNORMAL LOW (ref 7.5–11.5)
Platelets: 230 10*3/uL (ref 140–400)
RBC: 4.27 10*6/uL (ref 4.20–5.80)
RDW: 12.9 % (ref 11.0–15.0)
WBC: 5.7 10*3/uL (ref 3.8–10.8)

## 2022-10-24 LAB — POC GLU MONITORING DEVICE
POC Glucose Monitoring Device: 160 mg/dL — ABNORMAL HIGH (ref 70–100)
POC Glucose Monitoring Device: 87 mg/dL (ref 70–100)

## 2022-10-24 MED FILL — SODIUM CHLORIDE 0.9 % INTRAVENOUS SOLUTION: 50.0000 50.0000 mL/hr | INTRAVENOUS | Qty: 1000

## 2022-10-24 MED FILL — VALPROATE SODIUM 500 MG/5 ML (100 MG/ML) INTRAVENOUS SOLUTION: 500 500 mg/5 mL (100 mg/mL) | INTRAVENOUS | Qty: 2.5

## 2022-10-24 MED FILL — HEPARIN (PORCINE) 5,000 UNIT/ML INJECTION SOLUTION: 5000 5,000 unit/mL | INTRAMUSCULAR | Qty: 1

## 2022-10-24 MED FILL — KETOROLAC 15 MG/ML INJECTION SOLUTION: 15 15 mg/mL | INTRAMUSCULAR | Qty: 2

## 2022-10-24 MED FILL — METOCLOPRAMIDE 5 MG/ML INJECTION SOLUTION: 5 5 mg/mL | INTRAMUSCULAR | Qty: 2

## 2022-10-24 MED FILL — CYANOCOBALAMIN (VIT B-12) 1,000 MCG/ML INJECTION SOLUTION: 1000 1,000 mcg/mL | INTRAMUSCULAR | Qty: 1

## 2022-10-24 NOTE — Plan of Care (Signed)
Problem: Safety  Goal: Patient will be injury free during hospitalization  Description: Assess and monitor vitals signs, neurological status including level of consciousness and orientation. Assess patient's risk for falls and implement fall prevention plan of care and interventions per hospital policy.      Ensure arm band on, uncluttered walking paths in room, adequate room lighting, call light and overbed table within reach, bed in low position, wheels locked, side rails up per policy, and non-skid footwear provided.   Outcome: Progressing     Problem: Daily Care  Goal: Daily care needs are met  Description: Assess and monitor ability to perform self care and identify potential discharge needs.  Outcome: Progressing     Problem: Psychosocial Needs  Goal: Demonstrates ability to cope with hospitalization/illness  Description: Assess and monitor patients ability to cope with his/her illness.  Outcome: Progressing

## 2022-10-24 NOTE — Progress Notes (Signed)
David Jordan  Progress Note    Admit Date: 10/21/2022    Patient: David Jordan  Date of service: 10/24/2022    Chief complaint, reason for follow up:     David Jordan is a 37 y.o. male on hospital day 1.  The principal reason for today's follow up visit is intermittent B/L LE numbness, Abd pain       Subjective:     I personally reviewed vitals, labs, staff/progress notes.    Interval History:   No overnight acute issues. Abd pain resolved per patient. No recurrence of LE/UE weakness/numbness since admission. No fever or chills.No vomiting or diarrhea.    Patient still not at baseline.    Brief ROS:   ROS negative except as above      Physical Exam:   BP 131/75 (BP Location: Left upper arm, Patient Position: Lying)   Pulse 71   Temp 98 F (36.7 C) (Oral)   Resp 16   Ht 5' 9 (1.753 m)   Wt 135 lb (61.2 kg)   SpO2 98%   BMI 19.94 kg/m   O2 Flow Rate (L/min): 0 L/min       Intake/Output Summary (Last 24 hours) at 10/24/2022 1520  Last data filed at 10/24/2022 4696  Gross per 24 hour   Intake 2817.04 ml   Output --   Net 2817.04 ml       General Appearance:  Alert, cooperative    Head:  Normocephalic, atraumatic   Eyes:  Pupils equal, sclera nonicteric   Throat:  Moist mucus membranes    Neck:  Appears normal, trachea midline   Lungs:  Clear to auscultation bilaterally, respirations unlabored ,no wheeze or crepts   Heart:  Regular rate and rhythm, S1 and S2 normal   Abdomen:  Soft, non-tender, bowel sounds active all four quadrants   Extremities:  Extremities normal, no edema   Pulses:  2+ and symmetric    Skin:  Warm and dry   Neurologic:  Face symmetric, speech fluent, no tremor,oriented x3,no focal deficits,Motor strength 5/5 all extremities       Current Meds:     Current Facility-Administered Medications   Medication Dose Frequency Provider Last Admin    acetaminophen  650 mg Q4H PRN Shari Heritage, DO 650 mg at 10/22/22 1210    albuterol  2.5 mg RT Q4H PRN Shari Heritage, DO       cyanocobalamin  1,000 mcg Daily 0900 Harlin Rain, MD 1,000 mcg at 10/24/22 0834    dextrose 10% in water  12.5 g Q15 Min PRN Shari Heritage, DO      Or    dextrose 10% in water  25 g Q15 Min PRN [place], DO      glucose  12 g Q15 Min PRN [place], DO      heparin  5,000 Units 3 times per day Shari Heritage, DO      insulin lispro  0-5 Units TID [facility] Jose Torres, DO      ketorolac  30 mg Q8H Harlin Rain, MD 30 mg at 10/24/22 1242    metoclopramide HCl  10 mg Q8H Harlin Rain, MD 10 mg at 10/24/22 1242    ondansetron  4 mg Q4H PRN Shari Heritage, DO 4 mg at 10/23/22 1119    sodium chloride 0.9 %  50 mL/hr Continuous Shari Heritage, DO 50 mL/hr at 10/24/22 1111    valproate (DEPACON) 250 mg in sodium chloride 0.9 %  50 mL IVPB  250 mg Q8H Harlin Rain, MD 250 mg at 10/24/22 1114          Labs:     Recent Labs     10/22/22  0621 10/23/22  0559 10/24/22  0637   WBC 5.9 4.6 5.7   HGB 12.7* 13.1* 12.6*   HCT 37.8* 38.1* 36.4*   PLT 232 227 230                                                                  Recent Labs     10/21/22  1643 10/22/22  0621 10/23/22  0559   NA 139 141 140   K 3.7 3.7 3.9   CL 101 106 106   CO2 28 32 30   BUN 16 11 12    CREATININE 0.95 0.83 0.87   GLUCOSE 104* 82 81     No results for input(s): INR in the last 72 hours.              Component Value Date/Time    POCGMD 160 (H) 10/23/2022 2122    POCGMD 110 (H) 10/23/2022 1737    POCGMD 97 10/23/2022 1311    POCGMD 83 10/23/2022 0832    POCGMD 85 10/22/2022 2131        Assessment and Plan:   David Jordan is a 37 y.o. male      On hospital day 1.   Current problems include:    Transient altered mental status/Intermittent ext weakness/Numbness/Headache: Rule out underlying demyelinating disease, seizure activity, occult infection.Case discussed with neurology.He will need MRI/MRV head and C/T spine under anesthesia.CSF/Serum labs ordered neurology.MRI scheduled for Friday.No further recurrence of LE weakness since admission.MRI under anesthesia  scheduled for tmw am. LP to be done as outpatient.     Migraine headaches with aura- Might be contributing to his symptoms.Started on Valproic acid,reglan,toradol per neurology     Abdominal pain: CT abd non acute. Afebrile,no leucocytosis-IBS versus IBD.Abd pain persists.No nausea or vomiting.No fever or chills.     History of hypoglycemia: Rule out underlying endocrinopathy- Monitor BG.AM cortison/TSH WNL     Dehydration    Disposition:     Can likely d/c tmrw if MRI brain/spine non acute. For LP as outpatient    Electronically signed,  David Jordan [facility]  10/24/2022  3:20 PM  Hospitalist

## 2022-10-24 NOTE — Progress Notes (Signed)
[facility] Progress Note      Date of visit: October 24, 2022  Patient Name: David Jordan  MRN:  [id number]  DOB:  1985-12-22   Chief Complaint: headache       Assessment   David Jordan is a 37 y.o. male who presents with with neurologic symptoms that began rather abruptly on June 13th of this year (prior to that only some allergies and mild sinus headaches) that consist of headache, nausea, abdominal pain, and 4 limb sensory symptoms.  While a new onset migrainous disorder is very possible we would need a longer duration of follow up to feel comfortable with this diagnosis and should investigate other etiologies.  The subacute nature suggests possible inflammatory or atypical infectious etiology.  Cerebral venous thrombosis or inflammatory vasculopathy could present in this manner.  Functional disorder is not excluded.  Imaging and spinal fluid will be helpful in excluding these conditions.     Testing unable to be completed until at least Friday 7/26 due to need for anesthesia. He had outpatient MRI brain scheduled with anesthesia that day prior to admission. No fever. Numbness has resolved. Feels headache is manageable. OK to d/c from a neurologic perspective and complete testing as an outpatient.     Plan     Continue B12 replacement at d/c  Continue Depakote 250 mg Q8H for headaches  MRI w/wo, MRV head, cervical and thoracic MRIs will need to be scheduled with anesthesia and ideally neuro IR for LP- orders changed for outpatient  CSF and serum studies ordered.  Reviewed with attending neurologist, Dr. Donnamarie Poag and hospitalist, Dr. Thedore Mins. Neurology sign off    Subjective   Interval History   10/24/22:  Headache up and down but feels it is manageable. Unable to coordinate any testing with anesthesia until at least Friday. Nausea is a little better today. Arm numbness has resolved.     10/23/22:  Headache improved today with depakote and PRN toradol. Nausea is worse, doesn't think scheduled reglan has  helped.     HPI (10/22/2022 from Dr. Donnamarie Poag):  David Jordan is a 37 y.o. male who presents with with neurologic symptoms that began rather abruptly on June 13th of this year (prior to that only some allergies and mild sinus headaches).  He awoke in the middle of the night with right arm numbness that was followed by headache.     His wife described an episode june19th when he felt odd and stood up from the dinner table with 4 limb loss of sensation and nausea.  She noted that for about 2 hours he was confused and she think he was hallucinating as he was talking to himself and acting strangely.  He has no recollection of this.  He presented to the hospital on 6/20 for evaluation which was negative including cvEEG for 24 hours that was normal.     Since this he has had headache that is pressure like and continuous involviing the whole head.  He reports some neck pain.  Some light sensitivity.  Not positional.  Low-grade fever noted of 99.  He has nausea that is pretty constant as well.       Medications and Allergies        Medication List        ASK your doctor about these medications        Quantity/Refills   blood-glucose meter kit  Commonly known as: glucose monitoring kit  Use to test blood sugar 1 times  a day. Dx: hypoglycemia . Brand per pharmacy / insurance preference.   Quantity: 1 kit  Refills: 0     glucose blood test strip  Generic drug: blood sugar diagnostic  Use to test blood sugar 1 times a day. Dx: hypoglycemia . Brand per pharmacy / insurance preference.   Quantity: 100 strip  Refills: 3     ibuprofen 200 MG tablet  Commonly known as: MOTRIN  Take 1 tablet (200 mg total) by mouth every 6 hours as needed for Pain.   Refills: 0     * lancets Misc  Use to test blood sugar 1 times a day. Dx: hypoglycemia . Brand per pharmacy / insurance preference.   Quantity: 100 each  Refills: 3     * lancets Misc  Use to test blood sugar 1 times a day. Dx: hypoglycemia . Brand per pharmacy / insurance  preference.   Quantity: 120 each  Refills: 11     methylPREDNISolone 4 mg tablet  Commonly known as: Medrol (Pak)  follow package directions   Quantity: 21 each  Refills: 0     Qulipta 60 mg Tab  Generic drug: atogepant  Take 1 tablet (60 mg total) by mouth daily.   Quantity: 30 tablet  Refills: 5           * This list has 2 medication(s) that are the same as other medications prescribed for you. Read the directions carefully, and ask your doctor or other care provider to review them with you.                Scheduled Meds:   cyanocobalamin  1,000 mcg Subcutaneous Daily 0900    heparin  5,000 Units Subcutaneous 3 times per day    insulin lispro  0-5 Units Subcutaneous TID AC    ketorolac  30 mg Intravenous Q8H    metoclopramide HCl  10 mg Intravenous Q8H    valproate (DEPACON) 250 mg in sodium chloride 0.9 % 50 mL IVPB  250 mg Intravenous Q8H     Continuous Infusions:   sodium chloride 0.9 % 50 mL/hr (10/23/22 1652)     PRN Meds:.acetaminophen, albuterol, dextrose 10% in water **OR** dextrose 10% in water, glucose, ondansetron  Allergies   Allergen Reactions    Codeine Hives and Nausea And Vomiting     Hives and Fever    Shellfish Derived Swelling and Rash     Vitals     Vitals:    10/23/22 1934 10/23/22 2323 10/24/22 0358 10/24/22 0811   BP: 128/77 112/54 111/63 118/70   BP Location: Left upper arm Left upper arm  Left upper arm   Patient Position: Lying Lying Lying Lying   BP Cuff Size: Regular      Pulse: 67 78 62 60   Resp: 16 16 16 17    Temp: 98.1 F (36.7 C) 97.9 F (36.6 C) 97.5 F (36.4 C) 97.8 F (36.6 C)   TempSrc: Oral Oral Oral Oral   SpO2: 96% 96% 99% 100%   Weight:       Height:         Temp:  [97.5 F (36.4 C)-98.1 F (36.7 C)] 97.8 F (36.6 C)  Heart Rate:  [60-78] 60  Resp:  [16-17] 17  BP: (108-128)/(54-77) 118/70    Physical Exam   Appearance:   Well groomed, well nourished patient in no acute distress.  Language/Speech:   Fluent language.  No word finding difficulty or paraphasic  errors.  Able to name simple objects. Able to follow 2 step commands.  No neglect or gaze preference.  Attention/concentration:   Awake and able to attend to the examiner.  Able to provide a history with memory intact to recent events.  Orientation:   Oriented to self, year, month, day, date, situation.      CN II Pupils are equal, round and reactive.  No afferent pupillary defect.      CN III,IV,VI Eyes are aligned in primary gaze.  No skew.  Extraocular eye movements intact, full vertical and horizontal duction.No nystagmus.    CN V Sensation is intact V1-V3 bilaterally to light touch.    CN VII Facial movements are symmetric at rest and with activation.  No dysarthria.    CN VIII Hearing is intact to conversational tone.    CN IX & X Soft palate elevates symmetrically.    CN XI Shoulder shrug strength 5/5 bilaterally .    CN XII Tongue protrudes midline.     Motor:   Normal tone and bulk  No tremors and myoclonus  Moves all extremities antigravity without apparent difficulty    Sensation:  light touch intact bilaterally upper and lower extremities    Coordination:  no dysmetria or tremor noted    Labs     Lab Results   Component Value Date    GLUCOSE 81 10/23/2022    BUN 12 10/23/2022    CO2 30 10/23/2022    CREATININE 0.87 10/23/2022    K 3.9 10/23/2022    NA 140 10/23/2022    CL 106 10/23/2022    CALCIUM 9.0 10/23/2022       Lab Results   Component Value Date    WBC 5.7 10/24/2022    HGB 12.6 (L) 10/24/2022    HCT 36.4 (L) 10/24/2022    MCV 85.1 10/24/2022    PLT 230 10/24/2022     No results found for: PTT, INR  Lab Results   Component Value Date    CHOLTOT 164 09/22/2022    TRIG 81 09/22/2022    HDL 46 (L) 09/22/2022    LDL 102 09/22/2022     Lab Results   Component Value Date    HGBA1C 5.1 10/22/2022       Lab Results   Component Value Date    COLORU Yellow 10/21/2022    CLARITYU Clear 10/21/2022    PROTEINUA Negative 10/21/2022    PHUR 6.0 10/21/2022    LABSPEC 1.028 10/21/2022    GLUCOSEU Negative  10/21/2022    BLOODU Negative 10/21/2022    LEUKOCYTESUR Negative 10/21/2022    NITRITE Negative 10/21/2022    BILIRUBINUR Negative 10/21/2022    UROBILINOGEN <2.0 10/21/2022       Lab Results   Component Value Date    METHADSCRNUR Negative 10/21/2022    BARBSCRNUR Negative 10/21/2022    BENZOSCRNUR Negative 10/21/2022    OPIATESCRNUR Presumptive Positive (A) 10/21/2022    THCSCRNUR Negative 10/21/2022    COCAINSCRNUR Negative 10/21/2022       Lab Results   Component Value Date    CKTOTAL 92 09/21/2022     Imaging and Diagnostic Studies     CT head results:  CT Head WO contrast    Result Date: 09/21/2022  IMPRESSION: 1.  No acute intracranial abnormality. 2.  No intracranial mass effect or hemorrhage. Approved by Estanislado Pandy, MD on 09/21/2022 4:50 AM EDT I have personally reviewed the images and I agree with this report. Report Verified  by: Letitia Caul, MD at 09/21/2022 5:06 AM EDT       I spent 35 minutes, providing care, interpreting results, documenting, and coordinating the patient's care. I personally reviewed the patient's previous medical records, current laboratory and images, and spent >50% of total time counseling the patient and/or family on their diagnosis, prognosis, medication regimen, other management and answering questions.    I contacted for ancillary history to aid in provision of the patient's care.  I contacted to discuss (interpretation of test results, coordination of care).    Portions of this note may have been copied forward with edits on 10/24/2022.  I have reviewed and updated the history, physical exam, data, assessment, and plan of the note so that it reflects my evaluation and management of the patient.     Bertram Denver, CNP  [facility]  10/24/2022

## 2022-10-24 NOTE — Plan of Care (Signed)
Problem: Safety  Goal: Patient will be injury free during hospitalization  Description: Assess and monitor vitals signs, neurological status including level of consciousness and orientation. Assess patient's risk for falls and implement fall prevention plan of care and interventions per hospital policy.      Ensure arm band on, uncluttered walking paths in room, adequate room lighting, call light and overbed table within reach, bed in low position, wheels locked, side rails up per policy, and non-skid footwear provided.   Outcome: Progressing     Problem: Patient will remain free of falls  Goal: Universal Fall Precautions  Outcome: Progressing     Problem: Psychosocial Needs  Goal: Demonstrates ability to cope with hospitalization/illness  Description: Assess and monitor patients ability to cope with his/her illness.  Outcome: Progressing

## 2022-10-24 NOTE — Progress Notes (Signed)
Patient name: David Jordan  Mrn: [id number]  Dob: 21-Jul-1985    Religious Preference: None    Patient Objectives: Spiritual Care Assessment    Have Patient Objectives been achieved: Yes    Assessment: Kiril Carpio was seen by Lolly Mustache M to introduce spiritual care and offer support.    10/23/2022 1900   Volunteer Visit    Care Provided: Prayer/ blessing  Time spent (minutes): 5 minutes      [facility] Amela Handley  Staff Chaplain  [phone]  10/24/2022 8:16 AM     Chaplain available on-site 8am-4:30pm M-F at [phone]. For support outside of those hours, please contact on-call chaplain on [employer].

## 2022-10-25 ENCOUNTER — Inpatient Hospital Stay: Admit: 2022-10-25 | Payer: PRIVATE HEALTH INSURANCE

## 2022-10-25 DIAGNOSIS — G43109 Migraine with aura, not intractable, without status migrainosus: Principal | ICD-10-CM

## 2022-10-25 LAB — POC GLU MONITORING DEVICE
POC Glucose Monitoring Device: 111 mg/dL — ABNORMAL HIGH (ref 70–100)
POC Glucose Monitoring Device: 83 mg/dL (ref 70–100)
POC Glucose Monitoring Device: 96 mg/dL (ref 70–100)

## 2022-10-25 MED ORDER — phenylephrine (NEO-SYNEPHRINE) injection
10 | INTRAMUSCULAR | PRN
Start: 2022-10-25 — End: 2022-10-25
  Administered 2022-10-25 (×3): via INTRAVENOUS

## 2022-10-25 MED ORDER — ondansetron (ZOFRAN) injection
4 | INTRAMUSCULAR | PRN
Start: 2022-10-25 — End: 2022-10-25
  Administered 2022-10-25: 13:00:00 via INTRAVENOUS

## 2022-10-25 MED ORDER — divalproex (DEPAKOTE) 500 MG DR tablet
500 | ORAL_TABLET | Freq: Two times a day (BID) | ORAL | 0 refills | Status: AC
Start: 2022-10-25 — End: 2022-11-27

## 2022-10-25 MED ORDER — sodium chloride 0.9 % IV infusion
INTRAVENOUS | PRN
Start: 2022-10-25 — End: 2022-10-25
  Administered 2022-10-25: 13:00:00 via INTRAVENOUS

## 2022-10-25 MED ORDER — propofol 10 mg/ml (DIPRIVAN) 10 mg/mL injection
10 | INTRAVENOUS | Status: AC
Start: 2022-10-25 — End: ?

## 2022-10-25 MED ORDER — propofol 10 mg/ml (DIPRIVAN) injection
10 | INTRAVENOUS | PRN
Start: 2022-10-25 — End: 2022-10-25
  Administered 2022-10-25: 13:00:00 via INTRAVENOUS

## 2022-10-25 MED ORDER — dexamethasone (DECADRON) 4 mg/mL injection
4 | INTRAMUSCULAR | Status: AC
Start: 2022-10-25 — End: ?

## 2022-10-25 MED ORDER — lidocaine (PF) 2% (20 mg/mL) Soln
20 | INTRAMUSCULAR | PRN
Start: 2022-10-25 — End: 2022-10-25
  Administered 2022-10-25: 13:00:00 via INTRAVENOUS

## 2022-10-25 MED ORDER — cyanocobalamin (VITAMIN B-12) 1000 MCG tablet
1000 | ORAL_TABLET | Freq: Every day | ORAL | 0 refills | Status: AC
Start: 2022-10-25 — End: ?

## 2022-10-25 MED ORDER — midazolam (PF) (VERSED) injection
1 | INTRAMUSCULAR | PRN
Start: 2022-10-25 — End: 2022-10-25
  Administered 2022-10-25 (×2): via INTRAVENOUS

## 2022-10-25 MED ORDER — dexamethasone (DECADRON) injection
4 | INTRAMUSCULAR | PRN
Start: 2022-10-25 — End: 2022-10-25
  Administered 2022-10-25: 13:00:00 via INTRAVENOUS

## 2022-10-25 MED ORDER — famotidine (PF) (PEPCID) 20 mg/2 mL injection
20 | INTRAVENOUS | Status: AC
Start: 2022-10-25 — End: ?

## 2022-10-25 MED ORDER — lidocaine (PF) 2% (20 mg/mL) 20 mg/mL (2 %) Soln
20 | INTRAMUSCULAR | Status: AC
Start: 2022-10-25 — End: ?

## 2022-10-25 MED ORDER — midazolam (PF) (VERSED) 1 mg/mL injection
1 | INTRAMUSCULAR | Status: AC
Start: 2022-10-25 — End: ?

## 2022-10-25 MED ORDER — ondansetron (ZOFRAN) 4 mg/2 mL injection
4 | INTRAMUSCULAR | Status: AC
Start: 2022-10-25 — End: ?

## 2022-10-25 MED ORDER — gadobutrol (GADAVIST) 1 mmol/1 mL IV solution 6.5 mL
1 | Freq: Once | INTRAVENOUS | Status: AC | PRN
Start: 2022-10-25 — End: 2022-10-25
  Administered 2022-10-25: 16:00:00 via INTRAVENOUS

## 2022-10-25 MED ORDER — famotidine (PF) (PEPCID) injection
20 | INTRAVENOUS | PRN
Start: 2022-10-25 — End: 2022-10-25
  Administered 2022-10-25: 13:00:00 via INTRAVENOUS

## 2022-10-25 MED FILL — ONDANSETRON HCL (PF) 4 MG/2 ML INJECTION SOLUTION: 4 4 mg/2 mL | INTRAMUSCULAR | Qty: 2

## 2022-10-25 MED FILL — METOCLOPRAMIDE 5 MG/ML INJECTION SOLUTION: 5 5 mg/mL | INTRAMUSCULAR | Qty: 2

## 2022-10-25 MED FILL — VALPROATE SODIUM 500 MG/5 ML (100 MG/ML) INTRAVENOUS SOLUTION: 500 500 mg/5 mL (100 mg/mL) | INTRAVENOUS | Qty: 2.5

## 2022-10-25 MED FILL — SODIUM CHLORIDE 0.9 % INTRAVENOUS SOLUTION: 50.0000 50.0000 mL/hr | INTRAVENOUS | Qty: 1000

## 2022-10-25 MED FILL — HEPARIN (PORCINE) 5,000 UNIT/ML INJECTION SOLUTION: 5000 5,000 unit/mL | INTRAMUSCULAR | Qty: 1

## 2022-10-25 MED FILL — PROPOFOL 10 MG/ML INTRAVENOUS EMULSION: 10 10 mg/mL | INTRAVENOUS | Qty: 20

## 2022-10-25 MED FILL — DEXAMETHASONE SODIUM PHOSPHATE 4 MG/ML INJECTION SOLUTION: 4 4 mg/mL | INTRAMUSCULAR | Qty: 5

## 2022-10-25 MED FILL — KETOROLAC 15 MG/ML INJECTION SOLUTION: 15 15 mg/mL | INTRAMUSCULAR | Qty: 2

## 2022-10-25 MED FILL — CYANOCOBALAMIN (VIT B-12) 1,000 MCG/ML INJECTION SOLUTION: 1000 1,000 mcg/mL | INTRAMUSCULAR | Qty: 1

## 2022-10-25 MED FILL — MIDAZOLAM (PF) 1 MG/ML INJECTION SOLUTION: 1 1 mg/mL | INTRAMUSCULAR | Qty: 2

## 2022-10-25 MED FILL — LIDOCAINE (PF) 20 MG/ML (2 %) INJECTION SOLUTION: 20 20 mg/mL (2 %) | INTRAMUSCULAR | Qty: 5

## 2022-10-25 MED FILL — FAMOTIDINE (PF) 20 MG/2 ML INTRAVENOUS SOLUTION: 20 20 mg/2 mL | INTRAVENOUS | Qty: 2

## 2022-10-25 NOTE — Plan of Care (Signed)
Problem: Safety  Goal: Patient will be injury free during hospitalization  Description: Assess and monitor vitals signs, neurological status including  level of consciousness and orientation. Assess patient's risk for falls and  implement fall prevention plan of care and interventions per hospital policy.    Ensure arm band on, uncluttered walking paths in room, adequate room lighting,  call light and overbed table within reach, bed in low position, wheels locked,  side rails up per policy, and non-skid footwear provided.  Outcome: Progressing    Problem: Patient will remain free of falls  Goal: Universal Fall Precautions  Outcome: Progressing    Problem: Daily Care  Goal: Daily care needs are met  Description: Assess and monitor ability to perform self care and identify  potential discharge needs.  Outcome: Progressing    Problem: Psychosocial Needs  Goal: Demonstrates ability to cope with hospitalization/illness  Description: Assess and monitor patients ability to cope with his/her illness.  Outcome: Progressing

## 2022-10-25 NOTE — Discharge Summary (Signed)
[facility]  Discharge Summary      PATIENT INFORMATION   Patient's PCP:  Venita Sheffield, PA  Name: David Jordan  DOB: 1986/03/09  MRN: [id number]     Admit Date:  10/21/2022    Discharge Date:  10/25/2022    Patient Class:  Inpatient    ADMITTING / DISCHARGING PHYSICIAN   Admitting Physician:  Shari Heritage, DO    Discharge Physician:  Mardene Speak [facility]   Pending Tests at Discharge  none    Incidental Findings Requiring Follow Up  none      Consulting Physicians:  neurology      Brief Hospital Course:   Patient was admitted to the hospital and the following problems were managed:  Migraine headaches with aura  Transient altered mental status  Abdominal pain      37 year old male who presents with altered mental status memory loss bilateral extremity numbness and weakness he did not actually pass out.  He was a hospitalized in June for similar symptoms.  He underwent EEG again without focal neurologic seizures or epileptiform discharges.  Neurology was consulted as B12 is slightly low and was started on IM injections he received 4 while hospitalized and will complete therapy with oral B12 at discharge.  MRI was performed general anesthesia which did not show any abnormalities.  He will discharge home and will have an outpatient lumbar puncture performed under the guidance of neurology.  Be following up with PCP and neurology at discharge.  Will start 500 mg twice daily Depakote for headaches per neurology recommendations      PHYSICAL EXAM   Physical exam on day of hospital discharge:     BP 116/78 (BP Location: Left upper arm, Patient Position: Lying, BP Cuff Size: Large)   Pulse 68   Temp 97.4 F (36.3 C) (Oral)   Resp 14   Ht 5' 9 (1.753 m)   Wt 141 lb (64 kg)   SpO2 100%   BMI 20.82 kg/m     General Appearance:  Alert, cooperative, no distress, appears stated age    Head:  Normocephalic, without obvious abnormality, atraumatic    Lungs:  Clear to auscultation  bilaterally, respirations unlabored    Heart:  Regular rate and rhythm, S1 and S2 normal   Abdomen:  Soft, non-tender,    Extremities:  Extremities normal, no edema   Skin:  Warm and dry   Neurologic:  Face symmetric, speech fluent, no tremor          LABS   Labs prior to hospital discharge:  Recent Labs     10/23/22  0559 10/24/22  0637   WBC 4.6 5.7   HGB 13.1* 12.6*   HCT 38.1* 36.4*   PLT 227 230                                                                  Recent Labs     10/23/22  0559   NA 140   K 3.9   CL 106   CO2 30   BUN 12   CREATININE 0.87   GLUCOSE 81       SURGERIES       LINES / DRAINS / AIRWAYS  Patient Lines/Drains/Airways Status       Active Line / PIV Line       Name Placement date Placement time Site Days    Peripheral IV 10/21/22 Right Antecubital 10/21/22  1647  Antecubital  3                    IMAGING STUDIES   MRI Head W WO contrast    Result Date: 10/25/2022  EXAM: MRI BRAIN WO AND W CONTRAST EXAM: MRV HEAD WO AND W CONTRAST EXAM: MRI CERVICAL SPINE WO AND W CONTRAST EXAM: MRI THORACIC SPINE WO AND W CONTRAST INDICATION: Headache, sudden, severe TECHNIQUE: MRI of the brain, cervical spine, and thoracic spine was performed including multiplanar T1 and T2 weighted sequences. Imaging was obtained prior to and after  6.5 mL of GADOBUTROL 1 MMOL/ML INTRAVENOUS SOLUTION [facility]) administered intravenously . Intracranial MR venography was performed before and after the administration of contrast. Sagittal and coronal reconstructed images were performed on the scanner COMPARISON: Head CT dated 09/21/2022 FINDINGS: Adequate diagnostic quality. Brain: Brain parenchyma: No diffusion restriction. Normal parenchymal signal and volume. Mild increased signal within the cerebral sulci on the FLAIR axial sequences likely related to hyperoxygenation secondary to sedation and intubation. No abnormal intraaxial enhancement. No hemorrhagic staining. Normal pituitary gland, optic chiasm, and cerebellar  tonsils. Ventricles and extraaxial spaces: Normal ventricular size and position. No extra-axial fluid collection. Marrow signal of calvarium and skull base: Normal. Orbits, paranasal sinuses, and mastoid regions: No acute orbital abnormality. Clear paranasal sinuses. Aerated mastoid air cells. Vascular structures: Normal arterial flow voids. Intracranial MRV: The superior sagittal sinus is patent. Bilateral codominant transverse and sigmoid sinuses are patent. Deep venous structures are patent. No flow significant stenosis or luminal thrombosis is seen. Cervical and thoracic spines: Alignment: Straightening of the normal cervical lordosis with minimal anterolisthesis at C3-4. The thoracic vertebral alignment is intact. Osseous structures: Vertebral body heights are maintained. 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 No concerning marrow signal abnormality is identified.  Spinal cord: The cervical and thoracic spinal cord is normal in size and signal characteristics. No abnormal enhancement is seen. The tip of the conus is not included on this examination. Individual disc space levels: C2-3: Unremarkable. C3-4, C4-5: Minimal noncompressive disc bulging is present. C5-6: Mild disc bulging and minimal hypertrophic changes partially efface the anterior subarachnoid space without cord compression. The C6 foramina are patent. C6-7, C7-T1: Unremarkable. The thoracic disc spaces are unremarkable. No disc herniations, spinal stenosis or cord compression are identified. The neural foramina are patent. Extraspinal structures: No paraspinal soft tissue masses.     IMPRESSION: Brain 1.  Normal MRI brain without and with contrast. MRV 1. Normal. No venous sinus thrombosis or stenosis. Cervical spine 1.  No cord lesion or abnormal enhancement. 2.  Mild degenerative changes at C3-C6 without stenosis or neural compression. Thoracic spine 1.  Negative. No cord lesion or abnormal enhancement. Report  Verified by: Larey Dresser, MD at 10/25/2022 1:17 PM EDT    MRI Angio Head W and WO contrast    Result Date: 10/25/2022  EXAM: MRI BRAIN WO AND W CONTRAST EXAM: MRV HEAD WO AND W CONTRAST EXAM: MRI CERVICAL SPINE WO AND W CONTRAST EXAM: MRI THORACIC SPINE WO AND W CONTRAST INDICATION: Headache, sudden, severe TECHNIQUE: MRI of the brain, cervical spine, and thoracic spine was performed including multiplanar T1 and T2 weighted sequences. Imaging was obtained prior to and after  6.5 mL of GADOBUTROL  1 MMOL/ML INTRAVENOUS SOLUTION [facility]) administered intravenously . Intracranial MR venography was performed before and after the administration of contrast. Sagittal and coronal reconstructed images were performed on the scanner COMPARISON: Head CT dated 09/21/2022 FINDINGS: Adequate diagnostic quality. Brain: Brain parenchyma: No diffusion restriction. Normal parenchymal signal and volume. Mild increased signal within the cerebral sulci on the FLAIR axial sequences likely related to hyperoxygenation secondary to sedation and intubation. No abnormal intraaxial enhancement. No hemorrhagic staining. Normal pituitary gland, optic chiasm, and cerebellar tonsils. Ventricles and extraaxial spaces: Normal ventricular size and position. No extra-axial fluid collection. Marrow signal of calvarium and skull base: Normal. Orbits, paranasal sinuses, and mastoid regions: No acute orbital abnormality. Clear paranasal sinuses. Aerated mastoid air cells. Vascular structures: Normal arterial flow voids. Intracranial MRV: The superior sagittal sinus is patent. Bilateral codominant transverse and sigmoid sinuses are patent. Deep venous structures are patent. No flow significant stenosis or luminal thrombosis is seen. Cervical and thoracic spines: Alignment: Straightening of the normal cervical lordosis with minimal anterolisthesis at C3-4. The thoracic vertebral alignment is intact. Osseous structures: Vertebral body heights are  maintained. 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 No concerning marrow signal abnormality is identified.  Spinal cord: The cervical and thoracic spinal cord is normal in size and signal characteristics. No abnormal enhancement is seen. The tip of the conus is not included on this examination. Individual disc space levels: C2-3: Unremarkable. C3-4, C4-5: Minimal noncompressive disc bulging is present. C5-6: Mild disc bulging and minimal hypertrophic changes partially efface the anterior subarachnoid space without cord compression. The C6 foramina are patent. C6-7, C7-T1: Unremarkable. The thoracic disc spaces are unremarkable. No disc herniations, spinal stenosis or cord compression are identified. The neural foramina are patent. Extraspinal structures: No paraspinal soft tissue masses.     IMPRESSION: Brain 1.  Normal MRI brain without and with contrast. MRV 1. Normal. No venous sinus thrombosis or stenosis. Cervical spine 1.  No cord lesion or abnormal enhancement. 2.  Mild degenerative changes at C3-C6 without stenosis or neural compression. Thoracic spine 1.  Negative. No cord lesion or abnormal enhancement. Report Verified by: Larey Dresser, MD at 10/25/2022 1:17 PM EDT    MRI Cervical spine W WO contrast    Result Date: 10/25/2022  EXAM: MRI BRAIN WO AND W CONTRAST EXAM: MRV HEAD WO AND W CONTRAST EXAM: MRI CERVICAL SPINE WO AND W CONTRAST EXAM: MRI THORACIC SPINE WO AND W CONTRAST INDICATION: Headache, sudden, severe TECHNIQUE: MRI of the brain, cervical spine, and thoracic spine was performed including multiplanar T1 and T2 weighted sequences. Imaging was obtained prior to and after  6.5 mL of GADOBUTROL 1 MMOL/ML INTRAVENOUS SOLUTION [facility]) administered intravenously . Intracranial MR venography was performed before and after the administration of contrast. Sagittal and coronal reconstructed images were performed on the scanner COMPARISON: Head CT dated 09/21/2022 FINDINGS:  Adequate diagnostic quality. Brain: Brain parenchyma: No diffusion restriction. Normal parenchymal signal and volume. Mild increased signal within the cerebral sulci on the FLAIR axial sequences likely related to hyperoxygenation secondary to sedation and intubation. No abnormal intraaxial enhancement. No hemorrhagic staining. Normal pituitary gland, optic chiasm, and cerebellar tonsils. Ventricles and extraaxial spaces: Normal ventricular size and position. No extra-axial fluid collection. Marrow signal of calvarium and skull base: Normal. Orbits, paranasal sinuses, and mastoid regions: No acute orbital abnormality. Clear paranasal sinuses. Aerated mastoid air cells. Vascular structures: Normal arterial flow voids. Intracranial MRV: The superior sagittal sinus is patent. Bilateral codominant transverse and sigmoid  sinuses are patent. Deep venous structures are patent. No flow significant stenosis or luminal thrombosis is seen. Cervical and thoracic spines: Alignment: Straightening of the normal cervical lordosis with minimal anterolisthesis at C3-4. The thoracic vertebral alignment is intact. Osseous structures: Vertebral body heights are maintained. 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 No concerning marrow signal abnormality is identified.  Spinal cord: The cervical and thoracic spinal cord is normal in size and signal characteristics. No abnormal enhancement is seen. The tip of the conus is not included on this examination. Individual disc space levels: C2-3: Unremarkable. C3-4, C4-5: Minimal noncompressive disc bulging is present. C5-6: Mild disc bulging and minimal hypertrophic changes partially efface the anterior subarachnoid space without cord compression. The C6 foramina are patent. C6-7, C7-T1: Unremarkable. The thoracic disc spaces are unremarkable. No disc herniations, spinal stenosis or cord compression are identified. The neural foramina are patent. Extraspinal  structures: No paraspinal soft tissue masses.     IMPRESSION: Brain 1.  Normal MRI brain without and with contrast. MRV 1. Normal. No venous sinus thrombosis or stenosis. Cervical spine 1.  No cord lesion or abnormal enhancement. 2.  Mild degenerative changes at C3-C6 without stenosis or neural compression. Thoracic spine 1.  Negative. No cord lesion or abnormal enhancement. Report Verified by: Larey Dresser, MD at 10/25/2022 1:17 PM EDT    MRI Thoracic spine W WO contrast    Result Date: 10/25/2022  EXAM: MRI BRAIN WO AND W CONTRAST EXAM: MRV HEAD WO AND W CONTRAST EXAM: MRI CERVICAL SPINE WO AND W CONTRAST EXAM: MRI THORACIC SPINE WO AND W CONTRAST INDICATION: Headache, sudden, severe TECHNIQUE: MRI of the brain, cervical spine, and thoracic spine was performed including multiplanar T1 and T2 weighted sequences. Imaging was obtained prior to and after  6.5 mL of GADOBUTROL 1 MMOL/ML INTRAVENOUS SOLUTION [facility]) administered intravenously . Intracranial MR venography was performed before and after the administration of contrast. Sagittal and coronal reconstructed images were performed on the scanner COMPARISON: Head CT dated 09/21/2022 FINDINGS: Adequate diagnostic quality. Brain: Brain parenchyma: No diffusion restriction. Normal parenchymal signal and volume. Mild increased signal within the cerebral sulci on the FLAIR axial sequences likely related to hyperoxygenation secondary to sedation and intubation. No abnormal intraaxial enhancement. No hemorrhagic staining. Normal pituitary gland, optic chiasm, and cerebellar tonsils. Ventricles and extraaxial spaces: Normal ventricular size and position. No extra-axial fluid collection. Marrow signal of calvarium and skull base: Normal. Orbits, paranasal sinuses, and mastoid regions: No acute orbital abnormality. Clear paranasal sinuses. Aerated mastoid air cells. Vascular structures: Normal arterial flow voids. Intracranial MRV: The superior sagittal sinus is patent.  Bilateral codominant transverse and sigmoid sinuses are patent. Deep venous structures are patent. No flow significant stenosis or luminal thrombosis is seen. Cervical and thoracic spines: Alignment: Straightening of the normal cervical lordosis with minimal anterolisthesis at C3-4. The thoracic vertebral alignment is intact. Osseous structures: Vertebral body heights are maintained. 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 No concerning marrow signal abnormality is identified.  Spinal cord: The cervical and thoracic spinal cord is normal in size and signal characteristics. No abnormal enhancement is seen. The tip of the conus is not included on this examination. Individual disc space levels: C2-3: Unremarkable. C3-4, C4-5: Minimal noncompressive disc bulging is present. C5-6: Mild disc bulging and minimal hypertrophic changes partially efface the anterior subarachnoid space without cord compression. The C6 foramina are patent. C6-7, C7-T1: Unremarkable. The thoracic disc spaces are unremarkable. No disc  herniations, spinal stenosis or cord compression are identified. The neural foramina are patent. Extraspinal structures: No paraspinal soft tissue masses.     IMPRESSION: Brain 1.  Normal MRI brain without and with contrast. MRV 1. Normal. No venous sinus thrombosis or stenosis. Cervical spine 1.  No cord lesion or abnormal enhancement. 2.  Mild degenerative changes at C3-C6 without stenosis or neural compression. Thoracic spine 1.  Negative. No cord lesion or abnormal enhancement. Report Verified by: Larey Dresser, MD at 10/25/2022 1:17 PM EDT    CT Abdomen and Pelvis With IV contrast    Result Date: 10/21/2022  EXAM: CT ABDOMEN AND PELVIS WITH IV CONTRAST INDICATION: Abdominal pain, acute, nonlocalized TECHNIQUE: CT of the abdomen and pelvis was performed after the administration of intravenous contrast. Axial images were obtained with coronal and sagittal reconstructions. CONTRAST:  125 mL of IOHEXOL 350 MG IODINE/ML INTRAVENOUS SOLUTION administered intravenously FIELD OF VIEW: 36 cm COMPARISON: None available. FINDINGS: Lower chest: Unremarkable. Liver: Normal morphology. Segment 4 lesion measuring 2.3 x 2.1 cm, likely benign cyst. Additional scattered subcentimeter hypoattenuating hepatic lesions are too small to characterize, may represent additional cysts or hemangiomas. Biliary tree: Unremarkable. Spleen: Nonenlarged. Low-density lesions in the spleen measuring up to 1.9 cm likely benign cysts. Pancreas: Suspected pancreas divisum. Otherwise unremarkable. Adrenal glands: Unremarkable. Kidneys/Ureters/Bladder: Few subcentimeter low-density lesions within the left kidney are too small to characterize but likely represent cysts. No hydronephrosis. No radiopaque renal or ureteral calculi. Urinary bladder is underdistended limiting evaluation. Gastrointestinal tract: Unremarkable. Lymphatics: Unremarkable. Vasculature: Unremarkable. Peritoneum/Retroperitoneum: Unremarkable. Abdominal wall/soft tissues: Unremarkable. Genital Organs: Unremarkable Osseous structures: No acute osseous abnormality.     IMPRESSION: No acute abnormality in the abdomen and pelvis. Approved by Estanislado Pandy, MD on 10/21/2022 7:19 PM EDT I have personally reviewed the images and I agree with this report. Report Verified by: Micheal Likens, DO at 10/21/2022 7:26 PM EDT       OTHER PROCEDURES       ALLERGIES   Codeine  Shellfish Derived    DISCHARGE MEDICATIONS     Current Discharge Medication List        START taking these medications    Details   cyanocobalamin (VITAMIN B-12) 1000 MCG tablet Take 1 tablet (1,000 mcg total) by mouth daily.  Qty: 30 tablet, Refills: 0      divalproex (DEPAKOTE) 500 MG DR tablet Take 1 tablet (500 mg total) by mouth 2 times a day.  Qty: 90 tablet, Refills: 0           CONTINUE these medications which have NOT CHANGED    Details   ibuprofen (MOTRIN) 200 MG tablet Take 1 tablet (200 mg  total) by mouth every 6 hours as needed for Pain.      atogepant (QULIPTA) 60 mg Tab Take 1 tablet (60 mg total) by mouth daily.  Qty: 30 tablet, Refills: 5    Associated Diagnoses: Intractable chronic migraine with aura and without status migrainosus      blood sugar diagnostic (GLUCOSE BLOOD) Strp Use to test blood sugar 1 times a day. Dx: hypoglycemia . Brand per pharmacy / insurance preference.  Qty: 100 strip, Refills: 3      blood-glucose meter (GLUCOSE MONITORING KIT) kit Use to test blood sugar 1 times a day. Dx: hypoglycemia . Brand per pharmacy / insurance preference.  Qty: 1 kit, Refills: 0      !! lancets Misc Use to test blood sugar 1 times a day. Dx: hypoglycemia . Brand  per pharmacy / insurance preference.  Qty: 100 each, Refills: 3      !! lancets Misc Use to test blood sugar 1 times a day. Dx: hypoglycemia . Brand per pharmacy / insurance preference.  Qty: 120 each, Refills: 11       !! - Potential duplicate medications found. Please discuss with provider.        STOP taking these medications       methylPREDNISolone (MEDROL, PAK,) 4 mg tablet Comments:   Reason for Stopping:               DISPOSITION   home    DISCHARGE INSTRUCTIONS   Activity: activity as tolerated  Diet: regular diet    FOLLOW-UP   Call and schedule an appointment with your Primary Care Physician Venita Sheffield, PA within 1 week of discharge.  No follow-up provider specified.    Future Appointments   Date Time Provider Department Center   01/16/2023  3:30 PM Galvin Proffer, MD [facility] NEUR [place] [facility]       Total time spent on this discharge was 34 minutes including time spent with the patient, reviewing data, completing medical records, and communication with the patient.    Thank you for the opportunity to be involved in your patient's care.   Electronically signed,  Mardene Speak DO  10/25/2022  2:32 PM  [facility]

## 2022-10-25 NOTE — TOC Discharge Planning (AHS/AVS) (Signed)
Anesthesia Transfer of Care Note    Patient: David Jordan  Procedure(s) Performed: * No procedures listed *    Patient location: PACU    Anesthesia type: No value filed.    Airway Device on Arrival to PACU/ICU: Nasal Cannula    IV Access: Peripheral    Monitors Recommended to be Used During PACU/ICU: Standard Monitors    Outstanding Issues to Address: None    Level of Consciousness: lethargic and responds to stimulation    Post vital signs:    Vitals:    10/25/22 1150   BP: 117/75   Pulse: 95   Resp: 16   Temp: 97.6 F (36.4 C)   SpO2: 100%       Complications:  No notable events documented.    Date 10/24/22 0700 - 10/25/22 0659 10/25/22 0700 - 10/26/22 0659   Shift 0700-1459 1500-2259 2300-0659 24 Hour Total 0700-1459 1500-2259 2300-0659 24 Hour Total   INTAKE   P.O.   0 0         P.O.   0 0       I.V.(mL/kg)     500(7.8)   500(7.8)     Volume (mL) (sodium chloride 0.9 % IV infusion)     500   500   Shift Total(mL/kg)   0(0) 0(0) 500(7.8)   500(7.8)   OUTPUT   Urine(mL/kg/hr)             Urine Occurrence   0 x 0 x 1 x   1 x   Emesis/NG output   0 0         Emesis   0 0         Emesis Occurrence   0 x 0 x       Stool             Stool Occurrence  0 x 0 x 0 x       Blood   0 0         Est Blood Loss   0 0         Blood   0 0       Shift Total(mL/kg)   0(0) 0(0)       Weight (kg) 61.2 61.2 64.2 64.2 64 64 64 64

## 2022-10-25 NOTE — Anesthesia Post-Procedure Evaluation (Signed)
Anesthesia Post Note    Patient: David Jordan    Procedure(s) Performed: * No procedures listed *    Anesthesia type: general    Patient location: PACU    Airway: Patent    Post pain: Adequate analgesia    Nausea / Vomiting: Absent    Post-operative Hydration Status: Adequate    Post assessment: no apparent anesthetic complications    Last Vitals:   Vitals:    10/25/22 1155 10/25/22 1200 10/25/22 1205 10/25/22 1244   BP: 119/87 116/86 112/76 116/78   BP Location:    Left upper arm   Patient Position:    Lying   BP Cuff Size:    Large   Pulse: 75 77 75 68   Resp: 14 16 13 14    Temp:   97.3 F (36.3 C) 97.4 F (36.3 C)   TempSrc:   Temporal Oral   SpO2: 100% 100% 100%    Weight:       Height:            Last Temperature: No data recorded    Post vital signs: stable    Level of consciousness: awake    Complications:  No notable events documented.

## 2022-10-25 NOTE — TOC Discharge Planning (AHS/AVS) (Signed)
Anesthesia Transfer of Care Note    Patient: David Jordan  Procedure(s) Performed: * No procedures listed *    Patient location: PACU    Anesthesia type: general    Airway Device on Arrival to PACU/ICU: Nasal Cannula    IV Access: Peripheral    Monitors Recommended to be Used During PACU/ICU: Standard Monitors    Outstanding Issues to Address: None    Level of Consciousness: lethargic and obtunded    Post vital signs:    Vitals:    10/25/22 1150   BP: 117/75   Pulse: 95   Resp: 16   Temp: 97.6 F (36.4 C)   SpO2: 100%       Complications:  No notable events documented.    Date 10/24/22 0700 - 10/25/22 0659 10/25/22 0700 - 10/26/22 0659   Shift 0700-1459 1500-2259 2300-0659 24 Hour Total 0700-1459 1500-2259 2300-0659 24 Hour Total   INTAKE   P.O.   0 0         P.O.   0 0       I.V.(mL/kg)     500(7.8)   500(7.8)     Volume (mL) (sodium chloride 0.9 % IV infusion)     500   500   Shift Total(mL/kg)   0(0) 0(0) 500(7.8)   500(7.8)   OUTPUT   Urine(mL/kg/hr)             Urine Occurrence   0 x 0 x 1 x   1 x   Emesis/NG output   0 0         Emesis   0 0         Emesis Occurrence   0 x 0 x       Stool             Stool Occurrence  0 x 0 x 0 x       Blood   0 0         Est Blood Loss   0 0         Blood   0 0       Shift Total(mL/kg)   0(0) 0(0)       Weight (kg) 61.2 61.2 64.2 64.2 64 64 64 64

## 2022-10-25 NOTE — Other (Signed)
Anesthesia Extubation Criteria:    Airway Device: laryngeal mask airway    Emergence Details:      Smooth      _x_      Stormy       __       Prolonged   __     Extubation Criteria:      Motor strength intact       _x_      Follows commands        _x_      Good airway reflexes      _x_      OP suctioned                  _x_        Follows commands:  Yes     Patient extubated:  Yes

## 2022-10-25 NOTE — Anesthesia Pre-Procedure Evaluation (Signed)
[place]  DEPARTMENT OF ANESTHESIOLOGY  PRE-PROCEDURAL EVALUATION    David Jordan is a 37 y.o. year old male presenting for:    * No procedures listed *    Surgeon:   * No surgeons listed *    Chief Complaint     Episode of loss of consciousness    Review of Systems     Anesthesia Evaluation    Patient summary reviewed.       No history of anesthetic complications   I have reviewed the History and Physical Exam, any relevant changes are noted in the anesthesia pre-operative evaluation.      Cardiovascular:        Neuro/Muscoloskeletal/Psych:    (+) headaches.       Comments: Memory loss  Extremity weakness    Pulmonary:        GI/Hepatic/Renal:            (-) liver disease, renal disease.    Endo/Other:    (+) anemia.      (-) no thrombocytopenia.          Past Medical History     Past Medical History:   Diagnosis Date    Allergy     Seasonal    Bilateral arm weakness     Headache     Memory loss     Migraines June 2024       Past Surgical History     Past Surgical History:   Procedure Laterality Date    PR EEG PHYS/QHP 2-12 HR WITH VEEG  09/22/2022    WISDOM TOOTH EXTRACTION         Family History     Family History   Problem Relation Age of Onset    Diabetes Father     Heart disease Father     Cancer Neg Hx     Kidney disease Neg Hx     Stroke Neg Hx        Social History     Social History     Socioeconomic History    Marital status: Married     Spouse name: Not on file    Number of children: Not on file    Years of education: Not on file    Highest education level: Not on file   Occupational History    Not on file   Tobacco Use    Smoking status: Never    Smokeless tobacco: Never   Vaping Use    Vaping status: Never Used   Substance and Sexual Activity    Alcohol use: Not Currently    Drug use: Never    Sexual activity: Yes     Partners: Female     Birth control/protection: I.U.D.   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    Seat Belt Yes     Social Determinants of Health     Financial Resource Strain: Not on file   Food Insecurity: No Food Insecurity (10/21/2022)    Hunger Vital Sign     Worried About Running Out of Food in the Last Year: Never true     Ran Out of Food in the Last Year: Never true   Transportation Needs: No Transportation Needs (10/21/2022)  PRAPARE - Therapist, art (Medical): No     Lack of Transportation (Non-Medical): No   Physical Activity: Not on file   Stress: Not on file   Social Connections: Not on file   Intimate Partner Violence: Not At Risk (10/21/2022)    Humiliation, Afraid, Rape, and Kick questionnaire     Fear of Current or Ex-Partner: No     Emotionally Abused: No     Physically Abused: No     Sexually Abused: No       Medications     Allergies:  Allergies   Allergen Reactions    Codeine Hives and Nausea And Vomiting     Hives and Fever    Shellfish Derived Swelling and Rash       Home Meds:  Prior to Admission medications as of 10/21/22 2312   Medication Sig Taking?   ibuprofen (MOTRIN) 200 MG tablet Take 1 tablet (200 mg total) by mouth every 6 hours as needed for Pain. Yes   methylPREDNISolone (MEDROL, PAK,) 4 mg tablet follow package directions Yes   atogepant (QULIPTA) 60 mg Tab Take 1 tablet (60 mg total) by mouth daily.    blood sugar diagnostic (GLUCOSE BLOOD) Strp Use to test blood sugar 1 times a day. Dx: hypoglycemia . Brand per pharmacy / insurance preference.    blood-glucose meter (GLUCOSE MONITORING KIT) kit Use to test blood sugar 1 times a day. Dx: hypoglycemia . Brand per pharmacy / insurance preference.    lancets Misc Use to test blood sugar 1 times a day. Dx: hypoglycemia . Brand per pharmacy / insurance preference.    lancets Misc Use to test blood sugar 1 times a day. Dx: hypoglycemia . Brand per pharmacy / insurance preference.        Inpatient Meds:  Scheduled:     cyanocobalamin  1,000 mcg Subcutaneous Daily 0900    heparin  5,000 Units Subcutaneous 3 times per day    insulin lispro  0-5 Units Subcutaneous TID AC    ketorolac  30 mg Intravenous Q8H    metoclopramide HCl  10 mg Intravenous Q8H    valproate (DEPACON) 250 mg in sodium chloride 0.9 % 50 mL IVPB  250 mg Intravenous Q8H     Continuous:    sodium chloride 0.9 % 50 mL/hr (10/25/22 0605)       PRN: acetaminophen, albuterol, dextrose 10% in water **OR** dextrose 10% in water, glucose, ondansetron    Vital Signs     Wt Readings from Last 3 Encounters:   10/25/22 141 lb 8.6 oz (64.2 kg)   10/22/22 140 lb (63.5 kg)   10/19/22 140 lb (63.5 kg)     Ht Readings from Last 3 Encounters:   10/21/22 5' 9 (1.753 m)   10/22/22 5' 9 (1.753 m)   10/16/22 5' 9 (1.753 m)     Temp Readings from Last 3 Encounters:   10/25/22 97.6 F (36.4 C) (Oral)   10/19/22 99.2 F (37.3 C)   09/22/22 98.1 F (36.7 C) (Oral)     BP Readings from Last 3 Encounters:   10/25/22 120/74   10/19/22 129/78   10/16/22 133/81     Pulse Readings from Last 3 Encounters:   10/25/22 80   10/19/22 92   10/16/22 67     SpO2 Readings from Last 3 Encounters:   10/25/22 98%   10/19/22 99%   10/16/22 100%       Physical  Exam     Airway:     Mallampati: II  Mouth Opening: >2 FB  TM distance: > = 3 FB  Neck ROM: full    Dental:   - No obvious cracked, loose, chipped, or missing teeth.     Pulmonary:      Breathing: unlabored         Cardiovascular:     Rhythm: regular  Rate: normal    Neuro/Musculoskeletal/Psych:     Mental status: alert and oriented to person, place and time.          Abdominal:       Current OB Status:       Other Findings:        Laboratory Data     Lab Results   Component Value Date    WBC 5.7 10/24/2022    HGB 12.6 (L) 10/24/2022    HCT 36.4 (L) 10/24/2022    MCV 85.1 10/24/2022    PLT 230 10/24/2022       No results found for: [facility]    Lab Results   Component Value Date    GLUCOSE 81 10/23/2022    BUN 12 10/23/2022    CO2 30 10/23/2022     CREATININE 0.87 10/23/2022    K 3.9 10/23/2022    NA 140 10/23/2022    CL 106 10/23/2022    CALCIUM 9.0 10/23/2022    ALBUMIN 4.7 10/21/2022    PROT 7.4 10/21/2022    ALKPHOS 59 10/21/2022    ALT 21 10/21/2022    AST 15 10/21/2022    BILITOT 1.0 10/21/2022       No results found for: PTT, INR    No results found for: PREGTESTUR, PREGSERUM, HCG, HCGQUANT    Anesthesia Plan     ASA 2         Current non-smoker    Anesthesia Type:  general.      PONV Risk Factors: current non-smoker              Induction:   Intravenous induction.      Anesthetic plan and risks discussed with patient.    Plan, alternatives, and risks of anesthesia, including death, have been explained to and discussed with the patient/legal guardian.  By my assessment, the patient/legal guardian understands and agrees.  Scenario presented in detail.  Questions answered.          Plan discussed with attending.

## 2022-10-25 NOTE — Other (Signed)
[facility]    Case Manager/Social Worker Discharge Summary     Patient name: Flournoy Bayerl                                        Patient MRN: [id number]  DOB: 1985-11-03                              Age: 37 y.o.              Gender: male  Patient emergency contact: Extended Emergency Contact Information  Primary Emergency Contact: Welter,Amanda  Mobile Phone: [phone]  Relation: Spouse  Preferred language: English  Interpreter needed? No      Attending provider: Demetrius Charity, DO  Primary care physician: Venita Sheffield, PA    The MD has indicated that the patient is ready for discharge. AVS reviewed. No Case Management needs noted.     Transfer Mode/Level of Care: Family              The plan has been reviewed: yes    Patient/Family Informed of Discharge Plan: Yes    Plan Reviewed With Patient, Family, or Significant Other: Yes    Patient and or family are aware and in agreement with the discharge plan: Yes             Plan reviewed with MD and other members of the health care team: Yes  Care Plan Completed: Yes    No further CM/SW needs.    This plan has been reviewed with the multi-disciplinary team.              Post-Discharge Goals    Patient's Post-Discharge goals: return home    Post Acute Care Provider Information:           Advertising copywriter at Discharge  Community Services at Home post discharge: Not Applicable

## 2022-10-25 NOTE — Progress Notes (Signed)
PACU:  patient arrived somnolent but quickly became arousable.  He is oriented without complaints.  Report called to [facility].

## 2022-10-25 NOTE — Discharge Instructions (Signed)
Follow up with your PCP and neurologist.  °

## 2022-10-25 NOTE — Nursing Note (Signed)
Pt discharged. IV and tele monitor removed. Discharge paperwork reviewed w/ pt and pt spouse, both verbalized understanding. LP referral form given to pt to scheduled outpt. Pt left w/ all belongings w/ spouse to private residence. Pt left in stable condition.

## 2022-10-26 NOTE — Progress Notes (Signed)
Referring Physician  Bertram Denver    Requested Procedure  Lumbar puncture. Patient states he does not need anesthesia.     Implanted device/Spine Hardware  Wife [name] denies both.     Medications  Motrin 3 day hold prior to procedure. Last dose on 7/26.  Toradol no longer taking.     Patient denied taking any other medications than what were reviewed in this  encounter.    Imaging MRI Head 10/2022    Patient Prepped for a LP on 7/30 @ 2 pm/arrival 1:30 pm.  Agree's to a driver and light food and drink.

## 2022-10-27 ENCOUNTER — Inpatient Hospital Stay: Payer: PRIVATE HEALTH INSURANCE

## 2022-10-27 ENCOUNTER — Ambulatory Visit: Payer: PRIVATE HEALTH INSURANCE

## 2022-10-30 NOTE — Telephone Encounter (Signed)
David Jordan calling to schedule appt for patient who's body is extremely lethargic, but his mind is not. Offered first available appt of 8/21, but would like to him seen sooner. Please advise.

## 2022-10-30 NOTE — Discharge Instructions (Signed)
Lumbar Puncture        Post Procedure Information:     Have a responsible adult drive you home and remain with you for the rest of the day.  After leaving the facility: you may ride home in the vehicle, you may sit up and eat/ drink, you may use the restroom. Otherwise, you should lay flat for the next 24 hours. You can lay on your back or side with no more than 2 pillows. Preferably NOT a recliner. No exertional activity for 24 hours. If a headache develops after 24 hours of bedrest: lay back down for another 24 hours and increase your fluid and caffeine intake.      DO NOT drive, exercise, or perform strenuous activity.  Do NOT take Aspirin, NSAIDS (ibuprofen or Aleve), or other blood thinning medication for 48 hours after the procedure.  Leave bandage in place for 24 hours  You may shower after 24 hours  You may experience back discomfort.  We recommend Tylenol and ice for pain control.           Spinal Headache:    Typically, spinal headaches occur the day after a lumbar puncture.  This headache is postural.  You will notice that when you get out of bed and are upright that you will have a significant headache.  When you lie down, that headache should go away.  This headache will typically not respond to analgesics.           Conservative treatment:    Bedrest:  Lay down flat with head elevated on a maximum of two pillows for an additional 8-10 hours.  Hydration:  Drink one 8 ounce glass of water every hour while awake to prevent dehydration  Analgesics:  Take Tylenol every 4-6 hours for 24 hours or other prescribed pain medicine, as indicated.  Caffeine:  Caffeine intake is a therapy to help shrink the cerebral vessels.  Caffeinated beverages can be somewhat effective.        Problems to report to your doctor:     New back or leg pain that does not go away or increases in intensity.  New numbness or tingling of the legs.  Severe headache that lasts more than 48 hours, that is not relieved with pain medication,  increase in fluids, and caffeine.  Temperature greater than 101 degrees Fahrenheit.  Pain at the injection site not controlled by pain medication.  Redness, warmth, swelling, or bleeding or other drainage from the injection site  Inability to urinate  Stiff neck          For urgent questions or concerns post procedures:   Daytime hours please call [phone] [facility] nurse line    Evening and weekend hours call [phone]   For scheduling questions please contact our schedulers: Main scheduling line [phone]. Karen at [phone], Erika at [phone], or Arrolla at [phone] during business hours

## 2022-10-30 NOTE — Telephone Encounter (Signed)
Spoke with David Jordan to make him aware his procedure location has changed to the main hospital, pt repeated information back to me. No questions at this time

## 2022-10-31 ENCOUNTER — Inpatient Hospital Stay: Admit: 2022-10-31 | Discharge: 2022-11-20 | Payer: PRIVATE HEALTH INSURANCE | Attending: Family

## 2022-10-31 DIAGNOSIS — R55 Syncope and collapse: Secondary | ICD-10-CM

## 2022-10-31 LAB — MENINGITIS/ENCEPHALITIS PATHOGEN PANEL, PCR, CSF
Cryptococcus neoformans/gattii: NEGATIVE
Cytomegalovirus: NEGATIVE
Enterovirus: NEGATIVE
Escherichia coli K1: NEGATIVE
Haemophilus influenzae: NEGATIVE
Herpes Simplex Virus 1: NEGATIVE
Herpes Simplex Virus 2: NEGATIVE
Human Herpes Virus 6: NEGATIVE
Human Parechovirus: NEGATIVE
Listeria monocytogenes: NEGATIVE
Neisseria meningitidis: NEGATIVE
Streptococcus agalactiae: NEGATIVE
Streptococcus pneumoniae: NEGATIVE
Varicella Zoster Virus: NEGATIVE

## 2022-10-31 LAB — CRYPTOCOCCUS AG, CSF W/FUNGAL CULTURE (MINIMUM REQUIRED CSF VOLUME 1 ML): Crypto Ag, CSF: NEGATIVE

## 2022-10-31 LAB — MOVEMENT DISORDER, AUTOIMM/PARANEO, CSF
AGNA-1, CSF: NEGATIVE
AMPA-R Ab CBA, CSF: NEGATIVE
ANNA-1, CSF: NEGATIVE
ANNA-2, CSF: NEGATIVE
ANNA-3, CSF: NEGATIVE
AP3B2 IFA, CSF: NEGATIVE
Amphiphysin Ab, CSF: NEGATIVE
CASPR2-IgG CBA, CSF: NEGATIVE
CRMP-5-IgG Western Blot, CSF: NEGATIVE
DPPX Ab CBA, CSF: NEGATIVE
GABA-B-R Ab CBA, CSF: NEGATIVE
GAD65 Ab Assay, CSF: 0 nmol/L (ref <=0.02)
GFAP IFA, CSF: NEGATIVE
GRAF1 IFA, CSF: NEGATIVE
ITPR1 IFA, CSF: NEGATIVE
KLHL 11 Ab CBA, CSF: NEGATIVE
LGI1-IgG CBA, CSF: NEGATIVE
NIF IFA, CSF: NEGATIVE
NMDA-R Ab, CBA, CSF: NEGATIVE
Neurochondrin IFA: NEGATIVE
PCA-1, CSF: NEGATIVE
PCA-2, CSF: NEGATIVE
PCA-Tr, CSF: NEGATIVE
Septin-5 IFA, CSF: NEGATIVE
Septin-7 IF: NEGATIVE
mGluR1 IFA, CSF: NEGATIVE

## 2022-10-31 LAB — CSF CELL COUNT WITH DIFFERENTIAL (MINIMUM REQUIRED CSF VOLUME 1 ML)
RBC, CSF: 13 /uL — ABNORMAL HIGH (ref 0–10)
Total Nucleated Cells, CSF: 2 /uL (ref 0–5)

## 2022-10-31 LAB — OLIGOCLONAL BANDING CSF

## 2022-10-31 LAB — FLUID CELL COUNT, CSF TUBE 1(MINIMUM REQUIRED CSF VOLUME 1 ML)
RBC, CSF TUBE 1: 90 /uL — ABNORMAL HIGH (ref 0–10)
Total Nucleated Cells, CSF Tube 1: 3 /uL (ref 0–5)

## 2022-10-31 LAB — IGG SYNTHESIS RATE CSF+SERUM
Albumin, CSF: 26 mg/dL (ref 10–45)
Albumin: 4.6 g/dL (ref 4.1–5.1)
CSF/Serum Index: 6 (ref 0–8)
IgG Index, CSF: 0.6 (ref 0.0–0.7)
IgG Synthesis: 1 mg/d
IgG, CSF: 3.7 mg/dL (ref 0.0–10.3)
IgG/Alb. Ratio CSF: 0.14 (ref 0.00–0.25)
IgG: 1073 mg/dL (ref 603–1613)

## 2022-10-31 LAB — HERPES SIMPLEX VIRUS 1 & 2, REAL TIME PCR
HSV 1, PCR: NOT DETECTED
HSV 2 , PCR: NOT DETECTED

## 2022-10-31 LAB — FUNGUS CULTURE

## 2022-10-31 LAB — CSF CULTURE PLUS STAIN (MINIMUM REQUIRED VOLUME 1 ML)
Culture Result: NO GROWTH
Gram Stain Result: NONE SEEN

## 2022-10-31 LAB — VARICELLA-ZOSTER VIRUS DNA, QUAL REAL-TIME PCR: Varicella-Zoster, PCR: NEGATIVE

## 2022-10-31 LAB — GLUCOSE, CSF (MINIMUM REQUIRED CSF VOLUME 0.5 ML): Glucose, CSF: 61 mg/dL (ref 40–70)

## 2022-10-31 LAB — ANGIOTENSIN CONVERTING ENZYME, CSF: Angio Convert Enzyme, CSF: <1.5 U/L (ref 0.0–2.5)

## 2022-10-31 LAB — PROTEIN, CSF (MINIMUM REQUIRED CSF VOLUME 0.5 ML): Micro Total  Protein: 39 mg/dL (ref 15–45)

## 2022-10-31 LAB — VDRL, CSF: VDRL CSF: NONREACTIVE

## 2022-10-31 NOTE — H&P (Signed)
Neuroradiology Pre-Procedure History and Physical    Patient name: David Jordan  Patient MRN: [id number]  DOB: [birth year]  Age: 37 y.o.  Gender: male      34 Date:   10/31/2022     Physician:  Bertram Denver, CNP      Patient is a 37 y.o. male presenting to Interventional Radiology for fluoroscopic guided therapeutic lumbar puncture for headaches and episode of loss of consciousness. Reports headache today is 2-3/10.    Past Medical History:   Diagnosis Date    Allergy     Seasonal    Bilateral arm weakness     Headache     Memory loss     Migraines June 2024     Past Surgical History:   Procedure Laterality Date    PR EEG PHYS/QHP 2-12 HR WITH VEEG  09/22/2022    WISDOM TOOTH EXTRACTION       Allergies   Allergen Reactions    Codeine Hives and Nausea And Vomiting     Hives and Fever    Shellfish Derived Swelling and Rash     Current Outpatient Medications on File Prior to Encounter   Medication Sig Dispense Refill    atogepant (QULIPTA) 60 mg Tab Take 1 tablet (60 mg total) by mouth daily. 30 tablet 5    blood sugar diagnostic (GLUCOSE BLOOD) Strp Use to test blood sugar 1 times a day. Dx: hypoglycemia . Brand per pharmacy / insurance preference. 100 strip 3    blood-glucose meter (GLUCOSE MONITORING KIT) kit Use to test blood sugar 1 times a day. Dx: hypoglycemia . Brand per pharmacy / insurance preference. 1 kit 0    cyanocobalamin (VITAMIN B-12) 1000 MCG tablet Take 1 tablet (1,000 mcg total) by mouth daily. 30 tablet 0    divalproex (DEPAKOTE) 500 MG DR tablet Take 1 tablet (500 mg total) by mouth 2 times a day. 90 tablet 0    ibuprofen (MOTRIN) 200 MG tablet Take 1 tablet (200 mg total) by mouth every 6 hours as needed for Pain.      lancets Misc Use to test blood sugar 1 times a day. Dx: hypoglycemia . Brand per pharmacy / insurance preference. 100 each 3    lancets Misc Use to test blood sugar 1 times a day. Dx: hypoglycemia . Brand per pharmacy / insurance preference. 120 each 11     No current  facility-administered medications on file prior to encounter.       There were no vitals taken for this visit.  General appearance: alert and cooperative      Relavent Labs:  Lab Results   Component Value Date    WBC 5.7 10/24/2022    HGB 12.6 (L) 10/24/2022    HCT 36.4 (L) 10/24/2022    MCV 85.1 10/24/2022    PLT 230 10/24/2022     Lab Results   Component Value Date    CREATININE 0.87 10/23/2022     No results found for: INR, PROTIME  Lab Results   Component Value Date    ALT 21 10/21/2022    AST 15 10/21/2022    ALKPHOS 59 10/21/2022    BILITOT 1.0 10/21/2022         Sedation Type/Plan:  Local/Subcutaneous Lidocaine 1%    Imaging:    Imaging MRI: Results for orders placed during the hospital encounter of 10/21/22    MRI THORACIC SPINE W AND WO CONTRAST    Impression  Brain  1.  Normal  MRI brain without and with contrast.    MRV  1. Normal. No venous sinus thrombosis or stenosis.    Cervical spine  1.  No cord lesion or abnormal enhancement.  2.  Mild degenerative changes at C3-C6 without stenosis or neural compression.    Thoracic spine  1.  Negative. No cord lesion or abnormal enhancement.      Report Verified by: Larey Dresser, MD at 10/25/2022 1:17 PM EDT    Signed by: Conley Canal, MD on 10/25/2022  1:17 PM        Assessment/Plan:      This procedure has been fully reviewed with the patient and written informed consent has been obtained.    Prepped therapeutic lumbar puncture    Time Spent with Patient:  Face-to-face time spent with patient   15 minutes. Time preparing for appointment 15 minutes      Arlyn Dunning CNP

## 2022-10-31 NOTE — Nursing Note (Signed)
Patient tolerated the procedure without complications. Patient at baseline  neurologically and able to ambulate without difficulty. Post-op instructions  reviewed and received. Questions answered. Patient verbalizes understanding.  Phone numbers provided to patient to call with any questions or complications.  Patient has all of their personal belongings. Patient discharged home with  family at this time.

## 2022-10-31 NOTE — Discharge Instructions (Signed)
Lumbar Puncture        Post Procedure Information:     Have a responsible adult drive you home and remain with you for the rest of the day.  After leaving the facility: you may ride home in the vehicle, you may sit up and eat/ drink, you may use the restroom. Otherwise, you should lay flat for the next 24 hours. You can lay on your back or side with no more than 2 pillows. Preferably NOT a recliner. No exertional activity for 24 hours. If a headache develops after 24 hours of bedrest: lay back down for another 24 hours and increase your fluid and caffeine intake.      DO NOT drive, exercise, or perform strenuous activity.  Do NOT take Aspirin, NSAIDS (ibuprofen or Aleve), or other blood thinning medication for 48 hours after the procedure.  Leave bandage in place for 24 hours  You may shower after 24 hours  You may experience back discomfort.  We recommend Tylenol and ice for pain control.           Spinal Headache:    Typically, spinal headaches occur the day after a lumbar puncture.  This headache is postural.  You will notice that when you get out of bed and are upright that you will have a significant headache.  When you lie down, that headache should go away.  This headache will typically not respond to analgesics.           Conservative treatment:    Bedrest:  Lay down flat with head elevated on a maximum of two pillows for an additional 8-10 hours.  Hydration:  Drink one 8 ounce glass of water every hour while awake to prevent dehydration  Analgesics:  Take Tylenol every 4-6 hours for 24 hours or other prescribed pain medicine, as indicated.  Caffeine:  Caffeine intake is a therapy to help shrink the cerebral vessels.  Caffeinated beverages can be somewhat effective.        Problems to report to your doctor:     New back or leg pain that does not go away or increases in intensity.  New numbness or tingling of the legs.  Severe headache that lasts more than 48 hours, that is not relieved with pain medication,  increase in fluids, and caffeine.  Temperature greater than 101 degrees Fahrenheit.  Pain at the injection site not controlled by pain medication.  Redness, warmth, swelling, or bleeding or other drainage from the injection site  Inability to urinate  Stiff neck          For urgent questions or concerns post procedures:   Daytime hours please call [phone] [facility] nurse line    Evening and weekend hours call [phone]   For scheduling questions please contact our schedulers: Main scheduling line [phone]. Karen at [phone], Erika at [phone], or Arrolla at [phone] during business hours

## 2022-10-31 NOTE — Telephone Encounter (Signed)
Patient's wife called to check on the status of her request. Patient was recently discharged and needs a HDF

## 2022-10-31 NOTE — Nursing Note (Signed)
Discharge instructions and handout discussed with the patient and all questioned fully answered. He will call if any problems arise. Phone numbers provided.

## 2022-10-31 NOTE — Procedures (Signed)
Neuroradiology Spine Post Procedure Note  Date: 10/31/2022  Patient: David Jordan  MRN: [id number]      A formal time out was performed identifying the correct patient, correct procedure, reviewing anticoagulation status, reviewing allergies and verifying the correct sites and side.       Procedure:     Fluoroscopic guided therapeutic lumbar puncture  Placement Lumbar L2 - L3:  Right             Needle - 22 gauge    Type: PP    OP 14 Cm H2O      Operators:   Arlyn Dunning CNP      Specimen:   Approximately (18) mL of CSF collected    Medications:  3ml Lidocaine (1%) Subcutaneous    Estimated Blood Loss:  Minimal (Less Than 5 mL)     Complications:  None.        Time Spent With Patient:   Procedure time spent with patient >35 Minutes.      Arlyn Dunning CNP

## 2022-11-01 ENCOUNTER — Inpatient Hospital Stay: Payer: PRIVATE HEALTH INSURANCE | Attending: Family

## 2022-11-01 NOTE — Telephone Encounter (Signed)
Please give them a same day tomorrow. Preferably the 11:30 to get him in sooner in the day.

## 2022-11-01 NOTE — Telephone Encounter (Signed)
Called and scheduled

## 2022-11-02 ENCOUNTER — Ambulatory Visit: Admit: 2022-11-02 | Discharge: 2022-11-06 | Payer: PRIVATE HEALTH INSURANCE

## 2022-11-02 DIAGNOSIS — R4182 Altered mental status, unspecified: Secondary | ICD-10-CM

## 2022-11-02 NOTE — Progress Notes (Signed)
[facility] [place]  [facility]  [address]  [name] [address]    Name:  David Jordan Date of Birth: 12/23/1985 (37 y.o.)   MRN: [id number]    Date of Service:  11/02/2022   11:54 AM      Subjective   Chief Complaint:     Chief Complaint   Patient presents with    Follow-up     The patient is here for a hospital discharge follow-up.       History of Present Illness:   David Jordan is a(n) 37 y.o. male here today for the following:     HPI      Neuro said 90 days after last episode, but they have not technically stopped.   Notes that he is having a very hard time sleeping. Is trying to sleep, and this doesn't work. Couldn't sleep in the hospital so suspected better at home, but then didn't improve there either. Sleeps a few minutes and then wakes right back up.       Notes that since these episodes have been associated with some poor sleep. Started after onset of symptoms, but very poor sleep since start. Even with addition of depakote, he has a very hard time sleeping. Since start of these episodes, he has 1-2 thoughts at a time. Notes that he used to have multiple thoughts at once, and those have changed since this.       Current Outpatient Medications:  Current Outpatient Medications   Medication Sig Dispense Refill    atogepant (QULIPTA) 60 mg Tab Take 1 tablet (60 mg total) by mouth daily. 30 tablet 5    blood sugar diagnostic (GLUCOSE BLOOD) Strp Use to test blood sugar 1 times a day. Dx: hypoglycemia . Brand per pharmacy / insurance preference. 100 strip 3    blood-glucose meter (GLUCOSE MONITORING KIT) kit Use to test blood sugar 1 times a day. Dx: hypoglycemia . Brand per pharmacy / insurance preference. 1 kit 0    cyanocobalamin (VITAMIN B-12) 1000 MCG tablet Take 1 tablet (1,000 mcg total) by mouth daily. 30 tablet 0    divalproex (DEPAKOTE) 500 MG DR tablet Take 1 tablet (500 mg total) by mouth 2 times a day. 90 tablet 0    ibuprofen (MOTRIN)  200 MG tablet Take 1 tablet (200 mg total) by mouth every 6 hours as needed for Pain.      lancets Misc Use to test blood sugar 1 times a day. Dx: hypoglycemia . Brand per pharmacy / insurance preference. 100 each 3    lancets Misc Use to test blood sugar 1 times a day. Dx: hypoglycemia . Brand per pharmacy / insurance preference. 120 each 11     No current facility-administered medications for this visit.         ROS:   Review of Systems  See HPI.       Objective:     Vitals:    11/02/22 1128   BP: 116/72   Pulse: 80   SpO2: 100%   Weight: 138 lb (62.6 kg)     Body mass index is 20.38 kg/m.    Physical Exam    Physical Exam   Constitutional: He is oriented to person, place, and time. Appears unwell. Eyes appear fatigued and strained. Pale.   HENT:   Head: Normocephalic and atraumatic.   Right Ear: External ear normal.   Left Ear: External ear normal.   Nose:  Nose normal.    Eyes: Conjunctivae normal and EOM are normal. Pupils are equal, round.  Neck: Normal range of motion. Neck supple.   Musculoskeletal: Normal range of motion.   Neurological: He is alert and oriented to person, place, and time. He exhibits normal muscle tone. Coordination normal.   Skin: Skin is warm and dry. No rash noted. He is not diaphoretic. No erythema. No pallor.   Psychiatric: He has a normal mood and affect. His behavior is normal. Judgment and thought content normal.              Assessment/Plan:   There are no diagnoses linked to this encounter.    No follow-ups on file.       1.) Headaches - bizarre cluster of symptoms yet to be satisfactorily explained. Unable to work due to driving restrictions for altered level of consciousness.     Technically first day missed is 22nd of July.       Insomnia - notes that this disorder has taken all of [his] energy to start with. But aware he is tired, and feels like he should be able to sleep. Lays down to sleep and nothing happens.       Ok with taking off the next 90 days given neuro telling  him this may take 9 months or more to improve things sufficiently.         Little bursts of energy where I'll be up moving around like normal. Then will quickly be exhausted and go lay back down.         Wt Readings from Last 3 Encounters:   11/02/22 138 lb (62.6 kg)   10/25/22 141 lb (64 kg)   10/22/22 140 lb (63.5 kg)         Appetite is coming back a little bit, but not much.         Was getting blood sugars due to recommendation from Dr. Tessa Lerner to check sugars, but A1Cs were ok, so eventually ended up stopping this.         Last episode was last night. Before it happened, he was talking out of the ordinary and wife knew his brain was somewhere else. Then he said didn't we already talk about this and didn't this already happen. An hour later eating dinner his eyes completely zoned out and his pupils shrank, and he looked straight ahead. Then closed his eyes and said give me a minute. Didn't twithc or shake, didn't move. Put his head on his knee and closed his eyes and sat there for about a minute, then looked up at his wife and said ok I'm back. Entire episode about five minutes.               No LOS data to display        11:54 AM       Venita Sheffield, PA

## 2022-11-07 NOTE — Telephone Encounter (Signed)
Patients wife called and asked about most recent lab results and what are the next steps? She said he is not getting any better and is very concerned.

## 2022-11-07 NOTE — Addendum Note (Signed)
Addended by: Jerelene Redden on: 11/07/2022 04:54 PM     Modules accepted: Orders

## 2022-11-07 NOTE — Telephone Encounter (Signed)
Lab Results   Component Value Date    TSH 1.33 10/21/2022     Lab Results   Component Value Date    CREATININE 0.87 10/23/2022    BUN 12 10/23/2022    NA 140 10/23/2022    K 3.9 10/23/2022    CL 106 10/23/2022    CO2 30 10/23/2022     Lab Results   Component Value Date    WBC 5.7 10/24/2022    HGB 12.6 (L) 10/24/2022    HCT 36.4 (L) 10/24/2022    MCV 85.1 10/24/2022    PLT 230 10/24/2022     No results found for: PHVEN, PCO2, PO2VEN, HCO3VEN, BEVEN, HBO2PER, O2SATVEN    Lab Results   Component Value Date    IRON 144 10/20/2022    TIBC 335 10/20/2022     Lab Results   Component Value Date    VITAMINB12 207 10/20/2022               Get updated labs. See opthalmo, to see if they can offer any additional insight. Sleep study as well given onset of severe insomnia.     Keep [place] closely posted. How is he sleeping?                  Diagnoses and all orders for this visit:    Intractable cluster headache syndrome, unspecified chronicity pattern  -     Vitamin B12; Future  -     Methylmalonic acid, serum; Future  -     Cortisol; Future  -     DHEA-sulfate, serum; Future  -     TSH (Thyroid Stimulating Hormone); Future  -     T4, free; Future  -     T3, Total; Future  -     ACTH; Future  -     Amb referral to Ophthalmology    Disorientation  -     Vitamin B12; Future  -     Methylmalonic acid, serum; Future  -     Cortisol; Future  -     DHEA-sulfate, serum; Future  -     TSH (Thyroid Stimulating Hormone); Future  -     T4, free; Future  -     T3, Total; Future  -     ACTH; Future  -     Amb referral to Ophthalmology    Insomnia, unspecified type  -     Vitamin B12; Future  -     Methylmalonic acid, serum; Future  -     Cortisol; Future  -     DHEA-sulfate, serum; Future  -     TSH (Thyroid Stimulating Hormone); Future  -     T4, free; Future  -     T3, Total; Future  -     ACTH; Future  -     Amb referral to Ophthalmology

## 2022-11-08 NOTE — Telephone Encounter (Signed)
Pt spouse calling in stating that pt needs apt due to numerous symptoms. Pt dr wants him to be seen within a week, pt is having seizure like symptoms but they want rule out anything with his eyes. Pt is experiencing blurry vision, especially during a headache. Pt cannot focus his eyes with or without glasses when experiencing headache as well.  PH: [phone]

## 2022-11-09 ENCOUNTER — Ambulatory Visit: Admit: 2022-11-09 | Payer: PRIVATE HEALTH INSURANCE

## 2022-11-09 DIAGNOSIS — G44001 Cluster headache syndrome, unspecified, intractable: Secondary | ICD-10-CM

## 2022-11-09 LAB — TSH: TSH: 1.73 u[IU]/mL (ref 0.45–4.12)

## 2022-11-09 LAB — METHYLMALONIC ACID, SERUM: Methylmalonic Acid: 108 nmol/L (ref 0–378)

## 2022-11-09 LAB — T4, FREE: Free T4: 0.88 ng/dL (ref 0.61–1.76)

## 2022-11-09 LAB — ACTH: ACTH: 47.9 pg/mL (ref 7.2–63.3)

## 2022-11-09 LAB — VITAMIN B12: Vitamin B-12: 936 pg/mL — ABNORMAL HIGH (ref 180–914)

## 2022-11-09 LAB — CORTISOL: Cortisol: 9.8 ug/dL

## 2022-11-09 LAB — DHEA-SULFATE: DHEA Sulfate: 406 ug/dL (ref 106–464)

## 2022-11-09 LAB — T3: T3, Total: 93.1 ng/dL (ref 60.0–220.0)

## 2022-11-10 NOTE — Telephone Encounter (Signed)
Future Appointments   Date Time Provider Department Center   11/22/2022  9:30 AM OPTH URGENT [facility]   01/16/2023  3:30 PM Alyson Misty Stanley, MD [facility] NEUR [facility] [place]       Confiimed above ophth appt with patient's wife

## 2022-11-14 NOTE — Telephone Encounter (Signed)
B12 has risen appropriately. Can ease back on supplement, but would not stop it entirely. Continue with specialty referrals.

## 2022-11-14 NOTE — Telephone Encounter (Signed)
Already done and awaiting fax.

## 2022-11-14 NOTE — Telephone Encounter (Signed)
Routing to MD.

## 2022-11-14 NOTE — Addendum Note (Signed)
Addended by: Jerelene Redden on: 11/14/2022 04:55 PM     Modules accepted: Orders

## 2022-11-21 NOTE — Telephone Encounter (Signed)
Called patient and left message to return call

## 2022-11-22 ENCOUNTER — Ambulatory Visit: Admit: 2022-11-22 | Discharge: 2022-11-22 | Payer: PRIVATE HEALTH INSURANCE

## 2022-11-22 DIAGNOSIS — Z0100 Encounter for examination of eyes and vision without abnormal findings: Secondary | ICD-10-CM

## 2022-11-22 NOTE — Patient Instructions (Signed)
RTC n/a general clinic for DFE  RTC n/a HVF 24-2

## 2022-11-22 NOTE — Progress Notes (Signed)
Ophthalmology Clinic Note    Patient's Name: David Jordan, 1985-10-12  11/22/2022    SUBJECTIVE:  CC: No chief complaint on file.       HPI:  [name] is a 37 y.o. male who presents today for new patient visit.    Recent ED admission with new neurologic symptoms that began abruptly on June 13th 2024 (prior to that only some allergies and mild sinus headaches) that consist of headache, nausea, abdominal pain, and 4 limb sensory symptoms.     Pt is unsure why he has this appt. PCP wanted him to see [place]. When he has seizure episodes he has tunnel vision. Periphery goes black but central vision ok. Pt mentions that since he started Depakote he maintains some level of consciousness.    Intermittent right eye blurriness for that past few years. Episodes last hours - days. Not associated with episodes, allergies. No flashes, new floaters.  No red eye, painful eye. No scotoma, double vision, or other eye complaints.     POHx:   Myopia    Ocular meds:   -  [employer] AT once daily in the AM    FHx:   No FHx of ocular disease    PMHx:   Past Medical History:   Diagnosis Date    Allergy     Seasonal    Bilateral arm weakness     Headache     Memory loss     Migraines June 2024       Allergies:   Allergies   Allergen Reactions    Codeine Hives and Nausea And Vomiting     Hives and Fever    Shellfish Derived Swelling and Rash       ROS: Reviewed with no pertinent positives except as listed above.    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     RTC n/a general clinic for routine DFE   RTC n/a HVF for baseline     Future Appointments   Date Time Provider Department Center   12/05/2022  4:00 PM Galvin Proffer, MD [facility] NEUR [facility]   01/16/2023  3:30 PM Nadara Mode Misty Stanley, MD [facility] NEUR [facility] [place]   03/07/2023 11:00 AM OPTH [facility]   03/20/2023  9:00 AM Charolotte Capuchin, MD [facility]       Dr. Tresa Endo has reviewed the history and examined the patient and agrees with the assessment and plan as documented above.    SIGNED  Delrae Alfred, MD  Ophthalmology Resident, PGY-2   5:50 PM 11/22/2022

## 2022-11-22 NOTE — Progress Notes (Signed)
I saw and evaluated the patient, and discussed with the resident. I agree with the residentâs findings and plan as documented in the residentâs note.

## 2022-11-22 NOTE — Telephone Encounter (Signed)
Called and spoke with patient Scheduled Appointment  ON 11-28-2022 AT 3:30 pm AT [facility] Call Complete

## 2022-11-24 NOTE — Telephone Encounter (Signed)
Routing to MD.

## 2022-11-24 NOTE — Telephone Encounter (Signed)
Pt calling  Pt is having side effects from Depakote   Pt is having constipation dark urine stomach pain that has moved up to rib cage. Pt started this on 8/20 and the symptoms started then but pain in stomach is getting worse Can Pt stop medication     Pt [phone]

## 2022-11-24 NOTE — Telephone Encounter (Signed)
Yes. Ok to discontinue.     Called and spoke to patient aware of Doctors Orders also if he gets Worse to call 911 . Call Complete

## 2022-11-27 ENCOUNTER — Ambulatory Visit: Admit: 2022-11-27 | Discharge: 2023-01-07 | Payer: PRIVATE HEALTH INSURANCE | Attending: Neurology

## 2022-11-27 DIAGNOSIS — G43E19 Chronic migraine with aura, intractable, without status migrainosus: Secondary | ICD-10-CM

## 2022-11-27 DIAGNOSIS — R4189 Other symptoms and signs involving cognitive functions and awareness: Secondary | ICD-10-CM

## 2022-11-27 NOTE — Progress Notes (Unsigned)
Patient: David Jordan  Date of Birth: 04-01-1986  MRN: [id number]           General Neurology Progress Note    Chief Complaint   Patient presents with    Follow-up     Patient is accompanied by his wife.    HPI     David Jordan is a 37 y.o. R-handed male who presents to [facility] for evaluation of altered mental status. Referred by Referral, Self.    Initial history:  First one - September 13, 2022.    About a week before going to the hospital ([facility] ED 09/21/22 - 09/22/22) he laid down in bed and he could not feel his right arm at all. Absolutely no sensation in his right arm. This episode lasted maybe 5-6 minutes, then completely abated on it's own - just full sensation back.      Week after that had full body flush and went to the restroom and thought he was going to throw up Suddenly stuck and unable to move.     Per PCP note after ED discharge:  Two days after that, happened again in the middle of the night. Woke up feeling like he was going to vomit. Body sweats. Unable to move any limbs. Finally subsided, woke up his wife, and went to hospital.       After these episodes, getting insane headaches and did not have headaches prior to these paralysis episodes. Will suddenly be forgetful - like a switch flipped and cannot recall much of anything. Will get somewhere and he doesn't know how they got there. Episode of full body paralysis he was hallucinating and talking to himself, muttering to himself. This lasted for hours. Awake but didn't remember anything. Eyes were darting like he was drunk. Couldn't follow her finger. Couldn't walk straight. For hours after these episodes he doesn't necessarily remember things. He has never lost his phone before, and had to find it recently because he had no idea where it was and couldn't remember where he had been that day to look for it.      When he was hallucinating he would sometimes make full  sentences, but didn't make sense. He would say we've been talking about it for the past hour and they hadn't been talking about anything. Then muttering and I just told you, we just talked about this. Was getting giggly at times but then would go back to sleep. But would also get frustrated insisting they had just talked about these things.      Headaches have not improved at all. Has had rare sinus headaches in the past, but these are his entire head hurts like an extreme pressure. When he remembers to take medication, he will take ibuprofen or acetaminophen, which helps, but doesn't fully resolve it.      No changes in schedule, diet, hydration. Was told potassium and vitamin D were low.      Few hours after the headaches have happened at least twice, but without him knowing if his body went numb, etc.    Headache  Prior to episode, has 30 seconds:  Full body flush  Nausea    After warning, he gets extreme head pain. Then he cannot move or feel his arms.  One of the episodes, he had full body affected.    Following the episode:  He gets hallucinations and amnesia  Duration 2 hours    Major episodes last  about 2-3 hours.    Alleviated by sleep, rest.    Started ibuprofen 200 mg as needed - no benefit    Tried to do MRI brain, but he got significantly claustrophobic, and was unable to stay in the machine.      INTERIM HISTORY:  Initial consult: 10/16/22    He developed stomach pains on Depakote. HAs improved but had side effects stomach pain and mood changes. Overall, pain is better.  Called his PCP's office about the pains and was sent to GI.  Colonoscopy is planned. He had CT abdomen.    Headaches and other symptoms were improved on Depakote.        10/14/2022    11:11 AM   AMB NEURO HEADACHE QUESTIONNAIRE   At what age (year) did you first start getting headaches? 36   Which part of the head hurts when you get the headache? (Example: temple, frontal...etc.) Starts in back around neck and moves to how head very  quickly   What does the headache pain feel like? (e.g sharp, stabbing, throbbing, dull ache...etc.) Sharp pain   How many days in a month do you have headaches, this includes ALL types of headaches?  15   How many days in a month do you have headaches that affect your abillity to function?  15   How long does each headache last? (can provide a range) All day but dulls down a lot when taking ibuprofen   How severe are your headaches on a scale of 1-10 with 10 being the most severe pain you ever experienced? (can provide a range) 3-5 with ibuprofen, 8 when first waking up or when i have exceded the recommended timing on ibuprofen   Sensitivity to light? Yes   Sensitivity to noise? Yes   Sensitivity to smells? (e.g. chemicals, perfume) Yes   Sensitivity to movement? No   Nausea? Yes   Vomiting? No   Visual disurbances? (e.g. seeing flashing lights, spots, zigzag lines...etc.) Yes   Tingling or numbness? Yes   Difficulty speaking? No   Poor concentration? Yes   Dizziness? No   Weakness on one side of the body? Yes   Runny nose or congestion on one side, the same side of the headache? No   Tearing, eye redness, or eyelid droop on one side, the same side of the headache?  No   Double vision? No   Complete loss of vision in both eyes for a few seconds? No   What do you prefer to do when you have a headache? (e.g. lie down in a dark quiet room, stay active, ice packs, heating pads...etc.) I try to stay as active as i can, but when that doesnt work i will lay down, normally end up sleeping a few hours   Does your headache significantly change (better or worse) if you lie down as opposed to standing? Better   Does coughing, sneezing, or straining significantly affect the severity of your headache? Does not affect my headaches   Do you know what triggers your headaches? If so, please explain.  No, never had headaches until these started a few weeks ago   Difficulty falling asleep? No   Difficulty staying asleep? No   Snoring?  No   Excessive sleepiness during the day? No   Tooth grinding or clenching? No   Chronic neck pain? No   Have you had any major increase in stress recently? (e.g. significant life event, death in the family...etc.). If so, please  explain. No   Does anyone in your family have history of migraine or frequent headaches? If so, who? No   Medication 1 (specify medication name, dose, frequency, side effects (if any), and whether the medication helped): Ibuprofen 200mg  every 6 hours or longer depending on when I sleep   Medication 2 (specify medication name, dose, frequency, side effects (if any), and whether the medication helped): Acetaminophen 500 only between the ibuprofen when it is not dulling it enogh   Medication 1 (specify medication name, dose, frequency, side effects (if any), and whether the medication helped): No     STOP-BANG Score for OSA:  High risk: 5-8   Intermediate risk: 3-4    Low risk 0-2       10/14/2022    11:00 AM   Stop Brennan Bailey Questionnaire   Does the patient snore loudly (louder than talking or loud enough to be heard through closed doors)? 0   Do you often feel tired, fatigued, or sleepy during daytime? 1   Has anyone observed you stop breathing during your sleep? 0   Do you have or are you being treated for high blood pressure? 0   Is your BMI more than 35 kg/m2? 0   Age over 81 yr old? 0   Neck circumference greater than 16in/40 cm? 0   Gender male? 1   Total Score 2     Montreal Cognitive Assesment                                       10-13-2022 [facility]     ASSESSMENT:      ICD-10-CM    1. Intractable chronic migraine with aura and without status migrainosus  G43.E19       2. Cognitive and behavioral changes  R41.89     R46.89           David Jordan is a 37 y.o. male with no significant past medical history presents after having cognitive/behavioral changes in early June 2024 which started abruptly along with acute headaches. His headaches vary in intensity but are daily,  constant. ED admission demonstrated that cEEG was negative for seizures.  CT Head was negative.  Overall, symptoms are slightly improving.  His symptoms of spells of paresthesias, immobility, nausea, and headache could represent migraines and would benefit from [place] as they are occurring daily.    PLAN:    No orders of the defined types were placed in this encounter.    Headache education:  Discussed pathophysiology of headache.    Discussed use of headache diary.    Discussed triggers and lifestyle modifications including adequate fluid intake without caffeine, routine exercise, not skipping meals, and a consistent sleep wake cycle.  The patient was given a list of common headache triggers and instructed on how to keep a headache diary.  Instructed patient about medications.    Discussed potential adverse effects and drug interactions of medications.    Discussed medication overuse headache and to limit use of analgesics to less than 2 doses per week.      Preventive medication recommended: Qulipta.       Abortive medication recommended: Medrol dose pack, ibuprofen        Common side effects of the prescribed medications discussed with patient      Tests or labs ordered: none    Recommended to follow up with PCP for  general medical issues.    Patient verbalized understanding of the plan and encouraged to call my office at [phone] for further questions and concerns.    No follow-ups on file.         Subjective     Other Indicators of Depression/Anxiety Severity:  PHQ-9 Scores:        No data to display              GAD7 Scores:       10/14/2022    10:52 AM   GAD7Total Score   GAD-7 Total Score 5     Histories:     He has a past medical history of Allergy, Bilateral arm weakness, Headache, Memory loss, Migraines (June 2024), and Seizures (CMS-HCC) (June 2024).    He has a past surgical history that includes Wisdom tooth extraction and pr eeg phys/qhp 2-12 hr with veeg (09/22/2022).    His family history  includes Cancer in his maternal uncle; Diabetes in his father; Heart disease in his father.    He reports that he has never smoked. He has never used smokeless tobacco. He reports that he does not currently use alcohol. He reports that he does not use drugs.   Marital status: Married.    Employed: [employer], Art gallery manager in lab.  7 years of service in [place]; stationed in [place], [place].    Allergies:   Codeine and Shellfish derived    Medications:     Outpatient Medications Prior to Visit   Medication Sig Dispense Refill    cyanocobalamin (VITAMIN B-12) 1000 MCG tablet Take 1 tablet (1,000 mcg total) by mouth daily. 30 tablet 0    ibuprofen (MOTRIN) 200 MG tablet Take 1 tablet (200 mg total) by mouth every 6 hours as needed for Pain.      atogepant (QULIPTA) 60 mg Tab Take 1 tablet (60 mg total) by mouth daily. 30 tablet 5    divalproex (DEPAKOTE) 500 MG DR tablet Take 1 tablet (500 mg total) by mouth 2 times a day. 90 tablet 0     No facility-administered medications prior to visit.        Review of Systems  Answers submitted by the patient for this visit:  Review of Systems (Submitted on 10/14/2022)  Activity Change: No  Appetite change: No  Chills: Yes  Diaphoresis (Excessive Sweating): No  Fatigue: Yes  Fever: No  Unexpected Weight Change: No  Facial Swelling: No  Neck Pain: No  Neck Stiffness: No  Ear Discharge: No  Hearing Loss: No  Ear Pain: No  Tinnitus (ringing in ears): No  Nosebleeds: No  Congestion: No  Rhinorrhea (Excessive discharge from the nose): No  Postnasal Drip: No  Sneezing: No  Sinus Pressure: No  Dental Problems: No  Drooling: No  Mouth Sores: No  Sore Throat: No  Trouble Swallowing: No  Voice Change: No  Eye Discharge: No  Eye Itching: No  Eye Pain: No  Eye Redness: No  Photophobia (Sensitivity to Light): No  Visual Disturbance: No  Apnea: No  Chest Tightness: No  Choking: No  Cough: No  Shortness of Breath: No  Stridor (High pitched wheezing): No  Wheezing: No  Chest Pain: No  Leg Swelling:  No  Palpitations (Abnormal heartbeat): No  Abominal distention (Abdominal Swelling): No  Abdominal Pain: No  Anal Bleeding: No  Blood in Stool: No  Constipation: No  Diarrhea: No  Nausea: No  Rectal Pain: No  Vomiting: No  Cold Intolerance: No  Heat Intolerance: No  Polydipsia (excessive thirst): No  Polyphagia (increased appetite): No  Polyuria (frequent urination): No  Difficulty urinating: No  Dysuria (Pain with Urination): No  Enuresis (Urinary incontinence): No  Flank pain (Pain on one side between upper abdomen and back): No  Frequency (urine): No  Genital sore: No  Hematuria (blood in urine): No  Urgency (urine): No  Urine decreased: No  Arthralgias (Joint pain): No  Back pain: No  Gait problem (Problems walking): No  Joint swelling: No  Myalgias (Muscle Pain): No  Color change: No  Pallor (Pale skin): No  Rash : No  Wound: No  Environmental allergies: Yes  Food Allergies : Yes  Immunocompromised: No  Dizziness: Yes  Facial asymmetry: No  Headaches : Yes  Light-headedness: Yes  Numbness: Yes  Seizures: No  Speech difficulty: No  Syncope (fainting): No  Tremors: No  Weakness: Yes  Adenopathy (swollen lymph): No  Bruises/bleeds easily: No  Agitation: No  Behavior problem: No  Confusion: Yes  Decreased concentration: Yes  Dysphoric mood (unpleasant mood): No  Hallucinations: No  Hyperactive: No  Nervous/anxious: Yes  Self-injury: No  Sleep disturbance: No  Suicidal ideas: No    The following portions of the patient's history were reviewed and updated as appropriate:  allergies, current medications, past family history, past medical history, past social history, past surgical history, problem list and review of systems.    Objective    BP 119/73 (BP Location: Left upper arm, Patient Position: Standing, BP Cuff Size: Regular)   Pulse 73   Ht 5' 9 (1.753 m)   Wt 141 lb 9.6 oz (64.2 kg)   SpO2 100%   BMI 20.91 kg/m   Wt Readings from Last 3 Encounters:   11/27/22 141 lb 9.6 oz (64.2 kg)   11/02/22 138 lb  (62.6 kg)   10/25/22 141 lb (64 kg)       Neurologic Exam     Mental Status   Oriented to person, place, and time.   Speech: speech is normal and not slurred   Level of consciousness: alert    Cranial Nerves     CN III, IV, VI   Pupils are equal, round, and reactive to light.  Extraocular motions are normal.   Nystagmus: none     CN V   Facial sensation intact.     CN VII   Facial expression full, symmetric.     CN VIII   CN VIII normal.     CN IX, X   CN IX normal.   CN X normal.     CN XI   CN XI normal.     CN XII   CN XII normal.     Motor Exam   Muscle bulk: normal  Overall muscle tone: normal    Strength   Strength 5/5 except as noted.     Sensory Exam   Light touch normal.     Gait, Coordination, and Reflexes     Gait  Gait: normal    Coordination   Finger to nose coordination: normal    Reflexes   Right brachioradialis: 2+  Left brachioradialis: 2+  Right biceps: 2+  Left biceps: 2+  Right triceps: 2+  Left triceps: 2+  Right patellar: 2+  Left patellar: 2+  Right achilles: 2+  Left achilles: 2+  Right plantar: normal  Left plantar: normal       Physical Exam  Eyes:      Extraocular Movements:  EOM normal.      Pupils: Pupils are equal, round, and reactive to light.   Neurological:      Mental Status: He is alert and oriented to person, place, and time.      Coordination: Finger-Nose-Finger Test normal.      Gait: Gait is intact.      Deep Tendon Reflexes:      Reflex Scores:       Tricep reflexes are 2+ on the right side and 2+ on the left side.       Bicep reflexes are 2+ on the right side and 2+ on the left side.       Brachioradialis reflexes are 2+ on the right side and 2+ on the left side.       Patellar reflexes are 2+ on the right side and 2+ on the left side.       Achilles reflexes are 2+ on the right side and 2+ on the left side.  Psychiatric:         Speech: Speech normal. Speech is not slurred.         Behavior: Behavior normal.         Thought Content: Thought content normal.         Judgment:  Judgment normal.         Labs Reviewed:   Iron   Date Value Ref Range Status   10/20/2022 144 50 - 212 ug/dL Final   16/01/9603 540 50 - 212 ug/dL Final     TIBC   Date Value Ref Range Status   10/20/2022 335 261 - 462 ug/dL Final   98/02/9146 829 261 - 462 ug/dL Final     % Iron Saturation (%)   Date Value   10/20/2022 43.0   10/16/2022 35.7     TSH   Date Value Ref Range Status   11/09/2022 1.73 0.45 - 4.12 uIU/mL Final     Free T4   Date Value Ref Range Status   11/09/2022 0.88 0.61 - 1.76 ng/dL Final     Comment:     Biotin megadosing (consumption >300 mcg/day) may falsely elevate free T4. When indicated, discontinue megadosing for 1 week and repeat testing.     Vitamin B-12   Date Value Ref Range Status   11/09/2022 936 (H) 180 - 914 pg/mL Final     Folic Acid   Date Value Ref Range Status   10/16/2022 17.50 5.90 - 24.80 ng/mL Final     Vit D, 25-Hydroxy (ng/mL)   Date Value   10/16/2022 30.4   09/21/2022 28.6 (L)     Lab Results   Component Value Date    WBC 5.7 10/24/2022    HGB 12.6 (L) 10/24/2022    HCT 36.4 (L) 10/24/2022    MCV 85.1 10/24/2022    PLT 230 10/24/2022     Lab Results   Component Value Date    GLUCOSE 81 10/23/2022    BUN 12 10/23/2022    CO2 30 10/23/2022    CREATININE 0.87 10/23/2022    K 3.9 10/23/2022    NA 140 10/23/2022    CL 106 10/23/2022    CALCIUM 9.0 10/23/2022     Lab Results   Component Value Date    HGBA1C 5.1 10/22/2022       Diagnostic Testing:    cEEG start date & time: 09/21/2022 at 1036  cEEG end date & time: 09/22/2022 at 0700    EEG Interpretation:  This EEG is normal in the awake, drowsy, and sleep state. No events, electrographic seizures or non-convulsive status epilepticus seen over the entire monitoring period.     CT Head wo contrast (09/21/22, [facility])  1.  No acute intracranial abnormality.   2.  No intracranial mass effect or hemorrhage.    CT head results:  CT Head WO contrast    Result Date: 09/21/2022  IMPRESSION: 1.  No acute intracranial abnormality. 2.  No  intracranial mass effect or hemorrhage. Approved by Estanislado Pandy, MD on 09/21/2022 4:50 AM EDT I have personally reviewed the images and I agree with this report. Report Verified by: Letitia Caul, MD at 09/21/2022 5:06 AM EDT   Brain vessel imaging results:  MRI Angio Head W and WO contrast    Result Date: 10/25/2022  IMPRESSION: Brain 1.  Normal MRI brain without and with contrast. MRV 1. Normal. No venous sinus thrombosis or stenosis. Cervical spine 1.  No cord lesion or abnormal enhancement. 2.  Mild degenerative changes at C3-C6 without stenosis or neural compression. Thoracic spine 1.  Negative. No cord lesion or abnormal enhancement. Report Verified by: Larey Dresser, MD at 10/25/2022 1:17 PM EDT   Spine imaging results:  MRI Cervical spine W WO contrast    Result Date: 10/25/2022  IMPRESSION: Brain 1.  Normal MRI brain without and with contrast. MRV 1. Normal. No venous sinus thrombosis or stenosis. Cervical spine 1.  No cord lesion or abnormal enhancement. 2.  Mild degenerative changes at C3-C6 without stenosis or neural compression. Thoracic spine 1.  Negative. No cord lesion or abnormal enhancement. Report Verified by: Larey Dresser, MD at 10/25/2022 1:17 PM EDT    MRI Thoracic spine W WO contrast    Result Date: 10/25/2022  IMPRESSION: Brain 1.  Normal MRI brain without and with contrast. MRV 1. Normal. No venous sinus thrombosis or stenosis. Cervical spine 1.  No cord lesion or abnormal enhancement. 2.  Mild degenerative changes at C3-C6 without stenosis or neural compression. Thoracic spine 1.  Negative. No cord lesion or abnormal enhancement. Report Verified by: Larey Dresser, MD at 10/25/2022 1:17 PM EDT     Medical Decision Making:  The following items were considered in medical decision making:  Obtain records and history from outside facility/provider  Review / order clinical lab tests  Review / order radiology tests  Review / order other diagnostic tests/interventions  Reviewed  outside records    I spent 42 minutes speaking with the patient, conducting an interview, performing a limited exam, and educating the patient on my assessment and plan. I also spent 18 minutes, on the same day as the encounter, preparing to see the patient (eg, review of tests), obtaining and/or reviewing separately obtained history, ordering medications, tests, or procedures, referring and communicating with other health care professionals , documenting clinical information in the electronic or other health record, independently interpreting results and communicating results to the patient/family/caregiver, providing care coordination , and performing non-face-to-face activities.     Electronically signed by:  Olene Craven, MD  11/27/2022 12:17 PM

## 2022-11-27 NOTE — Telephone Encounter (Signed)
No appts avail w/n 10 biz days for seizure. Please advise.

## 2022-11-28 ENCOUNTER — Ambulatory Visit: Payer: PRIVATE HEALTH INSURANCE | Attending: Neurology

## 2022-11-28 NOTE — Telephone Encounter (Signed)
[name] calling to report that the patient's neurologist told him that he find a neurologist who specializes in seizures. The Depakote was discontinued due to side effects and nothing else was prescribed. He has an EEG scheduled for 12/13/22 and wife would like to know should another seizure med be prescribed, should one be taken during the EEG? She was referred to PCP. Please advise.

## 2022-12-05 ENCOUNTER — Ambulatory Visit: Payer: PRIVATE HEALTH INSURANCE | Attending: Neurology

## 2022-12-08 ENCOUNTER — Ambulatory Visit: Payer: PRIVATE HEALTH INSURANCE | Attending: Neurology

## 2022-12-08 NOTE — Telephone Encounter (Signed)
MRN: [id number]   Specialty: NEUROLOGY    Patient Name: David Jordan     Patient Date of Birth: [birth year]     Relationship of Caller to Patient and CallbackDenton LankW[facility]) [phone]    Patient of: DR Hulda Marin of Call: COMPLAINTS OF SEIZURE TYPE EPISODE, NUMBNESS, HEADACHE, LOSS OF AWARENESS, FAST EYE MOVEMENTS, MUSCLE ARE TENSING UP. LAST EPISODE HAPPENED LAST NIGHT    On Call Provider Contacted: DRY RYAN    Time and Method of Contact: MOBILE@4 :55PM    Advise Caller: If provider does not call back within 30 minutes, please call us back.    ROUTE TELEPHONE NOTE - Follow Qgenda and/or Route Directly to Provider.    COPY this note into AFTERHOURS Teams chat.

## 2022-12-12 NOTE — Telephone Encounter (Signed)
I advised David Jordan to let them know that they will need to go to ER for urgent medical evaluation.

## 2022-12-13 ENCOUNTER — Inpatient Hospital Stay: Admit: 2022-12-13 | Discharge: 2022-12-27 | Payer: PRIVATE HEALTH INSURANCE | Attending: Neurology

## 2022-12-13 DIAGNOSIS — R4189 Other symptoms and signs involving cognitive functions and awareness: Secondary | ICD-10-CM

## 2022-12-13 DIAGNOSIS — G4089 Other seizures: Secondary | ICD-10-CM

## 2022-12-13 NOTE — Procedures (Signed)
AMBULATORY ELECTROENCEPHALOGRAM REPORT    Identifying Information:  Name: David Jordan  MRN: [id number]  DOB: [birth year]  Interpreting Physician: Ihor Gully, MD   Referring:  Dr. Winfred Burn      Clinical History:  David Jordan is an 37 y.o. male , who was evaluated on 12/13/2022 with a primary diagnosis of possible seizures.    Past Medical History:  Past Medical History:   Diagnosis Date    Allergy     Seasonal    Bilateral arm weakness     Headache     Memory loss     Migraines June 2024    Seizures (CMS-HCC) June 2024       Current Medications:   Current Outpatient Medications   Medication Sig    Qulipta Take 1 tablet (60 mg total) by mouth daily.    cyanocobalamin Take 1 tablet (1,000 mcg total) by mouth daily.    ibuprofen Take 1 tablet (200 mg total) by mouth every 6 hours as needed for Pain.     No current facility-administered medications for this encounter.       aEEG start date and time: 12/13/2022 1028  aEEG end date and time:  12/16/2022  1101    Technical Summary:  20 channels of EEG were recorded in a digital format over a 3-day period.  The patient kept a diary of normal activities and any abnormal events and was instructed to press the event button for any abnormal symptoms. Computerized spike and seizure detection was utilized during the entire period of monitoring.  All detections were reviewed and were consistent with artifact or normal physiologic activity.    This is an ambulatory EEG performed without video monitoring.    The background rhythm consisted of well-developed, well-regulated 10-11Hz  alpha activity, maximal over the posterior head regions and reactive to eye opening and closure.    No epileptiform abnormalities were seen.      PUSH-BUTTON EVENTS:  2. The patient pressed the button 2 times, the first as a test of the system at the time of hookup, and the second for symptoms consisting of chest pain and other symptoms without full body flush or full event. Review of the EEG  prior to, during and after the push button event revealed no changes and normal EEG during the events.      EEG Interpretation:   Seventy-two-hour ambulatory EEG monitoring is normal.    No epileptiform abnormalities or electrographic seizures were seen.    There was 1 push button event recorded with symptoms consisting of chest pain and other symptoms without full body flush or full event.  No EEG change was seen in association with these events, suggesting it is nonepileptic in nature.       Clinical correlation is recommended.

## 2022-12-15 ENCOUNTER — Inpatient Hospital Stay: Admit: 2022-12-18 | Discharge: 2022-12-27 | Payer: PRIVATE HEALTH INSURANCE | Attending: Neurology

## 2022-12-15 DIAGNOSIS — R4189 Other symptoms and signs involving cognitive functions and awareness: Secondary | ICD-10-CM

## 2022-12-16 ENCOUNTER — Inpatient Hospital Stay: Admit: 2022-12-16 | Payer: PRIVATE HEALTH INSURANCE | Attending: Neurology

## 2022-12-16 ENCOUNTER — Inpatient Hospital Stay: Admit: 2022-12-18 | Discharge: 2022-12-27 | Payer: PRIVATE HEALTH INSURANCE | Attending: Neurology

## 2022-12-22 NOTE — Addendum Note (Signed)
Addended by: Jerelene Redden on: 12/22/2022 01:31 PM     Modules accepted: Orders

## 2022-12-25 ENCOUNTER — Ambulatory Visit: Admit: 2022-12-25 | Discharge: 2022-12-29 | Payer: PRIVATE HEALTH INSURANCE | Attending: Neurology

## 2022-12-25 DIAGNOSIS — G4089 Other seizures: Secondary | ICD-10-CM

## 2022-12-25 DIAGNOSIS — G43E19 Chronic migraine with aura, intractable, without status migrainosus: Secondary | ICD-10-CM

## 2022-12-25 NOTE — Progress Notes (Signed)
Subjective     Patient ID: David Jordan is a 37 y.o. male.    HPI   Seizure/Spell description:   Feeling flushed, nauseated, can't speak clearly, clouded thinking like he is in a fog - can last minutes to hours    Current AEDs: None  Past AEDs: Depakote    MRI:  10/2022  Brain   1. Normal MRI brain without and with contrast.     MRV   1. Normal. No venous sinus thrombosis or stenosis.     Cervical spine   1.  No cord lesion or abnormal enhancement.   2.  Mild degenerative changes at C3-C6 without stenosis or neural compression.     Thoracic spine   1.  Negative. No cord lesion or abnormal enhancement.     EEG:  12/2022  EEG Interpretation:   Seventy-two-hour ambulatory EEG monitoring is normal.     No epileptiform abnormalities or electrographic seizures were seen.     There was 1 push button event recorded with symptoms consisting of chest pain and other symptoms without full body flush or full event.  No EEG change was seen in association with these events, suggesting it is nonepileptic in nature.      cvEEG 09/2022 24 hr  Normal. No events    Onset: 09/2022  Risk Factors: None  Driving:  Handedness: R  Initial HPI: 12/25/2022  David Jordan comes in with his wife for spells that started in June 2024 with no obvious trigger. He will suddenly feel flushed, nauseated and then confused. He may babble or speak incoherently. This would last for several minutes or a few hours. At onset it was occurring every 2-3 days. He went to the ED initially and a 24 hr EEG was normal with no events captured. He has had an extensive work up over the last 3 months including MRIs and a LP. All negative. He was given VPA for possible migraines and seizure coverage but he developed stomach pains from this. He further goes on to say he has developed persistent body numbness, primarily distally, that is occurring outside of these events. Over the last 1-2 months they are occurring at least weekly. He hasn't been back to work at [employer] since onset.          Histories:     He has a past medical history of Allergy, Bilateral arm weakness, Headache, Memory loss, Migraines (June 2024), and Seizures (CMS-HCC) (June 2024).    He has a past surgical history that includes Wisdom tooth extraction and pr eeg phys/qhp 2-12 hr with veeg (09/22/2022).    His family history includes Cancer in his maternal uncle; Diabetes in his father; Heart disease in his father.    He reports that he has never smoked. He has never used smokeless tobacco. He reports that he does not currently use alcohol. He reports that he does not use drugs.      Answers submitted by the patient for this visit:  Review of Systems (Submitted on 12/24/2022)  Activity Change: Yes  Appetite change: Yes  Chills: Yes  Diaphoresis (Excessive Sweating): Yes  Fatigue: Yes  Fever: No  Unexpected Weight Change: No  Facial Swelling: No  Neck Pain: No  Neck Stiffness: No  Ear Discharge: No  Hearing Loss: No  Ear Pain: No  Tinnitus (ringing in ears): No  Nosebleeds: No  Congestion: No  Rhinorrhea (Excessive discharge from the nose): No  Postnasal Drip: No  Sneezing: No  Sinus Pressure: No  Dental Problems: No  Drooling: No  Mouth Sores: No  Sore Throat: No  Trouble Swallowing: No  Voice Change: No  Eye Discharge: Yes  Eye Itching: No  Eye Pain: No  Eye Redness: No  Photophobia (Sensitivity to Light): No  Visual Disturbance: Yes  Apnea: No  Chest Tightness: Yes  Choking: No  Cough: No  Shortness of Breath: Yes  Stridor (High pitched wheezing): No  Wheezing: No  Chest Pain: Yes  Leg Swelling: No  Palpitations (Abnormal heartbeat): Yes  Abominal distention (Abdominal Swelling): No  Abdominal Pain: Yes  Anal Bleeding: No  Blood in Stool: No  Constipation: Yes  Diarrhea: No  Nausea: Yes  Rectal Pain: No  Vomiting: No  Cold Intolerance: No  Heat Intolerance: No  Polydipsia (excessive thirst): No  Polyphagia (increased appetite): No  Polyuria (frequent urination): Yes  Difficulty urinating: No  Dysuria (Pain with Urination):  No  Enuresis (Urinary incontinence): No  Flank pain (Pain on one side between upper abdomen and back): Yes  Frequency (urine): Yes  Genital sore: No  Hematuria (blood in urine): No  Urgency (urine): No  Urine decreased: No  Arthralgias (Joint pain): No  Back pain: No  Gait problem (Problems walking): No  Joint swelling: No  Myalgias (Muscle Pain): No  Color change: No  Pallor (Pale skin): No  Rash : No  Wound: No  Environmental allergies: Yes  Food Allergies : No  Immunocompromised: No  Dizziness: Yes  Facial asymmetry: No  Headaches : Yes  Light-headedness: Yes  Numbness: Yes  Seizures: Yes  Speech difficulty: No  Syncope (fainting): No  Tremors: No  Weakness: Yes  Adenopathy (swollen lymph): No  Bruises/bleeds easily: No  Agitation: Yes  Behavior problem: No  Confusion: Yes  Decreased concentration: Yes  Dysphoric mood (unpleasant mood): No  Hallucinations: No  Hyperactive: No  Nervous/anxious: Yes  Self-injury: No  Sleep disturbance: No  Suicidal ideas: No      Allergies:   Codeine and Shellfish derived    Medications:     Outpatient Encounter Medications as of 12/25/2022   Medication Sig Dispense Refill    atogepant (QULIPTA) 60 mg Tab Take 1 tablet (60 mg total) by mouth daily. 30 tablet 5    cyanocobalamin (VITAMIN B-12) 1000 MCG tablet Take 1 tablet (1,000 mcg total) by mouth daily. 30 tablet 0    ibuprofen (MOTRIN) 200 MG tablet Take 1 tablet (200 mg total) by mouth every 6 hours as needed for Pain.       No facility-administered encounter medications on file as of 12/25/2022.        Patient Reported Not Marked Taking Medications         Disp Refills Start End    ibuprofen (MOTRIN) 200 MG tablet -- --  --    Sig: Take 1 tablet (200 mg total) by mouth every 6 hours as needed for Pain.    Class: Historical Med    Route: Oral          Prescribed Not Marked Taking Medications         Disp Refills Start End    atogepant (QULIPTA) 60 mg Tab 30 tablet 5 10/16/2022 04/14/2023    Sig: Take 1 tablet (60 mg total) by mouth  daily.    Route: Oral    10/16/22 1449 - Original Entry by Galvin Proffer, MD  Authorizing Provider: Galvin Proffer, MD  Name:  atogepant (QULIPTA) 60 mg Tab Start date:  10/16/22 End date:  04/14/23 2359    Medication:  QULIPTA 60 MG TABLET [[id number]]    Dose:  60 mg Route:  Oral Frequency:  Daily    Sig: Take 1 tablet (60 mg total) by mouth daily.    Instructions:  --                Changes to the order are displayed in red.  Note that there may be changes made to the order that are not shown in this report, such as changes to details about ingredients.        cyanocobalamin (VITAMIN B-12) 1000 MCG tablet 30 tablet 0 10/25/2022 --    Sig: Take 1 tablet (1,000 mcg total) by mouth daily.    Route: Oral    10/25/22 1430 - Original Entry by Demetrius Charity, DO  Authorizing Provider: Demetrius Charity, DO               Name:  cyanocobalamin (VITAMIN B-12) 1000 MCG tablet Start date:  10/25/22 End date:  --    Medication:  CYANOCOBALAMIN (VIT B-12) 1,000 MCG TABLET [2009]    Dose:  1,000 mcg Route:  Oral Frequency:  Daily    Sig: Take 1 tablet (1,000 mcg total) by mouth daily.    Instructions:  --                Changes to the order are displayed in red.  Note that there may be changes made to the order that are not shown in this report, such as changes to details about ingredients.                            Objective     Constitutional: Oriented to person, place, and time. Appears well-developed and well-nourished.    Head: Normocephalic and atraumatic.   Eyes: EOM are normal. No scleral icterus.   Pulmonary/Chest: Effort normal.   Skin: No rash  Musculoskeletal: Exhibits no edema. ROM is normal  Neurological: Moves all ext equally. No tremor or ataxia.   Psychiatric: Speech is normal and behavior is normal. Judgment and thought content normal. No depression or anxiety noted         Assessment     ASSESSMENT :    1- Spells - while the events could be epileptic seizures, PNES cannot be ruled out.  His events are occurring at least 1 per week at this time.    PLAN:    Refer to EMU for spell capture  RTC in 2-3 months    I have reviewed and updated the history, physical exam, data, assessment, and plan of the note so it reflects the evaluation and management of the patient by on 12/25/2022    I personally reviewed the patient's old records, current laboratory and images as outlined above, and discussed the history with the patient and/or family.     Safety:   Uncontrolled epilepsy/seizures can cause memory loss, cognitive impairment, and severe injury including death.    Driving Risk: Having a seizure while driving puts you at risk for injuring yourself, or others. If a patient drives while having uncontrolled seizures, they may be held liable for injuries to others. Do not drive until they have been seizure free for at least 3 months.     Common Side Effects: Antiepileptic medications may cause somnolence, fatigue, dizziness, and cognitive side  effects. Some of the medications can rarely cause bone marrow failure and liver failure. Specific side effects of the antiepileptic medications that the patient is currently taking or that the patient will start taking were also discussed with the patient.     Mood change: The patient (caregiver, family) was (were) informed of the potential for an increase in the risk of mood change with antiepileptic drugs. They are asked to notify either me or other healthcare providers of any unusual behavioral changes or suicidal ideation.    Please be mindful of seizure precautions: NO SWIMMING, NO UNMONITORED BATHS, and, in general, no activities in which a sudden loss of consciousness could potentially cause severe injury (horseback riding, climbing trees, etc).    Should a seizure or seizure-like episode begin, please lay them on their side, DON'T place anything in their mouth, and someone should be timing it. Any seizure in which it lasts longer than 5 minutes, or a seizure  cluster longer than 5 minutes without complete recovery in between seizures, ANY seizure with breathing problems regardless of duration, and/or you need to give versed, these are all reasons why you need to go to the Emergency Room for assessment (call 911).

## 2022-12-28 ENCOUNTER — Emergency Department: Admit: 2022-12-29 | Payer: PRIVATE HEALTH INSURANCE

## 2022-12-28 DIAGNOSIS — R0789 Other chest pain: Secondary | ICD-10-CM

## 2022-12-28 DIAGNOSIS — R079 Chest pain, unspecified: Secondary | ICD-10-CM

## 2022-12-28 MED ORDER — famotidine (PEPCID) 20 MG tablet
20 | ORAL_TABLET | Freq: Two times a day (BID) | ORAL | 3 refills | Status: AC
Start: 2022-12-28 — End: ?

## 2022-12-28 NOTE — ED Triage Notes (Signed)
Patient with complaints of chest pain and abdominal pain. Was sent by nurse from insurance company for possible liver issues

## 2022-12-28 NOTE — Discharge Instructions (Signed)
Call [phone] to schedule your Holter Monitor.     Start taking famotidine to prevent stomach ulcers and help with abdominal pain.

## 2022-12-28 NOTE — ED Provider Notes (Signed)
[facility] ED Note    Date of Service: 12/28/2022    Reason for Visit: Chest Pain        Patient History     HPI:  37 y.o. male with a pmhx of spells starting in July, being worked up as an outpatient who presented to the ED with a chief complaint of chest pain.  Patient states for the last several months he has been seen neurology for workup for seizures.  Was recently told that he does not have seizures and was stopped from taking the seizure medicine that he had been on due to side effects.  States for the last 2 weeks he has had issues with increased lethargy and tiredness throughout the day, and intermittent diffuse chest and abdominal pain with no specific alleviating or exacerbating factors.  Is not currently having any pain.  Called and spoke with the nurse at the insurance company who recommended he come to the ER to get checked out and make sure he was not having any liver issues especially from the seizure medications that he was on..    ROS: A complete review of systems was obtained and is otherwise negative unless stated otherwise above in HPI.    Medical Hx        Past Medical History:   Diagnosis Date    Allergy     Seasonal    Bilateral arm weakness     Headache     Memory loss     Migraines June 2024    Seizures (CMS-HCC) June 2024       Past Surgical History:   Procedure Laterality Date    PR EEG PHYS/QHP 2-12 HR WITH VEEG  09/22/2022    WISDOM TOOTH EXTRACTION         David Jordan  reports that he has never smoked. He has never used smokeless tobacco. He reports that he does not currently use alcohol. He reports that he does not use drugs.    Previous Medications    ATOGEPANT (QULIPTA) 60 MG TAB    Take 1 tablet (60 mg total) by mouth daily.    CYANOCOBALAMIN (VITAMIN B-12) 1000 MCG TABLET    Take 1 tablet (1,000 mcg total) by mouth daily.    IBUPROFEN (MOTRIN) 200 MG TABLET    Take 1 tablet (200 mg total) by mouth every 6 hours as needed for Pain.       Allergies:   Allergies as  of 12/28/2022 - Fully Reviewed 12/28/2022   Allergen Reaction Noted    Codeine Hives and Nausea And Vomiting 01/20/2020    Shellfish derived Swelling and Rash 09/21/2022         Physical Exam     ED Triage Vitals [12/28/22 2126]   Vital Signs Group      Temp 97.4 F (36.3 C)      Core (Body) Temperature       Temp Source Oral      Heart Rate 96      Heart Rate Source Monitor      Resp 16      SpO2 100 %      BP 137/85      MAP (mmHg) 96      BP Method Automatic      BP Location Right upper arm      BP Cuff Size Regular      Patient Position Sitting   SpO2 100 %   O2 Device None (Room air)  General: This is a well-appearing male in no acute distress who appears their stated age.     HEENT: NC/AT. PERRL. OP clear. MMM.     Neck: Supple. Trachea midline. No lymphadenopathy.     Pulmonary: Normal work of breathing, not using accessory muscles or pursed lip breathing, lungs CTAB without wheezing, crackles, rales.    Cardiac: RRR -m/g/r.     Abdomen: Mild epigastric tenderness to palpation, otherwise nontender and nondistended     Musculoskeletal: WWP with no clubbing, cyanosis, or deformities noted. Baseline ROM, no TTP. No edema.     Vascular: +2 radial and DP pulses.     Skin: Warm and dry with no lesions noted - no petechiae, no purpura, no rashes. Non icteric.    Neuro:  AAOx4.  Face is grossly symmetric.  Normal gait.  Sensation intact.  Moving all 4 extremities equally and spontaneously, strength grossly symmetric.    Diagnostic Studies     Labs:   Please see electronic medical records for full details.     Radiology:   Please see electronic medical records for full details.      EKG:   indication: Chest pain.  Heart rate 88, intervals normal, axis normal.  No findings of ST elevation or depression, no T wave inversions.  Comparison to prior EKG with no changes.  EKG machine does read this is nonspecific ST/T wave abnormality, but again it is unchanged from prior EKG, and he is seeing a cardiologist in 2  months, they can follow-up on this.  Impression: Sinus rhythm with no active ischemia.    Emergency Department Procedures     none    ED Course and MDM     David Jordan is a 37 y.o. male who presented to the emergency department with Chest Pain  . The patient was evaluated by myself- the ED Attending Physician.     A full history and physical was performed, and appropriate labs and images were obtained as above. Nursing notes were reviewed.     Initial exam reveals a well-appearing male in no acute distress with normal vitals, afebrile.  Physical exam remarkable for normal exam including normal heart and lungs, slight epigastric tenderness to palpation but otherwise benign abdomen.    Patient currently has no symptoms, had screening labs performed for his chest and his abdomen which were unremarkable.  Results reviewed with the patient.  EKG reviewed.  Patient requesting phone number for Holter monitor which his primary care doctor also ordered for him, reordered Holter monitor and provided him with phone number to call.  Seen cardiology in 2 months who can follow-up on his nonspecific EKG changes that are unchanged from priors.  Patient provided with reassurance.  Based on epigastric tenderness will treat with famotidine.  Prescription sent to his pharmacy.  Discharged home in good condition.    Patient was treated with below medications with relief of their pain/nausea.     Medications - No data to display    Consults:  none     At this time the patient has been deemed safe for discharge. The patient is well-appearing, afebrile and hemodynamically stable. My customary discharge instructions including strict return precautions for worsening or new symptoms have been communicated. Risks, benefits, and alternatives were discussed with the patient who stated understanding and was agreeable.     Medical Decision Making  Problems Addressed:  Abdominal pain, unspecified abdominal location: complicated acute illness  or injury  Chest pain, unspecified type: complicated  acute illness or injury    Amount and/or Complexity of Data Reviewed  External Data Reviewed: labs, radiology, ECG and notes.     Details: Reviewed chart notes including primary care notes and neurology notes from the past few months  Labs: ordered. Decision-making details documented in ED Course.  Radiology: ordered. Decision-making details documented in ED Course.  ECG/medicine tests: ordered and independent interpretation performed. Decision-making details documented in ED Course.    Risk  OTC drugs.  Prescription drug management.          Impression     Chest pain  Abdominal pain    Plan     1) The patient is discharged to home in good condition.  2) The patient will be prescribed famotidine.   3) That patient is advised to follow up with PCP vs ED as needed, referred to cardiology/Holter monitor.    Allena Napoleon, MD  [facility]  Emergency Department         Hazle Quant, MD  12/28/22 2300

## 2022-12-28 NOTE — ED Notes (Signed)
Pt stable for d/c, educated on d/c instructions. Pt ambulated without difficulty upon d/c.

## 2022-12-29 ENCOUNTER — Inpatient Hospital Stay
Admit: 2022-12-29 | Discharge: 2022-12-29 | Disposition: A | Discharge status: Home / Self Care | Payer: PRIVATE HEALTH INSURANCE

## 2022-12-29 LAB — CBC
Hematocrit: 40.8 % (ref 38.5–50.0)
Hemoglobin: 14.4 g/dL (ref 13.2–17.1)
MCH: 29.8 pg (ref 27.0–33.0)
MCHC: 35.4 g/dL (ref 32.0–36.0)
MCV: 84.3 fL (ref 80.0–100.0)
MPV: 6.8 fL — ABNORMAL LOW (ref 7.5–11.5)
Platelets: 282 10*3/uL (ref 140–400)
RBC: 4.83 10*6/uL (ref 4.20–5.80)
RDW: 13.8 % (ref 11.0–15.0)
WBC: 5.7 10*3/uL (ref 3.8–10.8)

## 2022-12-29 LAB — HIGH SENSITIVITY TROPONIN: High Sensitivity Troponin: <3 ng/L (ref 0–20)

## 2022-12-29 LAB — DIFFERENTIAL
Basophils Absolute: 63 /uL (ref 0–200)
Basophils Relative: 1.1 % — ABNORMAL HIGH (ref 0.0–1.0)
Eosinophils Absolute: 57 /uL (ref 15–500)
Eosinophils Relative: 1 % (ref 0.0–8.0)
Lymphocytes Absolute: 1870 /uL (ref 850–3900)
Lymphocytes Relative: 32.8 % (ref 15.0–45.0)
Monocytes Absolute: 513 /uL (ref 200–950)
Monocytes Relative: 9 % (ref 0.0–12.0)
Neutrophils Absolute: 3198 /uL (ref 1500–7800)
Neutrophils Relative: 56.1 % (ref 40.0–80.0)
nRBC: 0 /100{WBCs} (ref 0–0)

## 2022-12-29 LAB — HEPATIC FUNCTION PANEL
ALT: 21 U/L (ref 7–52)
AST: 17 U/L (ref 13–39)
Albumin: 4.9 g/dL (ref 3.5–5.7)
Alkaline Phosphatase: 58 U/L (ref 36–125)
Bilirubin, Direct: 0.2 mg/dL (ref 0.0–0.4)
Bilirubin, Indirect: 1 mg/dL (ref 0.0–1.1)
Total Bilirubin: 1.2 mg/dL (ref 0.0–1.5)
Total Protein: 8 g/dL (ref 6.4–8.9)

## 2022-12-29 LAB — BASIC METABOLIC PANEL
Anion Gap: 12 mmol/L (ref 3–16)
BUN: 8 mg/dL (ref 7–25)
CO2: 27 mmol/L (ref 21–33)
Calcium: 9.6 mg/dL (ref 8.6–10.3)
Chloride: 100 mmol/L (ref 98–110)
Creatinine: 0.92 mg/dL (ref 0.60–1.30)
EGFR: >90
Glucose: 101 mg/dL — ABNORMAL HIGH (ref 70–100)
Osmolality, Calculated: 286 mosm/kg (ref 278–305)
Potassium: 3.8 mmol/L (ref 3.5–5.3)
Sodium: 139 mmol/L (ref 133–146)

## 2022-12-29 LAB — LIPASE: Lipase: 8 U/L (ref 4–82)

## 2022-12-29 LAB — B NATRIURETIC PEPTIDE: BNP: 11 pg/mL (ref 0–100)

## 2022-12-29 NOTE — Telephone Encounter (Signed)
Patient's wife called, patient was in the hospital 9/26 and was advised to see a hematologist. Does he need a HDF visit, as this is due to the same health issues he has been discussing with David Jordan?     Patient would like a phone call or MyChart with the answer.    Thank you

## 2022-12-29 NOTE — Addendum Note (Signed)
Addended by: Jerelene Redden on: 12/29/2022 01:35 PM     Modules accepted: Orders

## 2023-01-04 NOTE — Addendum Note (Signed)
Addended by: Jerelene Redden on: 01/04/2023 03:58 PM     Modules accepted: Orders

## 2023-01-16 ENCOUNTER — Ambulatory Visit: Payer: PRIVATE HEALTH INSURANCE | Attending: Neurology

## 2023-01-18 NOTE — Telephone Encounter (Signed)
Patients wife called to set up Holter monitor through [employer]. Patient wife would like monitor shipped directly to their home. Address was verified and monitor was boarded and enrolled. Informed patient company is going to call them one more time to verify their address again before shipping monitor. Gave them the numbers [phone] and [phone] that may call them to verify.

## 2023-01-23 ENCOUNTER — Ambulatory Visit: Payer: PRIVATE HEALTH INSURANCE | Attending: Family

## 2023-01-24 ENCOUNTER — Ambulatory Visit: Admit: 2023-01-24 | Discharge: 2023-01-28 | Payer: PRIVATE HEALTH INSURANCE

## 2023-01-24 DIAGNOSIS — R5383 Other fatigue: Secondary | ICD-10-CM

## 2023-01-24 NOTE — Progress Notes (Signed)
[facility] Hematology Oncology  [facility]  Benign Hematology Clinic    PATIENT DETAILS  Legal Name: David Jordan  Preferred Name: [name]    AGE/SEX: 37 y.o.,male  DOB: 1986-03-05  MRN: [id number]    Referring Provider     Venita Sheffield, PA  [address]  [place],  [place] [address]    Primary Care Provider     Venita Sheffield, [place]    Chief Complaint / Reason for Follow-Up     The principal reason for today's visit is fatigue and unexplained weight loss.    Brief Summary of Disease & Interval Updates     David Jordan is a 37 y.o. male who is being seen in the hematology clinic for fatigue and unexplained weight loss.Patient has had extensive work-up to rule out seizures. MRI brain, cervical, and thoracic spine were negative. EEG was negative. Lumbar puncture was negative. Reports episodes began in June and consisted of him feeling nauseous, confused, and flushed with him speaking incoherently. Each episode would last anywhere from several minute to several hours. Episodes occur as often as every 2-3 days. He is scheduled to be admitted to the EMU on 02/05/23.    Today Cristal Deer reports with his wife who is also able to recollect that these episodes began on day in June and have just continued to occur since. Patient spends most of his day sleeping. Is unable to tell when an episode is going to occur. Also complains of constant lower diffuse abdominal pain that has caused decreased appetite and nausea.     Histories     Past Medical History:   Diagnosis Date    Allergy     Seasonal    Bilateral arm weakness     Headache     Memory loss     Migraines June 2024    Seizures (CMS-HCC) June 2024       Past Surgical History:   Procedure Laterality Date    PR EEG PHYS/QHP 2-12 HR WITH VEEG  09/22/2022    WISDOM TOOTH EXTRACTION         Family History   Problem Relation Age of Onset    Diabetes Father     Heart disease Father     Cancer  Maternal Uncle         Colorectal, as adult over 57. Still alive    Kidney disease Neg Hx     Stroke Neg Hx        Social History     Occupational History    Not on file   Tobacco Use    Smoking status: Never    Smokeless tobacco: Never   Vaping Use    Vaping status: Never Used   Substance and Sexual Activity    Alcohol use: Not Currently    Drug use: Never    Sexual activity: Yes     Partners: Female     Birth control/protection: I.U.D.       The following portions of the patient's history were reviewed and updated as appropriate: allergies, current medications, past family history, past medical history, past social history, past surgical history and problem list.    Current Outpatient Medications     Current Outpatient Medications   Medication Sig    Qulipta Take 1 tablet (60 mg total) by mouth daily.    cyanocobalamin Take 1 tablet (1,000 mcg total) by mouth daily.    famotidine  Take 1 tablet (20 mg total) by mouth 2 times a day.    ibuprofen Take 1 tablet (200 mg total) by mouth every 6 hours as needed for Pain.     No current facility-administered medications for this visit.       Review of Systems     Review of Systems   Constitutional:  Positive for malaise/fatigue and weight loss. Negative for chills and fever.   HENT:  Negative for nosebleeds.    Respiratory:  Negative for shortness of breath.    Cardiovascular:  Negative for chest pain, palpitations and leg swelling.   Gastrointestinal:  Positive for abdominal pain, nausea and vomiting. Negative for blood in stool.   Genitourinary:  Negative for hematuria.   Neurological:  Positive for seizures.        Seizure-like episodes reported         Vital Signs     Blood pressure 139/85, pulse 76, temperature 98.9 F (37.2 C), temperature source Temporal, resp. rate 16, height 5' 9 (1.753 m), weight 139 lb 12.8 oz (63.4 kg), SpO2 100%.     Physical Exam     Physical Exam  Constitutional:       General: He is not in acute distress.     Appearance: Normal  appearance. He is not ill-appearing.   HENT:      Head: Normocephalic and atraumatic.   Eyes:      Extraocular Movements: Extraocular movements intact.      Pupils: Pupils are equal, round, and reactive to light.   Cardiovascular:      Rate and Rhythm: Normal rate and regular rhythm.      Pulses: Normal pulses.      Heart sounds: Normal heart sounds.   Pulmonary:      Effort: Pulmonary effort is normal.      Breath sounds: Normal breath sounds.   Abdominal:      General: Bowel sounds are normal.      Palpations: Abdomen is soft.   Musculoskeletal:         General: Normal range of motion.      Right lower leg: No edema.      Left lower leg: No edema.   Skin:     General: Skin is warm and dry.   Neurological:      General: No focal deficit present.      Mental Status: He is alert and oriented to person, place, and time. Mental status is at baseline.   Psychiatric:         Mood and Affect: Mood normal.         Behavior: Behavior normal.         Thought Content: Thought content normal.           Laboratory Data     Complete Blood Count (most recent)  Lab Results   Component Value Date    WBC 5.7 12/28/2022    HGB 14.4 12/28/2022    HCT 40.8 12/28/2022    MCV 84.3 12/28/2022    PLT 282 12/28/2022     Renal Panel  Lab Results   Component Value Date    NA 139 12/28/2022    K 3.8 12/28/2022    CL 100 12/28/2022    CO2 27 12/28/2022    ANIONGAP 12 12/28/2022    BUN 8 12/28/2022    CREATININE 0.92 12/28/2022    EGFRAACKDEPI >90 02/04/2020    EGFRNONACKDE >90 02/04/2020    GLUCOSE 101 (  H) 12/28/2022    CALCIUM 9.6 12/28/2022    PHOS 4.2 10/22/2022    MG 2.0 10/22/2022     Liver Panel  Lab Results   Component Value Date    BILITOT 1.2 12/28/2022    BILIDIRECT 0.2 12/28/2022    AST 17 12/28/2022    ALT 21 12/28/2022    ALKPHOS 58 12/28/2022    PROT 8.0 12/28/2022    ALBUMIN 4.9 12/28/2022       Assessment & Plan     Bradshaw Yanik is a 37 y.o. male seen for fatigue and unexplained weight loss    PROBLEMS  ADDRESSED  #Fatigue  #Weight loss    PLAN SUMMARY  - Discussed with patient that flow was performed and was negative for dyscrasias, blast, etc  - Would not perform bone marrow biopsy as not abnormalities on labs to warrant biopsy  - Discussed that this does not sound hematological in nature  - Advised patient discuss receiving cardiology clearance for EGD/colonoscopy when he is admitted for EMU  - Chris verbalized understanding and agreed with plan    RTC: PRN      Signed:  Windell Hummingbird, CNP    I spent 60 minutes on the day of patient's visit reviewing patient chart in addition to seeing patient during visit. In reviewing the patient's chart time was taken to review past notes, lab values, imaging, and treatment plans. Time was taken during the visit to educate the patient as well as review labs and answer questions and concerns.       Medical Decision Making:  The following items were considered in medical decision making:  Obtain records and history from outside facility/provider  Review / order clinical lab tests  Review / order radiology tests  Review / order other diagnostic tests/interventions  Reviewed outside records  Medication toxicity monitoring performed  Discuss with Attending Physician       Number and Complexity of Problems (Level 5)  During this encounter, I addressed 1 or more chronic illnesses with severe exacerbation, progression, or side effects of treatment and 1 undiagnosed new problem with uncertain prognosis      Amount and/or Complexity of Data Ordered, Reviewed, or Analyzed (Level 4)  I reviewed 3+ external note(s) from:.  I reviewed 3+ test(s) including:.      Risk of Complication and/or Morbidity or Mortality of Patient Management (Level  4)  The patient's management entails Moderate risk of complications and/or morbidity or mortality.    Overall LOS:  4

## 2023-01-31 ENCOUNTER — Inpatient Hospital Stay: Admit: 2023-01-31 | Payer: PRIVATE HEALTH INSURANCE

## 2023-01-31 DIAGNOSIS — R079 Chest pain, unspecified: Secondary | ICD-10-CM

## 2023-01-31 NOTE — Procedures (Incomplete)
HOLTER AMBULATORY ELECTROCARDIOGRAM:    Ordering Physician:  A. Maryjean Ka, MD  Technician:  Marylu Lund      Indications:  Chest Pain      From: 01/22/2023  To: 01/29/2023   Total Days: 7    Quality of Tracing:  good     Basic Rhythm:  sinus       Maximum Heart Rate:  149  bpm     Minimum Heart Rate:  43 bpm Average Heart Rate:  70 bpm    PVC's:  0  PVC Burden:  0%    ARRHYTHMIAS:    Ventricular Tachycardia:  none    SVT:  none    PAC's:  0    Symptoms:  There were 27 patient triggered events.  Patient reported palpitations, chest discomfort, light headedness, and fatigue    CONCLUSION:    PRELIMINARY/INCOMPLETE RESULTS; AWAITING PHYSICIAN INTERPRETATION      Interpreting MD:  Burnard Leigh, MD

## 2023-02-01 MED ORDER — ondansetron (ZOFRAN) 4 MG tablet
4 | ORAL_TABLET | Freq: Three times a day (TID) | ORAL | 0 refills | PRN
Start: 2023-02-01 — End: 2023-03-03

## 2023-02-01 MED ORDER — naproxen (NAPROSYN) 500 MG tablet
500 | ORAL_TABLET | Freq: Two times a day (BID) | ORAL | 0 refills
Start: 2023-02-01 — End: 2023-03-03

## 2023-02-01 NOTE — Addendum Note (Signed)
Addended by: Jerelene Redden on: 02/01/2023 04:02 PM     Modules accepted: Orders

## 2023-02-05 ENCOUNTER — Inpatient Hospital Stay: Payer: PRIVATE HEALTH INSURANCE

## 2023-02-22 ENCOUNTER — Ambulatory Visit: Admit: 2023-02-22 | Discharge: 2023-02-26 | Payer: PRIVATE HEALTH INSURANCE

## 2023-02-22 DIAGNOSIS — R404 Transient alteration of awareness: Secondary | ICD-10-CM

## 2023-02-22 DIAGNOSIS — R072 Precordial pain: Secondary | ICD-10-CM

## 2023-02-22 NOTE — H&P (View-Only) (Signed)
[facility] HEART & VASCULAR   HEART FAILURE / CARDIOLOGY NEW PATIENT VISIT      David Jordan  [id number]  02/22/2023  9:50 AM    HPI:           David Jordan is a 38 y.o. White or Caucasianmale who presents today as a new patient. Presents with episodes 3-4 times a week which start with flushing, than nausea,tham chest and headache. Wife noticed jerky eye movements. BP checked during it is mildly high 145 /80 with pulse of 79. After the episode the BP was 117/68. Current symptoms include: chest pain, dyspnea, fatigue, near-syncope, and palpitations. He denies syncope. He states he is compliant most of the time with his medications. He states he is compliant most of the time with his diet.      REVIEW OF SYSTEMS:     General ROS: all negative  Psychological ROS: all negative  Ophthalmic ROS: negative  ENT ROS: all negative  Allergy and Immunology ROS: negative  Hematological and Lymphatic ROS: all negative  Endocrine ROS: all  negative  Respiratory ROS: all negative  Cardiovascular ROS: all negative  Gastrointestinal ROS: all negative  Genito-Urinary ROS: no dysuria, trouble voiding, or hematuria  Musculoskeletal ROS: all negative  Neurological ROS: all negative  Dermatological ROS: all negative    PMH:     Past Medical History:   Diagnosis Date    Allergy     Seasonal    Bilateral arm weakness     Headache     Memory loss     Migraines June 2024    Seizures (CMS-HCC) June 2024       Past Surgical History    Past Surgical History:   Procedure Laterality Date    PR EEG PHYS/QHP 2-12 HR WITH VEEG  09/22/2022    WISDOM TOOTH EXTRACTION         SOCIAL HISTORY:    Social History     Socioeconomic History    Marital status: Married     Spouse name: Not on file    Number of children: Not on file    Years of education: Not on file    Highest education level: Not on file   Occupational History    Not on file   Tobacco Use    Smoking status: Never    Smokeless tobacco: Never   Vaping Use    Vaping status: Never Used    Substance and Sexual Activity    Alcohol use: Not Currently    Drug use: Never    Sexual activity: Yes     Partners: Female     Birth control/protection: I.U.D.   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    Seat Belt Yes   Social History Narrative    Not on file     Social Drivers of Health     Financial Resource Strain: Not on file   Food Insecurity: No Food Insecurity (10/21/2022)    Hunger Vital Sign     Worried About Running Out of Food in the Last Year: Never true     Ran Out of Food in the Last Year: Never true   Transportation Needs: No Transportation Needs (10/21/2022)    PRAPARE - Transportation  Lack of Transportation (Medical): No     Lack of Transportation (Non-Medical): No   Physical Activity: Not on file   Stress: Not on file   Social Connections: Not on file   Intimate Partner Violence: Not At Risk (10/21/2022)    Humiliation, Afraid, Rape, and Kick questionnaire     Fear of Current or Ex-Partner: No     Emotionally Abused: No     Physically Abused: No     Sexually Abused: No   Housing Stability: Low Risk  (10/21/2022)    Housing Stability Vital Sign     Unable to Pay for Housing in the Last Year: No     Number of Times Moved in the Last Year: 0     Homeless in the Last Year: No           MEDICATIONS:     Current Outpatient Medications on File Prior to Visit   Medication Sig Dispense Refill    cyanocobalamin (VITAMIN B-12) 1000 MCG tablet Take 1 tablet (1,000 mcg total) by mouth daily. 30 tablet 0    famotidine (PEPCID) 20 MG tablet Take 1 tablet (20 mg total) by mouth 2 times a day. 60 tablet 3    ibuprofen (MOTRIN) 200 MG tablet Take 1 tablet (200 mg total) by mouth every 6 hours as needed for Pain.      atogepant (QULIPTA) 60 mg Tab Take 1 tablet (60 mg total) by mouth daily. (Patient not taking: Reported on 02/22/2023) 30 tablet 5     naproxen (NAPROSYN) 500 MG tablet Take 1 tablet (500 mg total) by mouth 2 times a day with meals. (Patient not taking: Reported on 02/22/2023) 60 tablet 0    ondansetron (ZOFRAN) 4 MG tablet Take 1 tablet (4 mg total) by mouth every 8 hours as needed for Nausea. (Patient not taking: Reported on 02/22/2023) 20 tablet 0     No current facility-administered medications on file prior to visit.             ALLERGIES    Allergies   Allergen Reactions    Codeine Hives and Nausea And Vomiting     Hives and Fever    Shellfish Derived Swelling and Rash           PHYSICAL EXAMINATION:     Vitals:    02/22/23 0940   BP: 140/90   Pulse: 91   SpO2: 99%     Wt Readings from Last 3 Encounters:   01/24/23 139 lb 12.8 oz (63.4 kg)   12/28/22 132 lb (59.9 kg)   12/25/22 132 lb (59.9 kg)         Constitutional:alert, no acute distress, well hydrated, appropriate for age.   Skin: normal turgor.  Head: atraumatic, normocephalic.   Eyes: no scleral injection.  Neck: supple.  Abdomen: normal BS  Neuro: non focal  Psych: affect and mood appropriate.   Pulmonary  Right Upper:  normal air entry, lungs clear to auscultation  no rales, rhonchi or wheezing  Left Upper: normal air entry, lungs clear to auscultation  no rales, rhonchi or wheezing  Right Lower: normal air entry, lungs clear to auscultation  no rales, rhonchi or wheezing  Left Lower: normal air entry, lungs clear to auscultation  no rales, rhonchi or wheezing  Cardiovascular  Neck Veins: No distension  JVP: not raised.  Palpation: apical impulse displaced  Location Apical Impulse: 5th intercostal space displaced laterally  S1: normal  S2: normal  Rhythm: regular rate  and rhythm  Bilateral Lower Extremity Edema: none  Pulses poorly palpable      LABS:       Lab Results   Component Value Date    WBC 5.7 12/28/2022    HGB 14.4 12/28/2022    HCT 40.8 12/28/2022    PLT 282 12/28/2022    TRIG 81 09/22/2022    HDL 46 (L) 09/22/2022    ALT 21 12/28/2022    AST 17 12/28/2022    NA 139  12/28/2022    K 3.8 12/28/2022    CL 100 12/28/2022    CREATININE 0.92 12/28/2022    BUN 8 12/28/2022    CO2 27 12/28/2022    TSH 1.73 11/09/2022    HGBA1C 5.1 10/22/2022       Last Echocardiogram report:    Results for orders placed during the hospital encounter of 09/21/22    Echo 2D Complete (TTE)    Narrative  * Deborah [facility]  Department of Cardiology*  [address]  [place], [place] [address]  [phone]    Transthoracic Echocardiogram    Patient:     David Jordan, David Jordan      Room:   134        Height: 69in  MR Number:   [id number]                DOB:    [birth year] Weight: 145lb  Account:     [id number]              Gender: M          BP:     114 / 70  Study Date:  09/21/2022              Age:    36         BSA:    1.79m^2  Referring physician:    Shari Heritage  Interpreting physician: David Jordan    PERFORMING   [facility], [facility]  SONOGRAPHER  Tiana Loft RDCS  ORDERING     David Jordan, David Jordan  REFERRING    David Jordan, David Jordan  CONSULTING   David Jordan, David Jordan  ATTENDING    Donnetta Hutching    ----------------------------------------------------------------------------    Procedure:ECHO 2D COMPLETE (TTE)   Order: Accession Number:[id number]    ----------------------------------------------------------------------------  Indications:      (syncope R55).    ----------------------------------------------------------------------------  PMH:  No prior cardiac history.    ----------------------------------------------------------------------------  Study data:  Height: 69in. 175.3cm. Weight: 145lb. 65.8kg.  Study status:  Routine.  Procedure:  A transthoracic echocardiogram was performed. Image  quality was good. Scanning was performed from the parasternal, apical, and  subcostal acoustic windows.          Transthoracic echocardiogram.  M-mode,  complete 2D, complete spectral Doppler, and color Doppler.  Birthdate:  Patient birthdate: [birth year].  Age:  Patient is  18year(s) old.  Sex:  Birth  gender: male.  Body mass index:  BMI: 21.4kg/m^2.  Body surface area:  BSA: 1.77m^2.  Blood pressure:     114/70  Patient status:  Inpatient.  Study  date:  Study date: 09/21/2022. Study time: 01:39 PM.  Location:  Echo  laboratory.    ----------------------------------------------------------------------------  Study Conclusions    - Left ventricle: The cavity size is normal. Wall thickness is normal.  Systolic function is normal. The estimated ejection fraction is 55-60%.  Wall motion is normal; there  are no regional wall motion abnormalities.  Left ventricular diastolic function parameters are normal.  - Aortic valve: There is mild thickening.  - Mitral valve: The annulus is mildly calcified.  - Right ventricle: Systolic function is normal by objective interpretation.    ----------------------------------------------------------------------------  Cardiac Anatomy    Left ventricle:    - The cavity size is normal. Wall thickness is normal. Systolic function is  normal. The estimated ejection fraction is 55-60%. Wall motion is normal;  there are no regional wall motion abnormalities.  - The transmitral flow pattern is normal. The deceleration time of the early  transmitral flow velocity is normal. The pulmonary vein flow pattern is  normal. The tissue Doppler parameters are normal. Left ventricular  diastolic function parameters are normal.  Aorta:  Aortic root: The root is normal in size.  Aortic valve:    - TrileafletThe leaflets are normal thickness. There is mild thickening.  Mobility is not restricted. Velocity is within the normal range. There is  no stenosis. There is no regurgitation. The mean systolic gradient is 4mm  Hg. The ratio of LVOT to aortic valve peak velocity is 0.8. The ratio of  LVOT to aortic valve mean velocity is 0.8.  Mitral valve:    - The annulus is mildly calcified. Mobility is not restricted. Inflow  velocity is within the normal range. There is no evidence  for stenosis.  There is no regurgitation. The mean diastolic gradient is 2mm Hg. The peak  diastolic gradient is 3mm Hg.  Left atrium:  The atrium is normal in size.  Pulmonary artery:    - The main pulmonary artery is normal-sized. Systolic pressure was within  the normal range.  Main pulmonary artery:    -  Right ventricle:    - The cavity size is normal. Wall thickness is normal. Systolic function is  normal by objective interpretation.  Pulmonic valve:    - Velocity is within the normal range. There is no evidence for stenosis.  There is no regurgitation.  Tricuspid valve:    - The valve is structurally normal. Inflow velocity is within the normal  range. There is no regurgitation.  Right atrium:  The atrium is normal in size.  Pericardium:    - There is no pericardial effusion.  Systemic veins:  Inferior vena cava: The IVC is normal-sized.    ----------------------------------------------------------------------------  Measurements    Left ventricle            Value          Ref  EDD, LAX              (N) 4.5   cm       4.2 - 5.8  ESD, LAX              (N) 2.6   cm       2.5 - 4.0  EDD/bsa, LAX          (N) 2.5   cm/m^2   2.2 - 3.0  ESD/bsa, LAX          (N) 1.4   cm/m^2   1.3 - 2.1  FS, LAX               (N) 42    %        25 - 43  FS, LAX chord         (N) 42    %        25 -  43  IVS, ED               (N) 0.8   cm       0.6 - 1.0  ESD                   (N) 2.6   cm       2.5 - 4.0  ESD/bsa               (N) 1.4   cm/m^2   1.3 - 2.1  PW, ED                (N) 0.9   cm       0.6 - 1.0  IVS/PW, ED                0.89           ---------  EDV                   (N) 92    ml       62 - 150  ESV                   (N) 25    ml       21 - 61  EF                    (H) 73    %        52 - 72  EDV/bsa               (N) 51    ml/m^2   34 - 74  ESV/bsa               (N) 14    ml/m^2   11 - 31  SV, 1-p A2C               68    ml       ---------  SV/bsa, 1-p A2C           37.7  ml/m^2   ---------  SV, 1-p A4C                74    ml       ---------  SV/bsa, 1-p A4C           41    ml/m^2   ---------  E', lat ann, TDI      (N) 19.3  cm/sec   >=10.0  E/e', lat ann, TDI    (N) 5              <=13  E', med ann, TDI      (N) 12.0  cm/sec   >=7.0  E/e', med ann, TDI        8              ---------  E', avg, TDI              15.7  cm/sec   ---------  E/e', avg, TDI        (N) 6              <=14    LVOT                      Value          Ref  Peak vel, S  1.09  m/sec    ---------  Mean vel, S               0.81  m/sec    ---------    Right ventricle           Value          Ref  EDD, LAX                  2.9   cm       ---------  TAPSE, MM             (N) 3.3   cm       >=1.7  S' lateral            (N) 19.9  cm/sec   >=9.5    Left atrium               Value          Ref  AP dim, ES            (N) 3.0   cm       3.0 - 4.0  AP dim index, ES      (N) 1.7   cm/m^2   1.5 - 2.3  Area ES, A4C          (N) 8     cm^2     <=20  Area/bsa ES, A4C          4.26  cm^2/m^2 ---------  SI dim, A2C               4.4   cm       ---------  Vol, S                (N) 20    ml       18 - 58  Vol/bsa, S            (L) 11    ml/m^2   16 - 34  Vol, ES, 1-p A4C      (L) 12    ml       18 - 58  Vol/bsa, ES, 1-p A4C  (L) 6     ml/m^2   12 - 37  Vol, ES, 1-p A2C      (N) 31    ml       18 - 58  Vol/bsa, ES, 1-p A2C  (N) 17    ml/m^2   11 - 43  Vol, ES, 2-p              20    ml       ---------  Vol/bsa, ES, 2-p      (L) 11    ml/m^2   16 - 34    Right atrium              Value          Ref  Area, ES, A4C         (N) 10    cm^2     10 - 18    Aortic valve              Value          Ref  Peak v, S                 1.4   m/sec    ---------  Mean v, S  1.01  m/sec    ---------  Mean grad, S              4     mm Hg    ---------  LVOT/AV, Vpeak ratio      0.8            ---------  LVOT/AV, Vmean ratio      0.8            ---------    Mitral valve              Value          Ref  Mean v, D                 0.65  m/sec    ---------  Peak E                     0.93  m/sec    ---------  Peak A                    0.61  m/sec    ---------  VTI leaflet coapt         25.2  cm       ---------  Decel time                183   ms       ---------  Mean grad, D              2     mm Hg    ---------  Peak grad, D              3     mm Hg    ---------  Peak E/A ratio            1.5            ---------  E-VTI                     25.2  cm       ---------  A-VTI                     25.2  cm       ---------  VTI E/A                   1.0            ---------  Ann VTI                   25.2  cm       ---------    Pulmonic valve            Value          Ref  Peak v, S                 1.2   m/sec    ---------  Mean vel, S               0.84  m/sec    ---------  Accel time                165   ms       ---------    Aortic root               Value          Ref  Root diam             (  N) 3.0   cm       2.5 - 3.5  Root diam/bsa             1.7   cm/m^2   ---------    Main pulmonary artery     Value          Ref  Mean grad                 3     mm Hg    ---------    Inferior vena cava        Value          Ref  Diam                  (N) 1.3   cm       <=2.1  Legend:  (L)  and  (H)  mark values outside specified reference range.    (N)  marks values inside specified reference range.    Reviewed and confirmed by    David Jordan  2024-06-20T14:51:38      From: 01/22/2023       To: 01/29/2023                       Total Days: 7     Quality of Tracing:  good      Basic Rhythm:  sinus        Maximum Heart Rate:  149  bpm     Minimum Heart Rate:  43 bpm         Average Heart Rate:  70 bpm     PVC's:  0          PVC Burden:  0%     ARRHYTHMIAS:     Ventricular Tachycardia:  none     SVT:  none     PAC's:  0     Symptoms:  There were 27 patient triggered events.       Patient reported palpitations, chest discomfort, light headedness, and fatigue     CONCLUSION:     Baseline NSR  No sustained arrhythmia  Rare PAC PVCs.  All symptoms associated with normal sinus rhythm and sinus tachycardia.  Few  patient triggered events.    Interpreting MD:  David Leigh, MD    Orthostatic blood pressures in the office were negative for orthostasis or tachycardia.      ASSESMENT:               David Jordan is a 37 y.o. White or Caucasian male who presents as a new patient to our clinic today.    Patient's episodes of flushing followed by nausea and presyncope.  He also noted to have hypertension and high heart rate during these episodes.      PLAN:     Will do a workup for pheochromocytoma.  In addition we will do a tilt table test to evaluate for vasodepressive syncope.  Return to clinic in 3 months.        Patient care, labs, test results discussed in detail with the patient during the visit.        David Jordan M.D. [facility]  Professor of Medicine.   Medical director, [facility]  Division of cardiovascular disease  [facility]  [facility].   02/22/2023    Medical Decision Making:   The following items were considered in medical decision making:   Review medicines.  Review / order clinical lab tests   Review / order radiology tests /echocardiogram   Review outside records.  Review hospitalizations.  Review / order other diagnostic tests/interventions.      This note was completely edited, written and reviewed by me and consists of information cut and pasted from the my most recent visit, my smart phrases and other Epic tools. I have personally reviewed all aspects of this note to at least include reviewing this patient's chart and problem list, updating the history, physical exam, lab and procedure results, and assessment and plan as detailed above and below.  As such this visit note reflects my current evaluation and management for this patient.              Answers submitted by the patient for this visit:  Cardiology and Heart Failure ROS Questionnaire (Submitted on 02/20/2023)  Appetite Loss: Yes  Chills: Yes  Excessive sweating: No  Fever: No  Fatigue: Yes  Night  sweats: Yes  Weight gain: No  Weight loss: Yes  Blurred vision: Yes  Discharge: Yes  Double vision: No  Eye pain : No  Photophobia/Light sensitive: No  Redness: No  Left eye vision loss: No  Right eye vision loss: No  Visual disturbances (i.e. blurred vision): Yes  Visual halos: No  Cough: No  Coughing blood: No  Shortness of breath: No  Sleep disturbances caused by breathing complications: No  Snoring: No  Production of sputum: No  Wheezing: No  Changes in nail beds: No  Skin discoloration: No  Skin dryness: No  Skin flushing: Yes  Itching: Yes  Poor wound healing: Yes  Rash: Yes  Skin cancer: No  Lesions: No  Unusual hair distribution: No  Congestion: No  Ear discharge: No  Ear pain: No  Hearing loss: No  Hoarseness: No  Nosebleeds: No  Painful to swallow: No  Sore throat: No  Stridor: No  Tinnitus (ringing in ear): No  Chest pain: Yes  Pain in your leg that occurs when you walk or exercise.: Yes  Bluish/grayish skin discoloration of mucous membranes: No  Unable to catch your breath during physical activity.: No  Irregular heartbeats: Yes  Leg swelling: No  Near-fainting: Yes  Shortness of breath while laying down. : No  Palpitations: Yes  Waking up in the night due to shortness of breath. : No  Fainting or loss of consciousness: No  Intolerance of cold : No  Intolerance of heat: No  Excessive thirst or drinking of fluids.: No  Excessive hunger: No  Excessive urination: No  Adenopathy (swollen lymph nodes): No  Bleeding: No  Prone to bruising and bleeding easily : No  Arthritis: No  Back pain: Yes  Falls: No  Gout: No  Joint pain: No  Joint swelling: No  Muscle cramps: No  Muscle weakness: No  Myalgia (muscle pain/soreness): No  Neck pain: Yes  Stiffness: No  Abdominal bloating: No  Abdominal pain: Yes  Anorexia: No  Changes in bowel habit: Yes  Bowel incontinence: No  Constipation: Yes  Diarrhea: No  Difficulty swallowing food or liquid: No  Excessive appetite: No  Flatus (gas): No  Heartburn: No  Vomiting blood  : No  Blood in stool: No  Hemorrhoids: No  Jaundice: No  Black tarry stool: No  Nausea: Yes  Vomiting: No  Bladder incontinence: No  Reduced sex drive: Yes  Discomfort or painful urination: No  Flank pain: No  Frequent urination: No  Genital sore: No  Blood in urine: No  Difficulty starting or maintaining a urine stream: No  Incomplete emptying: No  Frequently waking up at night to urinate: No  Pelvic pain: No  Urgency to urinate: No  Inability to speak due to damaged vocal cords. : No  Brief paralysis: No  Difficulty concentrating: Yes  Disturbances in coordination: Yes  Daytime sleepiness: Yes  Dizziness: Yes  Weakness in specific regions of the body: No  Generalized weakness (weakness throughout the body) : Yes  Headaches: Yes  Light-headedness: Yes  Loss of balance: Yes  Numbness: Yes  Tingling sensations: Yes  Seizures: No  Sensory changes: Yes  Tremors: No  Vertigo: No  Altered mental status : Yes  Depression: No  Hallucinations: Yes  Hypervigilance (state of increased alertness and anxiety) : Yes  Insomnia: Yes  Memory loss: Yes  Nervous or anxious: Yes  Substance abuse: No  Thoughts of suicide: No  Thoughts of violence: No  Environmental allergies: Yes  HIV exposure: No  Hives: No  Persistent infections: No

## 2023-02-22 NOTE — Patient Instructions (Addendum)
 Follow up with Dr Sandra Cockayne in 3 months.    Complete lab work.    You are scheduled for a Tilt Table Test on 03/05/23 arrival time 11:30 AM// Test time 1:00 PM  - Do not eat or drink anything for 4 hours prior to testing.

## 2023-02-22 NOTE — Progress Notes (Signed)
 [facility] HEART & VASCULAR   HEART FAILURE / CARDIOLOGY NEW PATIENT VISIT      [name]  [id number]  02/22/2023  9:50 AM    HPI:           David Jordan is a 37 y.o. White or Caucasianmale who presents today as a new patient. Presents with episodes 3

## 2023-02-24 ENCOUNTER — Ambulatory Visit: Admit: 2023-02-24 | Payer: PRIVATE HEALTH INSURANCE

## 2023-02-24 DIAGNOSIS — R072 Precordial pain: Secondary | ICD-10-CM

## 2023-02-24 LAB — METANEPHRINES, URINE, 24 HOUR
Metanephrine, Urine: 29 ug/L
Metanephrines, 24H Ur: 81 ug/(24.h) (ref 58–276)
Normetanephrine, 24H Ur: 160 ug/(24.h) (ref 156–729)
Normetanephrine, Ur: 57 ug/L

## 2023-02-24 LAB — CORTISOL, URINE FREE, 24HR
Cortisol Free, 24H Ur: 22 ug/(24.h) (ref 5–64)
Cortisol,Free,ug/L,UR: 8 ug/L

## 2023-02-24 LAB — RENIN: Renin: <0.167 ng/mL/h — ABNORMAL LOW (ref 0.167–5.380)

## 2023-02-24 LAB — METANEPHRINES, FRACTIONATED, PLASMA
Metanephrine: <25 pg/mL (ref 0.0–88.0)
Normetanephrine: 87.5 pg/mL (ref 0.0–210.1)

## 2023-03-03 ENCOUNTER — Inpatient Hospital Stay
Admit: 2023-03-03 | Discharge: 2023-03-03 | Disposition: A | Discharge status: Home / Self Care | Payer: PRIVATE HEALTH INSURANCE

## 2023-03-03 ENCOUNTER — Emergency Department: Admit: 2023-03-03 | Payer: PRIVATE HEALTH INSURANCE

## 2023-03-03 DIAGNOSIS — R42 Dizziness and giddiness: Secondary | ICD-10-CM

## 2023-03-03 DIAGNOSIS — R55 Syncope and collapse: Secondary | ICD-10-CM

## 2023-03-03 LAB — HEPATIC FUNCTION PANEL
ALT: 32 U/L (ref 7–52)
AST: 27 U/L (ref 13–39)
Albumin: 4.5 g/dL (ref 3.5–5.7)
Alkaline Phosphatase: 64 U/L (ref 36–125)
Bilirubin, Direct: 0.1 mg/dL (ref 0.0–0.4)
Bilirubin, Indirect: 0.4 mg/dL (ref 0.0–1.1)
Total Bilirubin: 0.5 mg/dL (ref 0.0–1.5)
Total Protein: 7.7 g/dL (ref 6.4–8.9)

## 2023-03-03 LAB — BASIC METABOLIC PANEL
Anion Gap: 7 mmol/L (ref 3–16)
BUN: 9 mg/dL (ref 7–25)
CO2: 29 mmol/L (ref 21–33)
Calcium: 9.3 mg/dL (ref 8.6–10.3)
Chloride: 104 mmol/L (ref 98–110)
Creatinine: 0.87 mg/dL (ref 0.60–1.30)
EGFR: >90
Glucose: 95 mg/dL (ref 70–100)
Osmolality, Calculated: 288 mosm/kg (ref 278–305)
Potassium: 4.5 mmol/L (ref 3.5–5.3)
Sodium: 140 mmol/L (ref 133–146)

## 2023-03-03 LAB — CBC
Hematocrit: 41.9 % (ref 38.5–50.0)
Hemoglobin: 14.4 g/dL (ref 13.2–17.1)
MCH: 29.7 pg (ref 27.0–33.0)
MCHC: 34.3 g/dL (ref 32.0–36.0)
MCV: 86.7 fL (ref 80.0–100.0)
MPV: 7 fL — ABNORMAL LOW (ref 7.5–11.5)
Platelets: 258 10*3/uL (ref 140–400)
RBC: 4.83 10*6/uL (ref 4.20–5.80)
RDW: 12.7 % (ref 11.0–15.0)
WBC: 5.1 10*3/uL (ref 3.8–10.8)

## 2023-03-03 LAB — URINALYSIS W/RFL TO MICROSCOPIC
Bilirubin, UA: NEGATIVE
Blood, UA: NEGATIVE
Glucose, UA: NEGATIVE mg/dL
Ketones, UA: NEGATIVE mg/dL
Leukocyte Esterase, UA: NEGATIVE
Nitrite, UA: NEGATIVE
Protein, UA: NEGATIVE mg/dL
Specific Gravity, UA: 1.006 (ref 1.005–1.035)
Urobilinogen, UA: <2 mg/dL (ref 0.2–1.9)
pH, UA: 7 (ref 5.0–8.0)

## 2023-03-03 LAB — DIFFERENTIAL
Basophils Absolute: 31 /uL (ref 0–200)
Basophils Relative: 0.6 % (ref 0.0–1.0)
Eosinophils Absolute: 87 /uL (ref 15–500)
Eosinophils Relative: 1.7 % (ref 0.0–8.0)
Lymphocytes Absolute: 1525 /uL (ref 850–3900)
Lymphocytes Relative: 29.9 % (ref 15.0–45.0)
Monocytes Absolute: 485 /uL (ref 200–950)
Monocytes Relative: 9.5 % (ref 0.0–12.0)
Neutrophils Absolute: 2973 /uL (ref 1500–7800)
Neutrophils Relative: 58.3 % (ref 40.0–80.0)
nRBC: 0 /100{WBCs} (ref 0–0)

## 2023-03-03 LAB — HIGH SENSITIVITY TROPONIN: High Sensitivity Troponin: <3 ng/L (ref 0–20)

## 2023-03-03 LAB — LIPASE: Lipase: 11 U/L (ref 4–82)

## 2023-03-03 NOTE — ED Notes (Signed)
 PT returned from cray

## 2023-03-03 NOTE — ED Notes (Addendum)
 PT to xray

## 2023-03-03 NOTE — ED Notes (Signed)
 Pt discharged to home in good condition. Discharge instructions provided to patient and explained. Patient verbalized understanding. Medications reviewed and all of patient's questions and concerns answered. Pt A&Ox4, no signs of acute distress noted. Skin pink warm and dry.

## 2023-03-03 NOTE — ED Triage Notes (Signed)
 Pt escorted to triage via wheel chair by with C/o elevated BP and abdominal pain that started today at noon. Pt spouse states that he has been having issues with BP since June.

## 2023-03-03 NOTE — ED Notes (Signed)
 MD bedside

## 2023-03-03 NOTE — ED Provider Notes (Signed)
 This patient was seen by the advanced practice provider. I have seen and examined the patient, agree with the workup, evaluation, management and diagnosis. Care plan has been discussed. I have reviewed the ECG and concur.     37 year old male who is currently being worked up as an outpatient for episodes of tachycardia who presents to the emergency department for an episode of syncope that occurred today.  Vital signs are stable.  Exam is reassuring. Workup here is reassuring.   He recently had an echo, Holter monitor, metanephrines, CT neck, chest, abdomen and pelvis, full body MRIs, lumbar puncture, broad laboratory testing, and blood cultures performed.  He has a tilt table test scheduled in a few days on 12/2. He will follow-up with his outpatient providers.  Return precautions were reviewed with the patient and family at the bedside.    General:  Well appearing.   Head: NC/AT  Eyes:  PERRL. No conjunctival injection or scleral icterus. No discharge from eyes.  ENT:  No discharge from nose.   Neck:  Supple, trachea midline  Pulmonary:   Non-labored breathing. Speaks in full sentences.   Cardiac:  Regular rate and rhythm.   Abdomen: Non-distended  Musculoskeletal:  No long bone deformity.    Vascular:  Extremities warm and perfused.  Skin:  Dry, no rashes. No lacerations or abrasions.   Extremities:  No peripheral edema  Neuro:  Alert and oriented. Moves all four extremities to command. Sensation grossly intact to light touch. Speech and mentation normal.     At this time, the patient was deemed appropriate for discharge. My customary discharge instructions, including strict return precautions for new or worsening symptoms concerning to the patient, were provided. All of patient's questions were answered satisfactorily, and he was subsequently sent home in stable condition.    Plan     1. The patient is to be discharged home in stable/improved condition.  2. Workup, treatment and diagnosis were discussed  with the patient and/or family members; the patient agrees to the plan and all questions were addressed and answered.  3. The patient is instructed to return to the emergency department should his symptoms worsen or any concern he believes warrants acute physician evaluation.

## 2023-03-03 NOTE — ED Provider Notes (Signed)
  ED Note    Date of service:  03/03/2023    Reason for Visit: Hypertension and Abdominal Pain      Patient History     HPI This is a 37 year old male patient with a history of migraines and spells that he has been experiencing 3-4 times a week since June of this year.  States he has episodes where he gets lightheaded, body aches, flushed and nauseated, this indicates to him that his blood pressure is elevated.  He had an episode today while at rest, approximately 2 hours prior to arrival.  He is accompanied by his significant other who states his blood pressure spiked during this episode.  She recites for subsequent readings.  At 1145 blood pressure was 136/89, heart rate 87, 1209 blood pressure 141/83, heart rate 78, 1217 blood pressure 139/78, heart rate 91 and 1232 blood pressure 137/86 heart rate 97.  He reportedly lost consciousness for approximately 5 minutes and then was assisted to the bathroom.  He reports chronic daily abdominal pain as well since June which is unchanged from baseline and migrates from the bilateral lower quadrants to the epigastric region.  Denies associated nausea and vomiting.  Reports frequent constipation but did have a normal bowel movement for him this morning, describes rabbit pellets.  He now complains of a generalized headache.  States these episodes typically last 45 to 60 minutes.  He otherwise has no complaints.    The patient has had an extensive evaluation of these episodes since June.  He had a negative continuous EEG due to concern for possible seizure versus PNES.  He has seen cardiology in addition to neurology as well as heme-onc.  Labs have been largely unremarkable.  CT neck, chest, abdomen and pelvis with IV contrast in October was unremarkable.  Lumbar puncture in July noted an opening pressure of 14 cm CSF.  He had an MRI of the brain and MRV in July that was normal.  Cervical spine and thoracic spine  MRI at that time also were unremarkable other than mild degenerative changes at C3-C6 without stenosis or neural compression.  Echocardiogram in June was normal with an EF of 55 to 60%.    Past Medical History:   Diagnosis Date    Allergy     Seasonal    Bilateral arm weakness     Headache     Memory loss     Migraines June 2024    Seizure-like activity (CMS-HCC) June 2024    ? seizure vs PNES per neuro, normal EEG       Past Surgical History:   Procedure Laterality Date    PR EEG PHYS/QHP 2-12 HR WITH VEEG  09/22/2022    WISDOM TOOTH EXTRACTION         Social History     Tobacco Use   Smoking Status Never   Smokeless Tobacco Never       Social History     Substance and Sexual Activity   Alcohol Use Not Currently       Social History     Substance and Sexual Activity   Drug Use Never       Previous Medications    CHOLECALCIFEROL, VITAMIN D3, 50 MCG (2,000 UNIT) TAB    Take 2,000 Units by mouth daily.    CYANOCOBALAMIN  (VITAMIN B-12) 1000 MCG TABLET    Take 1 tablet (1,000 mcg total) by mouth daily.    FAMOTIDINE  (PEPCID ) 20 MG TABLET    Take  1 tablet (20 mg total) by mouth 2 times a day.    IBUPROFEN (MOTRIN) 200 MG TABLET    Take 1 tablet (200 mg total) by mouth every 6 hours as needed for Pain.    MULTIVITAMIN (THERAGRAN) TABLET    Take 1 tablet by mouth daily.       Allergies:   Allergies as of 03/03/2023 - Fully Reviewed 03/03/2023   Allergen Reaction Noted    Codeine Hives and Nausea And Vomiting 01/20/2020    Shellfish derived Swelling and Rash 09/21/2022       Review of Systems     Review of Systems   Constitutional:  Positive for appetite change and fatigue. Negative for fever.   Respiratory:  Negative for cough and shortness of breath.    Cardiovascular:  Negative for chest pain and leg swelling.   Gastrointestinal:  Positive for abdominal pain and nausea. Negative for constipation, diarrhea and vomiting.   Musculoskeletal:  Positive for myalgias.   Neurological:  Positive for syncope, light-headedness and  headaches.   All other systems reviewed and are negative.          Physical Exam     ED Triage Vitals [03/03/23 1350]   Vital Signs Group      Temp 98.4 F (36.9 C)      Core (Body) Temperature       Temp Source Oral      Heart Rate 86      Heart Rate Source Monitor      Resp 16      SpO2 100 %      BP (!) 153/105      MAP (mmHg) 117      BP Method Automatic      BP Location Right upper arm      BP Cuff Size Regular      Patient Position Sitting   SpO2 100 %   O2 Device None (Room air)       Physical Exam  Vitals reviewed.   Constitutional:       General: He is not in acute distress.     Appearance: He is well-developed and underweight.   HENT:      Head: Normocephalic and atraumatic.      Mouth/Throat:      Mouth: Mucous membranes are moist.   Neck:      Trachea: Phonation normal.   Cardiovascular:      Rate and Rhythm: Normal rate and regular rhythm.      Heart sounds: No murmur heard.     No friction rub. No gallop.   Pulmonary:      Effort: Pulmonary effort is normal. No respiratory distress.      Breath sounds: No wheezing, rhonchi or rales.   Abdominal:      General: There is no distension.      Palpations: Abdomen is soft.      Tenderness: There is no abdominal tenderness.   Musculoskeletal:      Cervical back: Normal range of motion and neck supple.      Right lower leg: No edema.      Left lower leg: No edema.   Skin:     General: Skin is warm and dry.      Findings: No petechiae or rash.   Neurological:      Mental Status: He is alert and oriented to person, place, and time.   Psychiatric:         Mood and  Affect: Mood and affect normal.         Behavior: Behavior normal.           Diagnostic Studies     EKG: Normal sinus rhythm at a rate of 82 with no acute ischemic change or ST elevation.  Normal axis. PR interval 152 ms, QRS duration 76 ms, QT interval 352 ms, QTc 411 ms.  This is reviewed by Dr. Monette Angus.  There is no acute change compared to his EKG from 12/28/2022.    Procedures    ED Course and MDM      David Jordan is a 37 y.o. male who presented to the emergency department with Hypertension and Abdominal Pain    The patient presents with reports of spells and constant abdominal pain over the last 6 months with a spell today associated with syncope and elevated blood pressure for him causing him concern.  His vital signs are stable, he is well-appearing with no acute findings on exam, no neurodeficits.  He declined medication for his headache.  Orthostatics are ordered to evaluate for possible dehydration.    Labs include an unremarkable CBC, BMP, liver panel and lipase.  Troponin is pending.    Chest x-ray is pending.    The patient will be turned over to Dr. Monette Angus at this time pending diagnostic results and final disposition.  Given his unremarkable diagnostic evaluation today as well as extensive evaluation as an outpatient, I do not feel that admission will be indicated.  He will likely be discharged to follow-up as planned with cardiology in 2 days.    Medical Decision Making  Problems Addressed:  Chronic abdominal pain: chronic illness or injury  Syncope and collapse: complicated acute illness or injury    Amount and/or Complexity of Data Reviewed  Independent Historian: spouse  External Data Reviewed: labs, radiology, ECG and notes.  Labs: ordered. Decision-making details documented in ED Course.  Radiology: ordered. Decision-making details documented in ED Course.  ECG/medicine tests: ordered. Decision-making details documented in ED Course.      The patient tolerated their visit well.  The patient and / or the family were informed of the results of any tests, a time was given to answer questions, a plan was proposed and they agreed with plan.     PENDING DIAGNOSES:      ICD-10-CM    1. Syncope and collapse  R55       2. Chronic abdominal pain  R10.9     G89.29                           Asenath Law, [place]  03/03/23 1457

## 2023-03-03 NOTE — Discharge Instructions (Signed)
 You were seen in the emergency department for syncope (passing out). Continue taking your medications as prescribed and follow-up with your primary care provider. Stay well hydrated. You need to see your regular doctor in 2 days to be checked. If you are unable to see your primary care provider and start to experience new or worsening symptoms, including further episodes of passing out, chest pain, or any other concerning symptoms, please return to the emergency department for re-evaluation.  Future Appointments   Date Time Provider Department Center   03/19/2023 10:00 AM Lucinda Saber, CNP [facility] NEUR [facility]   04/09/2023  9:00 AM EMU ROOM [facility] EEG [facility]   05/24/2023 11:30 AM Carvin Clarke, MD [facility] CARD [facility]   07/02/2023 10:10 AM Rudean Corrente, MD [facility] NEUR [facility]

## 2023-03-05 MED ORDER — sodium chloride 0.9 % IV infusion
INTRAVENOUS | Status: AC
Start: 2023-03-05 — End: ?

## 2023-03-05 MED ORDER — sodium chloride 0.9 % IV infusion
INTRAVENOUS | Status: AC | PRN
Start: 2023-03-05 — End: 2023-03-05
  Administered 2023-03-05: 19:00:00 via INTRAVENOUS

## 2023-03-05 MED ORDER — nitroGLYCERIN (NITROSTAT) SL tablet
0.4 | SUBLINGUAL | PRN
Start: 2023-03-05 — End: 2023-03-05
  Administered 2023-03-05: 18:00:00 via SUBLINGUAL

## 2023-03-05 MED ORDER — nitroGLYCERIN (NITROSTAT) 0.4 MG SL tablet
0.4 | SUBLINGUAL | Status: AC
Start: 2023-03-05 — End: ?

## 2023-03-05 MED FILL — NITROGLYCERIN 0.4 MG SUBLINGUAL TABLET: 0.4 0.4 MG | SUBLINGUAL | Qty: 25

## 2023-03-05 MED FILL — SODIUM CHLORIDE 0.9 % INTRAVENOUS SOLUTION: INTRAVENOUS | Qty: 1000

## 2023-03-05 NOTE — Interval H&P Note (Signed)
 H&P reviewed, patient examined, no changes to H&P.

## 2023-03-05 NOTE — Procedures (Signed)
Demographics:  Name: David Jordan   MRN: [id number]    Referring Physician: Lionel December, MD    Attending Physician: Neomia Dear. Shawnie Pons, MD  Assistant Physician: None    Summary of Procedures:  Head-up tilt table testing with nitroglycerin challenge for syncope and pre-syncope (46962)    Preprocedure Diagnosis:  Syncope, unclear etiology    Indication:  Syncope, unclear etiology    Post-Procedure Diagnosis:  Vasodepressor syncope    Briefly, patient with recurrent episodes of dizziness with unclear etiology. He is referred for elective head up tilt table testing.    Description of Procedure:  Informed consent was obtained from the patient prior to the procedure after risks and benefits were explained in detail. Patient was brought to the [facility] in the postabsorptive, non-sedated state. Peripheral intravenous access was established. Continuous electrocardiographic, SaO2, and blood pressure monitoring was initiated, and the restraining straps were applied to secure the patient on the tilt table.    Following positioning of the patient, supine vital signs were recorded. Baseline in the supine position was BP 131/93 mmHg (HR 82 bpm). The patient was raised to 70 degrees head-up tilt. Immediately after position change, the heart rate accelerated to 86 bpm and the blood pressure was 119/83 mm Hg. Three minutes after head-up tilt, the heart rate was 101 bpm and the blood pressure was 124/89 mm Hg. The patient remained at a head-up tilt for 10 minutes without development of symptoms. The maximum heart rate during this time was 103 bpm associated with a blood pressure of 109/75 mm Hg at 5 minutes. The minimum blood pressure was 109/75 mm Hg associated with a heart rate of 103 bpm at 7 minutes.     The patient was then given sublingual nitroglycerin and observed for another 10 minutes. The patient developed symptoms of dizziness, nausea and clamminess during the test  at 5 minutes following administration of nitroglycerin. At the time of the symptoms, the heart rate was 155 bpm and the blood pressure was 103/86 mm Hg. The patient's symptoms progressed to complete loss of consciousness, at which time he  was immediately returned to supine position. The next available set of vial signs following the syncopal event was a heart rate of 106 bpm and a blood pressure of 88/60 mm Hg.    The test was stopped because of the development of symptoms. The patient was returned to a supine position.     Specimens removed: None     Complications: None     Estimated Blood Loss: None     Anesthesia: None    Summary: (positive)  1)  Positive inducible syncope and reproduction of symptoms  2)  Positive vasodepressor response consistent with vasodepressor syndrome  3)  No evidence of postural orthostatic tachycardia syndrome (POTS)  4)  Findings consistent with vasodepressor syndrome    Recommendations:  1)  Aggressive life style modification, including increased oral fluid intake, and avoidance of caffeine and alcohol.  Consider additional salt supplementation.  2)  Precautionary measures with symptoms, including sitting or lying down when safe to do so  3)  No driving for minimum 3 months following last syncopal episode  4)  Consider thigh high Jobst compression stockings

## 2023-03-05 NOTE — Plan of Care (Signed)
RN Care Management Discharge Assessment    Reason for Today's Outreach: Hospital Discharge Follow Up (03/03/23 WC ER DC)     Next Steps:   - RN reviewed chart for a dc follow up call and noted he was being dc'd after having a tilt test today.  - Patient is being worked up be cardiology and neurology for his ongoing symptoms.  - No call placed to patient.  - Patient also being seen by [facility].  - Also has an upcoming appt with the [facility] in 04/2023 for neurology.    Date of Next Follow Up:   Provider Department Center    03/19/2023 10:00 AM (Arrive by 9:45 AM) Danielle Dess, CNP [facility]   04/09/2023 9:00 AM EMU ROOM [facility] EEG [facility]   05/24/2023 11:30 AM (Arrive by 11:15 AM) Lionel December, MD [facility]   07/02/2023 10:10 AM (Arrive by 9:55 AM) Elmarie Shiley, MD  Neurology at [facility]     Preferred Contact/Time: [phone]    Discharge Assessment  Hospital Encounter CSN: [id number]  Patient discharged from: ED  Interventions:  (Comment: bp controlled. not diabetic. er and ip admits r/t undiagnosed neuro issues. not a pc cm candidate.)       Herma Ard, RN  Primary Care - Care Manager

## 2023-03-05 NOTE — Progress Notes (Signed)
Pt. Discharged per wheelchair to exit.        Discharge Pain level- 0/10. Patient states pain is tolerable.    Patient and family verbalized understanding of discharge instructions.      Vitals:    03/05/23 1347   BP: 104/88   Pulse: 97   Resp: 14   Temp: 98 F (36.7 C)   SpO2: 99%         Intake- 500 IV fluids; 300 mls oral      Tawanna Sat, RN    Script none

## 2023-03-07 ENCOUNTER — Ambulatory Visit: Payer: PRIVATE HEALTH INSURANCE

## 2023-03-16 NOTE — Progress Notes (Signed)
Message reviewed with Dr Sandra Cockayne, per MD response request to speak with patient GI MD for further recommendations.

## 2023-03-19 ENCOUNTER — Ambulatory Visit: Payer: PRIVATE HEALTH INSURANCE

## 2023-03-20 ENCOUNTER — Ambulatory Visit: Admit: 2023-03-20 | Payer: PRIVATE HEALTH INSURANCE

## 2023-03-20 ENCOUNTER — Ambulatory Visit: Payer: PRIVATE HEALTH INSURANCE

## 2023-03-20 DIAGNOSIS — R232 Flushing: Secondary | ICD-10-CM

## 2023-03-20 NOTE — Progress Notes (Signed)
[facility]  [facility] [place]  [address]  [place] [address]    Name:  David Jordan Date of Birth: Sep 21, 1985 (37 y.o.)   MRN: [id number]    Date of Service:  03/20/2023   2:31 PM      Subjective   Chief Complaint:     Chief Complaint   Patient presents with    Follow-up     No concern        History of Present Illness:   Jordan David is a(n) 37 y.o. male here today for the following:     HPI    Still cannot get Dr. Lorella Nimrod office to release him for the colonoscopy. Dr. Lorella Nimrod office had requested contact info to speak with GI directly, but per [name] conversation with GI this morning, this has not been done.     Loletta Parish wanted paperwork to note that Dr. Sandra Cockayne had seen him and what the next steps were, and they also have not received that. Notes that Loletta Parish seems to want to only speak to most recent provider, rather than neuro, who told him not to go back to work, or to me.     Syncope 11/30 - happened like usual episode and noted his body getting flushed, feeling like he was going to throw up. BP was 140s systolic. Waited and drank some water, drank some Powerade. 5-10 minutes later was still feeling poorly and BP was still climbing. Heart rate around 90s but not excessive. Peak around 120 at the time.     Wife David Jordan saw the LOC - eyes stayed open but nobody was home and when he finally started responding, 3-4 minutes later, he was crying, and noted that he couldn't move. She had to manually manipulate his body. Brother had to come over and help carry him up the stairs, took him to ER. Notes outlier from this episode, besides the complete syncope, but he felt like he was urinating, and was not urinating on himself, but did go upstairs and sit down to urinate, and could not feel himself urinating. Called brother who is a local EMT, but he had already regained consciousness by the time his brother arrived.     Up to the point of losing  consciousness, those symptoms were all replicated 100% when he was given nitro during the tilt test. Did not LOC during tilt, because the test was aborted, but that it was a positive result. Has not received communication back from cardiology regarding this.     Letter referenced during 02/23/23 patient message did not get letter posted, nor did they get office notes from 02/23/23, only AVS, which did outline the next steps.     While in-office with Dr. Sandra Cockayne, discussing tilt table test, they discussed a potential hormone transmission issue, but that she felt this was more heart-related, although driver for heart problem unclear.     Still no clarification as to whether to still see Dr. Electa Sniff on January 6th or not, based on recommendations from Dr. Sandra Cockayne.     He notes that he feels his blood pressure is fluctuating more often than when it did in the past. Full flushed sensation as well as nausea more often than in the past,     He notes that Dr. Sandra Cockayne did potentially mention something about adrenal communication with the heart.     History of Present Illness  The patient, with a complex medical history, presents  with ongoing health concerns that have significantly impacted his quality of life. The primary concern is a series of episodes characterized by a flushed feeling, sweating, nausea, and elevated blood pressure. These episodes have been increasing in frequency and severity, culminating in an episode on November 30th where the patient lost consciousness for the first time. During this episode, the patient also experienced urinary symptoms, including the sensation of urination without actual urination and later an inability to sense urination.    These episodes have been replicated during a tilt test, where the patient experienced identical symptoms, including full body sweats and nausea. However, the patient did not lose consciousness during the test. Despite the completion of the tilt test, the patient has not  received any communication regarding the results or next steps.    In addition to these episodes, the patient reports changes in bowel habits, including dark stools and the need for regular laxative use. The patient was scheduled for a colonoscopy to investigate these symptoms, but the procedure was canceled the day before it was due to take place. The patient has been unable to get approval for the colonoscopy to be rescheduled.    The patient's health concerns have resulted in him being unable to work, and he is currently navigating the process of applying for disability. However, this process has been complicated by a lack of communication from his healthcare providers, resulting in delays and frustration. The patient is particularly concerned about maintaining his health insurance coverage so he can continue to seek treatment and find answers to his health concerns.      Current Outpatient Medications:  Current Outpatient Medications   Medication Sig Dispense Refill    cholecalciferol, vitamin D3, 50 mcg (2,000 unit) Tab Take 2,000 Units by mouth daily.      cyanocobalamin (VITAMIN B-12) 1000 MCG tablet Take 1 tablet (1,000 mcg total) by mouth daily. 30 tablet 0    famotidine (PEPCID) 20 MG tablet Take 1 tablet (20 mg total) by mouth 2 times a day. 60 tablet 3    ibuprofen (MOTRIN) 200 MG tablet Take 1 tablet (200 mg total) by mouth every 6 hours as needed for Pain.      multivitamin (THERAGRAN) tablet Take 1 tablet by mouth daily.       No current facility-administered medications for this visit.         ROS:   Review of Systems  See HPI.       Objective:     Vitals:    03/20/23 1407   BP: 128/84   BP Location: Right upper arm   Patient Position: Sitting   BP Cuff Size: Large   Pulse: 82   SpO2: 98%   Weight: 138 lb 6.4 oz (62.8 kg)     Body mass index is 20.44 kg/m.    Physical Exam    Physical Exam   Constitutional: He is oriented to person, place, and time. He appears well-developed and well-nourished. No  distress.   HENT:   Head: Normocephalic and atraumatic.   Right Ear: External ear normal.   Left Ear: External ear normal.   Nose: Nose normal.    Eyes: Conjunctivae normal and EOM are normal. Pupils are equal, round.  Neck: Normal range of motion. Neck supple.   Musculoskeletal: Normal range of motion.   Neurological: He is alert and oriented to person, place, and time. He exhibits normal muscle tone. Coordination normal.   Skin: Skin is warm and dry. No rash  noted. He is not diaphoretic. No erythema. No pallor.   Psychiatric: He has a normal mood and affect. His behavior is normal. Judgment and thought content normal.            Assessment/Plan:   There are no diagnoses linked to this encounter.    No follow-ups on file.       Assessment & Plan  Syncope and Presyncope Episodes    He has experienced recurrent episodes of syncope and presyncope, most recently on March 03, 2023, characterized by full body flushing, sweating, nausea, elevated blood pressure, and heart rate. A tilt table test replicated symptoms without leading to full syncope. The differential diagnosis includes pheochromocytoma or other adrenal-related issues. We discussed that pheochromocytoma might not always be visible on CT scans and that an MRI of the abdomen could be more effective. If pheochromocytoma is confirmed, surgical removal would be the treatment, with risks including surgical complications and the potential need for hormone replacement therapy, but benefits include symptom resolution and prevention of further episodes. We will refer him to endocrinologist Dr. Webb Silversmith for evaluation and possibly repeat the urine test for hormone levels if Dr. Webb Silversmith deems it necessary.    Hypertension    His blood pressure has been elevated during episodes of syncope and presyncope, with readings fluctuating. We discussed the importance of monitoring blood pressure and maintaining hydration and electrolyte balance during episodes to manage symptoms. He  should monitor his blood pressure regularly and ensure adequate hydration and electrolyte intake during episodes.    Abdominal Pain and Changes in Stool    He has persistent abdominal pain and changes in stool consistency and frequency, with dark brown stool noted but no blood detected. Colonoscopy and endoscopy have been delayed due to lack of clearance from Dr. Sandra Cockayne. We discussed the need for these procedures to rule out gastrointestinal causes, noting that delays could lead to worsening symptoms and potential undiagnosed conditions, while benefits include diagnostic clarity and potential treatment. He should follow up with Dr. Sandra Cockayne for clearance for colonoscopy and endoscopy and consider repeating bowel prep and scheduling the colonoscopy once clearance is obtained.    General Health Maintenance    We discussed the regular use of laxatives and dietary modifications to manage bowel movements, advising on the safe use of stool softeners and laxatives to prevent bowel obstruction. He should use stool softeners or laxatives as needed to maintain regular bowel movements and avoid corn and peanuts to prevent bowel obstruction.    Follow-up    He should follow up with endocrinologist Dr. Webb Silversmith as soon as possible and with Dr. Sandra Cockayne for further communication and clearance for procedures. He must keep his primary care provider updated on any changes or new symptoms.              No LOS data to display        2:31 PM       Venita Sheffield, PA

## 2023-03-21 NOTE — Telephone Encounter (Signed)
We can request to be on a cancellation list with endo/Dr. Webb Silversmith, and see if they get lucky on a cancellation. We can also request an e-consult with endo (will be variable which provider is covering them on the day we put it through) and see if they will DO this as e-consult or if they feel he is too involved. But e-consult, we give specific questions and concerns, and they send their recommendations of treatment or evaluation/testing. Typically costs the patient about $30, not always covered by insurance. I can put this in, hopefully today, if that is something they want to do.

## 2023-03-21 NOTE — Telephone Encounter (Signed)
WIFE CALLED IN STATING THEY CAME IN YESTERDAY AND WAS TOLD TO SCHEDULE A APPOINTMENT WITH DOCTOR WELCH  . WHEN THEY CALLED HE SAID THE SOONEST APPOINTMENT THEY HAVE IS APRIL , PATIENT WIFE WANTED TO KNOW IS THAT OKAY BECAUSE THEY WERE TOLD TO MAKE ONE AS SOON AS POSSIBLE

## 2023-04-02 ENCOUNTER — Ambulatory Visit: Admit: 2023-04-02 | Payer: PRIVATE HEALTH INSURANCE

## 2023-04-02 DIAGNOSIS — R222 Localized swelling, mass and lump, trunk: Secondary | ICD-10-CM

## 2023-04-02 NOTE — Telephone Encounter (Signed)
Patient woke up today with swelling in center of his chest/abdominal area right were rib cage starts.also Swelling right side lower than the other one still is sitting on top of his rib cage. Like balls of swelling. Can wife on what you recommend.

## 2023-04-02 NOTE — Telephone Encounter (Signed)
I would suggest to be seen

## 2023-04-02 NOTE — Progress Notes (Signed)
[facility] [place]  [facility] [place]  [address]  [name] [address]  Subjective   Name:  David Jordan Date of Birth: [birth year] (37 y.o.)   MRN: [id number]    Date of Service:  04/02/2023      Chief Complaint   Patient presents with    left side of chest is swollen     Abdominal Pain     The right side of abdomen is swollen and in pain, pain level is 3 while sitting, if pt is laying the wrong way pain level can increase to a 8.       History of Present Illness:  David Jordan is a(n) 37 y.o. male here today for the following:   HPI    Woke up this morning with swelling in his epigastric area. Never noticed this before. Feels like bumps. 2 total spots one underneath the left breast and one of the right side of abdomen. No injury. No fever/chills. The right sided area is painful to the touch. Had CT in the abdomen in Oct 2024 which was overall normal. Still struggling with abdominal discomfort and constipation almost daily. PCP has MRI of Abdomen ordered to look at the adrenal glands for possible pheochromocytoma. He reports this morning when he woke up the swelling was much more dramatically exposed. After eating it is less noticeable.     Current Outpatient Medications   Medication Sig Dispense Refill    cholecalciferol, vitamin D3, 50 mcg (2,000 unit) Tab Take 2,000 Units by mouth daily.      cyanocobalamin (VITAMIN B-12) 1000 MCG tablet Take 1 tablet (1,000 mcg total) by mouth daily. 30 tablet 0    famotidine (PEPCID) 20 MG tablet Take 1 tablet (20 mg total) by mouth 2 times a day. 60 tablet 3    ibuprofen (MOTRIN) 200 MG tablet Take 1 tablet (200 mg total) by mouth every 6 hours as needed for Pain.      multivitamin (THERAGRAN) tablet Take 1 tablet by mouth daily.       No current facility-administered medications for this visit.      Review of Systems  See HPI       Objective   Vitals:    04/02/23 1541   BP: 130/76   Pulse: 88   SpO2: 98%   Weight: 136 lb  6.4 oz (61.9 kg)     Body mass index is 20.14 kg/m.    Physical Exam  Chest:          Comments: No noticeable swelling under the left hilar area of the chest , slightly softer when compared to the right  Abdominal:      Tenderness: There is generalized abdominal tenderness and tenderness in the right upper quadrant and epigastric area. There is no guarding.          Comments: Pt with tender RUQ on palpation        Assessment & Plan     Cindee Salt was seen today for left side of chest is swollen  and abdominal pain.    Diagnoses and all orders for this visit:    Lump in chest (Primary)  -     X-ray Chest PA and Lateral; Future  -     [place] Chest; Future    RUQ abdominal pain  -     US Abdomen Complete; Future    1) Lump in Chest- no profound difference btwn right side and  left side of chest. Possibly slightly more soft on the left than the right but due to symptoms / pt's report we will do XR and [place] of chest.     2) RUQ pain- has been having this epigastric pain for quite sometime, taking pepcid. Has MRI scheduled of the abdomen upcoming for possible pheochromocytoma. He does have + RUQ tenderness so we are going to have RUQ completed.     Tonee Silverstein, CNP

## 2023-04-02 NOTE — Telephone Encounter (Signed)
Called patient and got him scheduled for today at 4 with Abby.

## 2023-04-03 ENCOUNTER — Inpatient Hospital Stay
Admit: 2023-04-03 | Discharge: 2023-04-04 | Disposition: A | Discharge status: Home / Self Care | Payer: PRIVATE HEALTH INSURANCE

## 2023-04-03 ENCOUNTER — Emergency Department: Admit: 2023-04-03 | Payer: PRIVATE HEALTH INSURANCE

## 2023-04-03 DIAGNOSIS — K5900 Constipation, unspecified: Secondary | ICD-10-CM

## 2023-04-03 DIAGNOSIS — R109 Unspecified abdominal pain: Secondary | ICD-10-CM

## 2023-04-03 LAB — URINALYSIS W/RFL TO MICROSCOPIC
Bilirubin, UA: NEGATIVE
Blood, UA: NEGATIVE
Glucose, UA: NEGATIVE mg/dL
Ketones, UA: 40 mg/dL — AB
Leukocyte Esterase, UA: NEGATIVE
Nitrite, UA: NEGATIVE
Protein, UA: NEGATIVE mg/dL
Specific Gravity, UA: 1.009 (ref 1.005–1.035)
Urobilinogen, UA: <2 mg/dL (ref 0.2–1.9)
pH, UA: 6 (ref 5.0–8.0)

## 2023-04-03 LAB — DIFFERENTIAL
Basophils Absolute: 32 /uL (ref 0–200)
Basophils Relative: 0.6 % (ref 0.0–1.0)
Eosinophils Absolute: 70 /uL (ref 15–500)
Eosinophils Relative: 1.3 % (ref 0.0–8.0)
Lymphocytes Absolute: 1836 /uL (ref 850–3900)
Lymphocytes Relative: 34 % (ref 15.0–45.0)
Monocytes Absolute: 470 /uL (ref 200–950)
Monocytes Relative: 8.7 % (ref 0.0–12.0)
Neutrophils Absolute: 2992 /uL (ref 1500–7800)
Neutrophils Relative: 55.4 % (ref 40.0–80.0)
nRBC: 0 /100{WBCs} (ref 0–0)

## 2023-04-03 LAB — LIPASE: Lipase: 8 U/L (ref 4–82)

## 2023-04-03 LAB — HEPATIC FUNCTION PANEL
ALT: 24 U/L (ref 7–52)
AST: 33 U/L (ref 13–39)
Albumin: 5 g/dL (ref 3.5–5.7)
Alkaline Phosphatase: 60 U/L (ref 36–125)
Bilirubin, Direct: 0.1 mg/dL (ref 0.0–0.4)
Bilirubin, Indirect: 1.1 mg/dL (ref 0.0–1.1)
Total Bilirubin: 1.2 mg/dL (ref 0.0–1.5)
Total Protein: 8.1 g/dL (ref 6.4–8.9)

## 2023-04-03 LAB — CBC
Hematocrit: 43.3 % (ref 38.5–50.0)
Hemoglobin: 14.9 g/dL (ref 13.2–17.1)
MCH: 29.1 pg (ref 27.0–33.0)
MCHC: 34.5 g/dL (ref 32.0–36.0)
MCV: 84.4 fL (ref 80.0–100.0)
MPV: 6.9 fL — ABNORMAL LOW (ref 7.5–11.5)
Platelets: 268 10*3/uL (ref 140–400)
RBC: 5.13 10*6/uL (ref 4.20–5.80)
RDW: 12.7 % (ref 11.0–15.0)
WBC: 5.4 10*3/uL (ref 3.8–10.8)

## 2023-04-03 LAB — BASIC METABOLIC PANEL
Anion Gap: 10 mmol/L (ref 3–16)
BUN: 11 mg/dL (ref 7–25)
CO2: 25 mmol/L (ref 21–33)
Calcium: 9.6 mg/dL (ref 8.6–10.3)
Chloride: 102 mmol/L (ref 98–110)
Creatinine: 0.87 mg/dL (ref 0.60–1.30)
EGFR: >90
Glucose: 84 mg/dL (ref 70–100)
Osmolality, Calculated: 283 mosm/kg (ref 278–305)
Potassium: 5.3 mmol/L (ref 3.5–5.3)
Sodium: 137 mmol/L (ref 133–146)

## 2023-04-03 MED ORDER — OMNIPAQUE (iohexol) 350 mg iodine/mL 125 mL
350 | Freq: Once | INTRAVENOUS | Status: AC | PRN
Start: 2023-04-03 — End: 2023-04-03
  Administered 2023-04-03: via INTRAVENOUS

## 2023-04-03 NOTE — ED Triage Notes (Addendum)
PCP sent pt for growth on his abdomen x 48 h which can be felt from outside, also no BM since Saturday, except some mucous. Endorsees abd pain.  Went to PCP yesterday, but growth became bigger and more painful

## 2023-04-03 NOTE — Telephone Encounter (Signed)
Pt's ultrasound is scheduled for Friday. I think it is okay to monitor until he has his ultrasounds completed. There is not much else to do in the primary care environment other than the imaging ordered. If the symptoms are becoming severe/developing severe abdominal pain he should go to ER. They may be able to get a CT done of the abdomen today.

## 2023-04-03 NOTE — Telephone Encounter (Addendum)
Called and spoke with pt, feels the right sided swelling that was present yesterday, has moved lower and increased in size. Less pain. Right sided lump under his nipple has appeared now, size of kidney bean, it is moveable and nonpainful.     Has not been able to have BM for 2 days. Having a lot of gas. He was able to pass a little stool, it was yellowish mucus. Concerned about this. Has been taking laxatives with no relief.     I explained to pt that I was not sure I could get sooner appt for ultrasound other than Friday which is scheduled. If he would like urgent imaging I recommended pt go to ER.    Discussed red flag signs and symptoms of worsening condition, when to call the office and when to seek a higher level of care.

## 2023-04-03 NOTE — Other (Signed)
[facility] Emergency Department  MEDICAL SCREENING EXAM    Date of Service: 04/03/2023    Reason for Visit: Constipation, Mass, and Abdominal Pain      Abbreviated Patient History     HPI: Emir Strommer is a 37 y.o. male presenting with various lumps vs area of swelling to the abdomen. Patient states that he woke up yesterday, noticed a small area of swelling to the left lower chest wall. He also felt that a bulge/swelling to the right side of his abdomen compared to the left. He states that the area of swelling to the chest has resolved at this time but he now has more areas of swelling in the abdomen. He states that he has been constipated and is unsure if the bulges just reflect stool moving around. He states that he saw his PCP yesterday for this and they thought it might have been his liver. He is scheduled for an XRAY and Ultrasound Friday but came in to be evaluated sooner. He denies fevers, chills, difficulty breathing associated with the lumps, vomiting. Reports last BM Saturday.    Past Medical History:   Diagnosis Date    Allergy     Seasonal    Bilateral arm weakness     Headache     Memory loss     Migraines June 2024    Seizure-like activity (CMS-HCC) June 2024    ? 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    Current Outpatient Medications:     cholecalciferol, vitamin D3, 50 mcg (2,000 unit) Tab, Take 2,000 Units by mouth daily., Disp: , Rfl:     cyanocobalamin (VITAMIN B-12) 1000 MCG tablet, Take 1 tablet (1,000 mcg total) by mouth daily., Disp: 30 tablet, Rfl: 0    famotidine (PEPCID) 20 MG tablet, Take 1 tablet (20 mg total) by mouth 2 times a day., Disp: 60 tablet, Rfl: 3    ibuprofen (MOTRIN) 200 MG tablet, Take 1 tablet (200 mg total) by mouth every 6 hours as needed for Pain., Disp: ,  Rfl:     multivitamin (THERAGRAN) tablet, Take 1 tablet by mouth daily., Disp: , Rfl:     Allergies   Allergen Reactions    Codeine Hives and Nausea And Vomiting     Hives and Fever    Shellfish Derived Swelling and Rash       Brief ROS     Pertinent positive and negative findings as documented above in HPI    Targeted Physical Exam     ED Triage Vitals [04/03/23 1527]   Encounter Vitals Group      BP (!) 144/97      Systolic BP Percentile       Diastolic BP Percentile       Heart Rate 80      Resp 16      Temp 98.4 F (36.9 C)      Temp Source Temporal      SpO2 100 %      Weight 130 lb (59 kg)      Height 5' 9 (1.753 m)      Head Circumference       Peak Flow  Pain Score Two      Pain Loc       Pain Education       Exclude from Growth Chart        General:  Well appearing. No acute distress  Head: Atraumatic. Normocephalic  Eyes:  Sclera anicteric. No conjunctival injection  ENT:  Mucous membranes moist  Neck/Back: Neck supple. No gross deformities  Chest/Pulmonary:   No respiratory distress  Cardiac:  Regular rate  Vascular:  Extremities warm and perfused  Extremities:  No peripheral edema. No deformities  Abdomen: Non-distended. TTP epigastric region. No obvious swelling on abdominal exam with patient sitting upright in chair.  Neuro: Alert and oriented x 3. No gross neurological deficits  Psych: Normal affect and mood  Skin:  No rash. Well perfused    Plan     Patient evaluated from the lobby for a medical screening exam.  In short, this is a patient presenting with abdominal swelling.     To further evaluate, the following orders have been placed:    Labs Reviewed   CBC - Abnormal; Notable for the following components:       Result Value    MPV 6.9 (*)     All other components within normal limits   URINALYSIS W/RFL TO MICROSCOPIC - Abnormal; Notable for the following components:    Color, UA Colorless (*)     Ketones, UA 40 (*)     All other components within normal limits    Narrative:     Microscopic  testing is not performed when the dipstick is negative for blood, leukocyte, protein and nitrite.   DIFFERENTIAL   BASIC METABOLIC PANEL   HEPATIC FUNCTION PANEL   LIPASE     No orders to display       See primary provider's note for full details and final disposition.     Dispo     Stable for lobby while awaiting ED bed and additional diagnostics    Amanpreet Delmont Lavonna Rua, PA

## 2023-04-03 NOTE — Telephone Encounter (Signed)
Patient came in yesterday for 2 lumps on his abdomen they appeared yesterday morning now 2 more appeared and the ones that appeared yesterday are now larger and they one that appeared today is now larger then the ones from yesterday visit , patient wanted to know should he come in to be seen or what do you advise him to do

## 2023-04-03 NOTE — ED Notes (Signed)
Bed: A05W  Expected date:   Expected time:   Means of arrival:   Comments:  This is CLEAN

## 2023-04-03 NOTE — ED Provider Notes (Signed)
[place] ED Note    Date of service:  04/03/2023    Reason for Visit: Constipation, Mass, and Abdominal Pain      Patient History     HPI:  David Jordan is a 37 year old male who presented to Emergency Department for evaluation of abdominal pain.  The patient has been evaluated for concerns of possible underlying colon CA and has been recommended for colonoscopy.  The patient has had increasing intermittent periods of abdominal pain cramping and presents for evaluation.  He denies any nausea, vomiting or diarrhea.  The patient was have intermittent abdominal discomfort and presents for evaluation assessment for abdominal pain.       Past Medical History:   Diagnosis Date    Allergy     Seasonal    Bilateral arm weakness     Headache     Memory loss     Migraines June 2024    Seizure-like activity (CMS-HCC) June 2024    ? seizure vs PNES per neuro, normal EEG       Past Surgical History:   Procedure Laterality Date    PR EEG PHYS/QHP 2-12 HR WITH VEEG  09/22/2022    WISDOM TOOTH EXTRACTION         Social History     Tobacco Use   Smoking Status Never   Smokeless Tobacco Never       Social History     Substance and Sexual Activity   Alcohol Use Not Currently       Social History     Substance and Sexual Activity   Drug Use Never       Previous Medications    CHOLECALCIFEROL, VITAMIN D3, 50 MCG (2,000 UNIT) TAB    Take 2,000 Units by mouth daily.    CYANOCOBALAMIN (VITAMIN B-12) 1000 MCG TABLET    Take 1 tablet (1,000 mcg total) by mouth daily.    FAMOTIDINE (PEPCID) 20 MG TABLET    Take 1 tablet (20 mg total) by mouth 2 times a day.    IBUPROFEN (MOTRIN) 200 MG TABLET    Take 1 tablet (200 mg total) by mouth every 6 hours as needed for Pain.    MULTIVITAMIN (THERAGRAN) TABLET    Take 1 tablet by mouth daily.       Allergies:   Allergies as of 04/03/2023 - Fully Reviewed 04/03/2023   Allergen Reaction Noted    Codeine Hives and Nausea And Vomiting 01/20/2020     Shellfish derived Swelling and Rash 09/21/2022     Family history has  been reviewed electronic medical record  Review of Systems     ROS: Constitutional:  Denies fever or chills   Eyes:  Denies change in visual acuity   HENT:  Denies nasal congestion or sore throat   Respiratory:  Denies cough or shortness of breath   Cardiovascular:  Denies chest pain or edema   GI: See HPI  GU:  Denies dysuria   Musculoskeletal:  Denies back pain or joint pain   Integument:  Denies rash   Neurologic:  Denies headache, focal weakness or sensory changes   Endocrine:  Denies polyuria or polydipsia   Lymphatic:  Denies swollen glands   Psychiatric:  Denies depression or anxiety      Physical Exam     ED Triage Vitals [04/03/23 1527]   Vital Signs Group      Temp 98.4 F (36.9 C)      Core (Body) Temperature  Temp Source Temporal      Heart Rate 80      Heart Rate Source Monitor      Resp 16      SpO2 100 %      BP (!) 144/97      MAP (mmHg) 114      BP Method Automatic      BP Location Right upper arm      BP Cuff Size Regular      Patient Position Sitting   SpO2 100 %   O2 Device None (Room air)       Constitutional:  Well developed, well nourished, no acute distress, non-toxic appearance   Eyes:  PERRL, conjunctiva normal sclera are white and extraocular movements are intact  HENT:  Atraumatic, external ears normal, nose normal, oropharynx moist, no pharyngeal exudates. Neck- normal range of motion, no tenderness, supple   Respiratory:  No respiratory distress, normal breath sounds, no rales, no wheezing   Cardiovascular:  Normal rate, normal rhythm, no murmurs, no gallops, no rubs   GI:  Soft, nondistended, normal bowel sounds, nontender, no organomegaly, no mass, no rebound, no guarding   GU:  No costovertebral angle tenderness   Musculoskeletal:  No edema, no tenderness, no deformities. Back- no tenderness  Integument:  Well hydrated, no rash   Neurologic:  Alert & oriented x 3, CN 2-12 normal, normal motor function,  normal sensory function, no focal deficits noted   Psychiatric:  Speech and behavior appropriate      Diagnostic Studies     Labs:    Please see electronic medical record for any tests performed in the ED     Radiology:    Please see electronic medical record for any tests performed in the ED    EKG:    No EKG Performed    Emergency Department Procedures     none    ED Course and MDM     David Jordan is a 37 y.o. male who presented to the emergency department with Constipation, Mass, and Abdominal Pain  The patient electronic medical record, nursing note triage note have been reviewed in electronic medical record the patient presented Emergency Department for evaluation and assessment for concerns of abdominal pain, constipation and reported abdominal mass the patient on examination has a benign nontender abdomen.  The patient initial screening labs obtained and MSE exam was performed.  The patient UA obtained was negative LFTs and lipase are normal renal profile was normal.  White count is 5 H&H is 14 and 43 CT abdomen pelvis demonstrates no acute intra-abdominal process.  The patient recommended further outpatient follow-up with primary care provider and GI follow-up for colonoscopy    Medical Decision Making  Amount and/or Complexity of Data Reviewed  Labs: ordered. Decision-making details documented in ED Course.  Radiology: ordered. Decision-making details documented in ED Course.            Critical Care Time (Attendings)   None    Final impression is abdominal pain  Disposition is discharge to home     Cherly Anderson, MD  04/03/23 2006

## 2023-04-05 NOTE — Plan of Care (Signed)
RN Care Management Discharge Assessment    Reason for Today's Outreach: ED/Inpatient Follow-up Calls (04/03/23 WC ER DC)     Next Steps:   - Chart reviewed for an ER dc follow up call.  - Patient was seen in pcp ov on 04/02/23 and imaging was ordered.  - Patient presented to the ER on 04/03/23 for the issues that he was seen by the pcp office for on 04/02/23.  - Abd xray done as well as CT of his abd/pelvis which was negative and the abd xray showed a moderate stool burden.    Date of Next Follow Up:   Provider Department Center    04/06/2023 1:00 PM [place] [place] 1 [facility]   04/06/2023 1:45 PM [place]TH [place] 1 [facility]   04/20/2023 12:30 PM (Arrive by 12:15 PM) NORTH MRI 1 [facility]   05/24/2023 11:30 AM (Arrive by 11:15 AM) Lionel December, MD [facility] [place] [facility]   07/02/2023 10:10 AM (Arrive by 9:55 AM) Elmarie Shiley, MD [facility]   07/16/2023 10:00 AM (Arrive by 9:45 AM) Knute Neu, DO [facility]     Preferred Contact/Time: [phone]    Discharge Assessment  Hospital Encounter CSN: [id number]  Patient discharged from: ED  Interventions:  (Comment: a1c and bp controlled. numerous neuro related symptoms as well as GI symptoms for bd pain and constipation.)       Herma Ard, RN  Primary Care - Care Manager

## 2023-04-05 NOTE — Telephone Encounter (Signed)
I am following up on unclosed old encounters.  Can you look at this and if it is complete, sign the encounter. If not, please complete. Thank you.

## 2023-04-06 ENCOUNTER — Inpatient Hospital Stay: Admit: 2023-04-06 | Discharge: 2023-04-10 | Payer: PRIVATE HEALTH INSURANCE

## 2023-04-06 DIAGNOSIS — R222 Localized swelling, mass and lump, trunk: Secondary | ICD-10-CM

## 2023-04-06 DIAGNOSIS — R1011 Right upper quadrant pain: Secondary | ICD-10-CM

## 2023-04-09 ENCOUNTER — Ambulatory Visit: Payer: PRIVATE HEALTH INSURANCE

## 2023-04-16 NOTE — Progress Notes (Signed)
 E-consult question:    Kristin B Spitznagel, PA   to P ENDOCRINOLOGY E-CONSULTS 04/12/2023  5:42 PM   I am requesting an E-Consult from Endocrinology Disease    My clinical question is: very complex patient with no clear diagnosis as of yet, requesting endo input on possibility of pheo previously missed for these hypertensive with flushing episodes and syncope. Can do stat referral if econsult not appropriate. Any other insight would be more than happy to pursue/refer for. Thanks so much.      The patient provided verbal consent for this Endocrinology Disease E-Consult and cost sharing for medical record consult.    If this clinical question is deemed too complex for an E-Consult, please switch the E-Consult to a referral and schedule the patient for a visit in your office. I notified the patient that they may receive a phone call to schedule an appointment.    Kristin B Spitznagel, PA  04/12/2023  5:39 PM         38 yo M with flushing   Latest Reference Range & Units 02/24/23 10:38   Metanephrine 0.0 - 88.0 pg/mL <25.0   Normetanephrine 0.0 - 210.1 pg/mL 87.5     Plasma metanephrines not elevated  CT abd, though not a dedicated adrenal CT reported normal adrenal glands     Recommend endocrine evaluation to obtain more history and details, and to evaluate suppressed renin   He is already scheduled to see Dr Janet for evaluation of hypertension and flushing  Labs with suppressed renin, please obtain aldosterone and renin level (along with potassium) together     Latest Reference Range & Units 02/24/23 10:38   Renin 0.167 - 5.380 ng/mL/hr <0.167 (L)   (L): Data is abnormally low

## 2023-04-17 NOTE — Telephone Encounter (Signed)
 E-consult results:  -noted not adrenal-specific ct, but did not see pheo, but no further eval for that at this time. Did not suppressed renin and would like further labs prior to visit with Dr. Janet.     Future Appointments   Date Time Provider Department Center   04/20/2023 12:30 PM NORTH [place] 1 [facility] MRI [facility] Imaging   05/24/2023 11:30 AM Sudie Chain, MD [facility] CARD [facility]   07/02/2023 10:10 AM Oneil Renny Right, MD [facility] NEUR [facility]   07/16/2023 10:00 AM Prentice Rome Janet, DO [facility]

## 2023-04-17 NOTE — Telephone Encounter (Signed)
 Response generated from econsult encounter.

## 2023-04-19 NOTE — Telephone Encounter (Signed)
 Patient called in today stating he sent a message in my chart about patient having weird stool since patient has had abdominal pain this morning and they were concerned , so he need kristin input on what he should do .

## 2023-04-20 ENCOUNTER — Inpatient Hospital Stay: Admit: 2023-04-20 | Discharge: 2023-04-25 | Payer: PRIVATE HEALTH INSURANCE

## 2023-04-20 DIAGNOSIS — R232 Flushing: Secondary | ICD-10-CM

## 2023-04-20 MED ORDER — gadobutrol (GADAVIST) 1 mmol/1 mL IV solution 6 mL
1 | Freq: Once | INTRAVENOUS | Status: AC | PRN
Start: 2023-04-20 — End: 2023-04-20
  Administered 2023-04-20: 19:00:00 via INTRAVENOUS

## 2023-04-20 NOTE — Progress Notes (Signed)
 Message reviewed with Dr. 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  Recommend patient be admitted for colon prep day prior to procedure, to avoid syncope due to dehydration.    Letter generated and routed to patient and [facility] via EPIC.

## 2023-04-24 ENCOUNTER — Other Ambulatory Visit: Admit: 2023-04-24 | Payer: PRIVATE HEALTH INSURANCE

## 2023-04-24 DIAGNOSIS — R404 Transient alteration of awareness: Secondary | ICD-10-CM

## 2023-04-24 LAB — ZINC: Zinc: 74 ug/dL (ref 44–115)

## 2023-04-24 LAB — METANEPHRINES, FRACTIONATED, PLASMA
Metanephrine: 44.4 pg/mL (ref 0.0–88.0)
Normetanephrine: 111.1 pg/mL (ref 0.0–210.1)

## 2023-04-24 LAB — FOLATE: Folic Acid: >24.8 ng/mL — ABNORMAL HIGH (ref 5.90–24.80)

## 2023-04-24 LAB — IRON STUDIES
% Iron Saturation: 21.8 % (ref 15.0–55.0)
Iron: 77 ug/dL (ref 50–212)
TIBC: 354 ug/dL (ref 261–462)

## 2023-04-24 LAB — METANEPHRINES, URINE, 24 HOUR
Metanephrine, Urine: 33 ug/L
Metanephrines, 24H Ur: 86 ug/(24.h) (ref 58–276)
Normetanephrine, 24H Ur: 221 ug/(24.h) (ref 156–729)
Normetanephrine, Ur: 85 ug/L

## 2023-04-24 LAB — CATECHOLAMINES, 24 HOUR URINE
Dopamine , 24H Ur: 109 ug/(24.h) (ref 0–510)
Dopamine, Ur: 42 ug/L
Epinephrine, 24H Ur: <8 ug/(24.h) (ref 0–20)
Epinephrine, UR: <3 ug/L
Norepinephrine, 24H Ur: <39 ug/(24.h) (ref 0–135)
Norepinephrine, Ur: <15 ug/L

## 2023-04-24 LAB — CBC
Hematocrit: 41.4 % (ref 38.5–50.0)
Hemoglobin: 14.2 g/dL (ref 13.2–17.1)
MCH: 29.3 pg (ref 27.0–33.0)
MCHC: 34.3 g/dL (ref 32.0–36.0)
MCV: 85.7 fL (ref 80.0–100.0)
MPV: 7.6 fL (ref 7.5–11.5)
Platelets: 261 10*3/uL (ref 140–400)
RBC: 4.83 10*6/uL (ref 4.20–5.80)
RDW: 12.9 % (ref 11.0–15.0)
WBC: 3.7 10*3/uL — ABNORMAL LOW (ref 3.8–10.8)

## 2023-04-24 LAB — DIFFERENTIAL
Basophils Absolute: 0 /uL (ref 0–200)
Basophils Relative: 0 % (ref 0.0–1.0)
Eosinophils Absolute: 0 /uL — ABNORMAL LOW (ref 15–500)
Eosinophils Relative: 0 % (ref 0.0–8.0)
Lymphocytes Absolute: 1813 /uL (ref 850–3900)
Lymphocytes Relative: 49 % — ABNORMAL HIGH (ref 15.0–45.0)
Monocytes Absolute: 259 /uL (ref 200–950)
Monocytes Relative: 7 % (ref 0.0–12.0)
Neutrophils Absolute: 1628 /uL (ref 1500–7800)
Neutrophils Relative: 44 % (ref 40.0–80.0)
PLT Morphology: NORMAL

## 2023-04-24 LAB — COMPREHENSIVE METABOLIC PANEL
ALT: 24 U/L (ref 7–52)
AST: 20 U/L (ref 13–39)
Albumin: 5.1 g/dL (ref 3.5–5.7)
Alkaline Phosphatase: 61 U/L (ref 36–125)
Anion Gap: 9 mmol/L (ref 3–16)
BUN: 8 mg/dL (ref 7–25)
CO2: 28 mmol/L (ref 21–33)
Calcium: 9.6 mg/dL (ref 8.6–10.3)
Chloride: 104 mmol/L (ref 98–110)
Creatinine: 0.85 mg/dL (ref 0.60–1.30)
EGFR: >90
Glucose: 99 mg/dL (ref 70–100)
Osmolality, Calculated: 290 mosm/kg (ref 278–305)
Potassium: 4.2 mmol/L (ref 3.5–5.3)
Sodium: 141 mmol/L (ref 133–146)
Total Bilirubin: 0.7 mg/dL (ref 0.0–1.5)
Total Protein: 7.6 g/dL (ref 6.4–8.9)

## 2023-04-24 LAB — VITAMIN B12: Vitamin B-12: 1051 pg/mL — ABNORMAL HIGH (ref 180–914)

## 2023-04-24 LAB — HEPATIC FUNCTION PANEL
ALT: 24 U/L (ref 7–52)
AST: 20 U/L (ref 13–39)
Albumin: 5.1 g/dL (ref 3.5–5.7)
Alkaline Phosphatase: 61 U/L (ref 36–125)
Bilirubin, Direct: 0.14 mg/dL (ref 0.00–0.40)
Bilirubin, Indirect: 0.56 mg/dL (ref 0.00–1.10)
Total Bilirubin: 0.7 mg/dL (ref 0.0–1.5)
Total Protein: 7.6 g/dL (ref 6.4–8.9)

## 2023-04-24 LAB — ALDOSTERONE/RENIN RATIO
Aldosterone/Renin Ratio: 3.2 (ref 0.0–30.0)
Aldosterone: 2.3 ng/dL (ref 0.0–30.0)
Renin Activity: 0.716 ng/mL/h (ref 0.167–5.380)

## 2023-04-24 LAB — VITAMIN B6: Vitamin B6: 70.6 ug/L — ABNORMAL HIGH (ref 3.4–65.2)

## 2023-04-24 LAB — METHYLMALONIC ACID, SERUM: Methylmalonic Acid: 105 nmol/L (ref 0–378)

## 2023-04-24 LAB — HEMOGLOBIN A1C: Hemoglobin A1C: 5.3 % (ref 4.0–5.6)

## 2023-04-24 LAB — PTH, INTACT: PTH: 29 pg/mL (ref 12.0–88.0)

## 2023-04-25 ENCOUNTER — Ambulatory Visit: Admit: 2023-04-25 | Discharge: 2023-04-30 | Payer: PRIVATE HEALTH INSURANCE | Attending: Medical

## 2023-04-25 DIAGNOSIS — R194 Change in bowel habit: Secondary | ICD-10-CM

## 2023-04-25 MED ORDER — polyethylene glycol (GLYCOLAX) 17 gram/dose powder
17 | Freq: Every day | ORAL | 0 refills | 14.00000 days | Status: AC
Start: 2023-04-25 — End: ?

## 2023-04-25 NOTE — Progress Notes (Signed)
 [facility]    New Patient Visit  Physician Assistant    CONSULT - Reason for Visit:  constipation, abdominal pain.  Requesting Physician: Kristin Spitznagel PA.    CHIEF COMPLAINT:    Chief Complaint   Patient presents with    Constipation    Abdominal Pain      HISTORY OF PRESENT ILLNESS:   David Jordan is a 38 y.o. male who is referred for the following:     Patient was referred from his PCP for abdominal discomfort.   Has had pretty extensive abdominal imaging in the past month which showed moderate stool burden but was otherwise normal.     Patient complains of abdominal pain, which feels like a needle is stabbing him. Starts in the upper abdomen, but then will move around his stomach. Started around 7 months ago- which coincided with some issues w/ syncope, elevated / low BP. Pain is constant. He has tried changes his diet to cut certain things out. He eliminated gluten at the end of December and thinks this has helped a little. He thinks eliminating gluten has helped him move his bowels better and feel a little better.  Also noticed a change in bowel habits in past year w/ constipation. Last year, would go days, up to 5 days without a BM. Wasn't noticing worsening pain w/o a BM, and no improvement in the past w/ a BM.   In the past few weeks, has been moving his bowels daily.   He denies hematochezia, but stools have been darker / green at times - not black/tarry. He also sees mucus in the stool.   He now feels like his butt doesn't go back in after a BM.   He gets some nausea, no heartburn, indigestion.   When this all started-  lost 20 lb in 3 weeks, and sine then has maintained but not gained weight back.     He has been taking Dulcolax daily starting June 2024, but stopped taking end of 2024. Has not taken any laxatives this month. Was also taking metamcuil and quit taking this. Has not done Miralax .   Has not taken anything else for his symptoms.  Does feel like he is getting  enough fiber in his diet.     3 Pat Uncles w/ colon cancer  Pat GM w/ colon cancer  Unknown if genetic testing donw  Pat Aunt w/ UC    No tobacco, marijuana or alcohol use  No nsaid use     No increased stressors, anxiety etc     Patient was working w/ cardiology on his syncopal episodes. Was initial concern for POTS but notes this was r/o. Dr Orlena wrote a letter that she would recommend admission the day prior to procedure. Patient notes he did prep prior to last colonoscopy at home w/o issue.     Past Medical History:   Past Medical History:   Diagnosis Date    Allergy     Seasonal    Bilateral arm weakness     Chronic constipation July 2024    Headache     Memory loss     Migraines June 2024    Seizure-like activity (CMS-HCC) June 2024    ? 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    cyanocobalamin  Take 1 tablet (1,000 mcg total) by mouth daily.    ibuprofen Take 1 tablet (200 mg total) by mouth every 6 hours as needed for Pain.    multivitamin Take 1 tablet by mouth daily.     No current facility-administered medications for this visit.     ALLERGIES:  Codeine and Shellfish derived    SOCIAL HISTORY:    Social History     Socioeconomic History    Marital status: Married     Spouse name: Not on file    Number of children: Not on file    Years of education: Not on file    Highest education level: Not on file   Occupational History    Not on file   Tobacco Use    Smoking status: Never    Smokeless tobacco: Never   Vaping Use    Vaping status: Never Used   Substance and Sexual Activity    Alcohol use: Not Currently    Drug use: Never    Sexual activity: Yes     Partners: Female     Birth control/protection: I.U.D.   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    Seat Belt Yes   Social History Narrative    Not on file     Social Drivers of Health     Financial Resource Strain: Not on file   Food Insecurity: No Food Insecurity (10/21/2022)    Hunger Vital Sign     Worried About Running Out of Food in the Last Year: Never true     Ran Out of Food in the Last Year: Never true   Transportation Needs: No Transportation Needs (10/21/2022)    PRAPARE - Therapist, Art (Medical): No     Lack of Transportation (Non-Medical): No   Physical Activity: Not on file   Stress: Not on file   Social Connections: Not on file   Intimate Partner Violence: Not At Risk (03/05/2023)    Humiliation, Afraid, Rape, and Kick questionnaire     Fear of Current or Ex-Partner: No     Emotionally Abused: No     Physically Abused: No     Sexually Abused: No   Housing Stability: Low Risk  (10/21/2022)    Housing Stability Vital Sign     Unable to Pay for Housing in the Last Year: No     Number of Times Moved in the Last Year: 0     Homeless in the Last Year: No       FAMILY HISTORY:   Family History   Problem Relation Age of Onset    Diabetes Father     Heart disease Father     Cancer Paternal Uncle 44        Colorectal, as adult over 1. Still alive    Colon Cancer Paternal Uncle     Rectal Cancer Paternal Uncle     Colon Cancer Paternal Uncle         unknown age    Ulcerative colitis Paternal Aunt     Colon Cancer Paternal Uncle 64    Kidney disease Neg Hx  Stroke Neg Hx      ASSESSMENTS:   BODY HABITUS AND NUTRITION STATUS:    Body mass index is 20.23 kg/m.    REVIEW OF SYSTEMS:  (For the following ROS, pertinent positives are in bold; pertinent negatives are in italics.)  CONSTITUTIONAL: fevers, chills, sweats, weight loss, fatigue  ENMT: voice changes, sore throat  CV: chest pain, palpitations  RESP: cough, dyspnea  GI: see HPI  GU:  hematuria, dysuria, frequency, urgency  MSKL: pain, muscle weakness, joint pain  SKIN: rash, pain, pruritis  HEME/LYMPH: bruising, bleeding  NEURO: headache, dizziness  PSYCH: depression, anxiety    PHYSICAL EXAM:     Vital Signs:  Blood pressure 134/83, pulse 69, height 5' 9 (1.753 m), weight 137 lb (62.1 kg), SpO2 99%.    CONSTITUTIONAL: WD/WN, in NAD  NECK: supple, symmetrical, trachea midline  LUNGS: no increased work of breathing   CARDIOVASCULAR: RRR  ABDOMEN: soft, NT, ND, no rebound or guarding, no hepatomegaly, no masses  MUSCULOSKELETAL: no LE edema  SKIN: no rashes and no jaundice  HEMATOLOGIC: no bleeding or bruising   NEURO: normal gait, aaox3    LABS:   Lab Results   Component Value Date    WBC 3.7 (L) 04/24/2023    HGB 14.2 04/24/2023    HCT 41.4 04/24/2023    MCV 85.7 04/24/2023    PLT 261 04/24/2023     Lab Results   Component Value Date    GLUCOSE 99 04/24/2023    BUN 8 04/24/2023    CO2 28 04/24/2023    CREATININE 0.85 04/24/2023    K 4.2 04/24/2023    NA 141 04/24/2023    CL 104 04/24/2023    CALCIUM 9.6 04/24/2023     Lab Results   Component Value Date    ALKPHOS 61 04/24/2023    ALKPHOS 61 04/24/2023    ALT 24 04/24/2023    ALT 24 04/24/2023    AST 20 04/24/2023    AST 20 04/24/2023    BILITOT 0.7 04/24/2023    BILITOT 0.7 04/24/2023    ALBUMIN 5.1 04/24/2023    ALBUMIN 5.1 04/24/2023    BILIDIRECT 0.14 04/24/2023    PROT 7.6 04/24/2023    PROT 7.6 04/24/2023      Lab Results   Component Value Date    VITD25H 30.4 10/16/2022      Lab Results   Component Value Date    VITAMINB12 1,051 (H) 04/24/2023      No results found for: FERRITIN  Lab Results   Component Value Date    IRON 77 04/24/2023    TIBC 354 04/24/2023     Lab Results   Component Value Date    TSH 1.73 11/09/2022     Lab Results   Component Value Date    CRP <1.0 (L) 10/21/2022     No results found for: CALPROTFEC    IMAGING and ENDOSCOPY:   Abdominal MRI (03/2023):    FINDINGS:    Liver: Normal morphology and signal intensity.  There are at least 4 benign cysts in the liver, the largest measuring 2.4 cm. There is a 1.1 cm benign cavernous hemangioma in the mid lateral right liver. No suspicious focal liver lesions.    Biliary Tree/Gallbladder: Normal gallbladder. No biliary dilatation.    Spleen: Normal size. There is a 2.2 cm benign cyst in the anterior spleen and a 1.1 cm benign cyst in the posterior inferior spleen.    Pancreas: Normal.  Adrenal Glands: Normal.    Kidneys/Ureter: Normal size with symmetric enhancement. No hydronephrosis. 2 subcentimeter benign cysts in the left kidney.    Gastrointestinal Tract: Limited evaluation. Normal caliber without wall thickening.    Lymphatics: No lymphadenopathy.    Vasculature: Normal.    Peritoneum/Retroperitoneum: No ascites or fluid collections. No retroperitoneal mass lesions.    Abdominal Wall/Soft Tissues: Normal.    Osseous Structures: Normal marrow signal. No focal abnormalities.    Lower Chest: Limited evaluation. No abnormalities.      Impression   IMPRESSION:    1.  No evidence for pheochromocytoma.  2.  Incidental findings detailed above.      Abdominal US  (03/2023):    Impression   IMPRESSION:    Normal abdominal ultrasound except for above detailed incidental findings.      CT 03/2023):  Impression   IMPRESSION:    No acute abnormality in the abdomen or pelvis.      KUB (03/2023):    Impression   IMPRESSION:     1.  Nonobstructive bowel gas pattern.  2.  Moderate colonic stool burden.        ASSESSMENT AND PLAN:    Dredyn Gubbels is a 38 y.o. male who is referred for the following:     1. Change in bowel habits        2. Chronic abdominal pain        3. Constipation, unspecified constipation type        4. Nausea        5. Family history of colon cancer          Patient has been experiencing an unexplained change in bowel habits over the past 6 months with constipation, abdominal pain.  He has had extensive abdominal imaging including ultrasound, CT, and MRI, which, aside  from showing moderate stool burden, has otherwise been unremarkable.    Patient does have a strong family history of colon cancer, including 3 uncles and a grandmother, all on the same side.  With his change in bowel habits and family history, I would recommend a colonoscopy to further investigate his symptoms.  We discussed pursuing an EGD at the time of colonoscopy to evaluate for PUD, celiac, H. pylori, or alternative etiology for his abdominal pain, as we discussed colonoscopy is often a low yield test for pain.    In the interim, I would like him to start daily MiraLAX .  While he has seen some benefit with being gluten-free, in order to exclude celiac, I recommended that he eat some gluten between now and the procedures.    He had undergone cardiac workup for syncope which overall was not revealing for an etiology.  He has a letter from cardiology discussing admission the day prior to procedure given syncopal history.  Patient tolerated an outpatient bowel prep several months ago without issues/syncope/dehydration.  GoLytely  prep is mostly isotonic and should not cause significant dehydration.  Discussed with patient that I will discuss further with cardiology if this is something we need to pursue along with risk, benefits of doing this inpatient versus outpatient.  He understands that an inpatient admission would require more care coordination that could delay timing until procedure.    Plan:  - Colonoscopy / EGD w/ [facility], split dose GoLytely   - Recommended he consume some gluten leading up to procedures   - Start Miralax  daily    Blood Thinners: no   Pacemaker or AICD: no   Dialysis Patient: no  Follow up: after procedures     Damien Hamilton, PA-C  [facility]   [phone]

## 2023-04-25 NOTE — Patient Instructions (Signed)
-   I will consult w/ Dr Sandra Cockayne re: admission vs. Doing prep as outpatient - esp since labs have not shown signs of dehydration   - For now, will schedule as outpatient   - Colonoscopy / EGD

## 2023-04-25 NOTE — Telephone Encounter (Signed)
 Patient is scheduled for a COLO/EGD @ [facility] w/ [facility] w/ Dr. AMINTA on 05/17/23. Pt instructed to arrive at 11AM procedure is scheduled at 12:30PM. Prep instructions for GOLYTLEY SPLIT DOSE mailed to verified address.    0 Anticoagulants  0 GLP-1 meds    Rx e-scribed to [facility] [id number] - [place], [place] - [address]     Feras Gardella

## 2023-05-07 NOTE — Telephone Encounter (Signed)
 Labs normal. Will await results of GI workup in process.

## 2023-05-07 NOTE — Telephone Encounter (Signed)
 Called patient and gave him David Jordan message, no further questions.

## 2023-05-09 NOTE — Progress Notes (Signed)
 Voice-mail left for Pt trying to confirm colo appointment on 2/13 with arrival time of 1100. Left details to pick up prep as soon as possible, follow the clear liquid diet, to have Pt arrange a ride for after procedure with responsible adult, and call back number of [phone] for any questions about appointment.

## 2023-05-10 NOTE — Telephone Encounter (Signed)
 Patient called in and stated that he needed to cancel his procedure on the 2/13 with Dr. Margret Chance. Patient stated that he had the procedure done at a different gastro health location.   Appointment has been canceled.

## 2023-05-15 NOTE — Telephone Encounter (Signed)
 Procedure has been cancelled - patient went outside of [facility]

## 2023-05-15 NOTE — Telephone Encounter (Signed)
-----   Message from Dr. ONEIDA Chain sent at 04/26/2023  9:34 AM EST -----  Regarding: RE: Colonoscopy/EGD  My opinion is unchanged. I understand you bed situation.  I will leave the final decision to you all.  ----- Message -----  From: Damien VEAR Hamilton, PA  Sent: 04/25/2023  11:10 AM EST  To: Sudie Chain, MD; Saddie Everitt Rao, MD  Subject: Colonoscopy/EGD                                  Dr Chain  I currently have Mr. David Jordan scheduled for a colonoscopy/EGD on 05/17/23 w/ Dr Everitt Rao to evaluate his symptoms.    I saw your letter about inpatient admission for prep given his h/o syncope and + TILT test.     Realistically, this is somewhat challenging to do given bed shortages, etc. His labs when he has had syncopal episodes before have not been consistent with dehydration. Additionally, he did a 2-day prep at home a couple months ago (when he was originally scheduled at [facility]) without issue, and the GoLytely  really shouldn't dehydrate him based on its properties. We do give patients fluid on day of procedure once their IV is in.     I certainly want to do what is best/safest for the procedure, but wanted to further the risk/benefit conversation on this.     Thanks for your time  Damien

## 2023-05-15 NOTE — Telephone Encounter (Signed)
-----   Message from Dr. MARLA Everitt Rao sent at 04/25/2023 11:29 AM EST -----  Regarding: RE: Colonoscopy/EGD  Inpatient procedures are more challenging and most of the time unsatisfactory. He would also need to be approved by his insurance for this. I have had insurances deny this route for patients.     Outpatient is best if possible    Saddie Everitt Rao, MD  ----- Message -----  From: Damien VEAR Hamilton, PA  Sent: 04/25/2023  11:10 AM EST  To: Sudie Chain, MD; Saddie Everitt Rao, MD  Subject: Colonoscopy/EGD                                  Dr Chain  I currently have Mr. Kissoon scheduled for a colonoscopy/EGD on 05/17/23 w/ Dr Everitt Rao to evaluate his symptoms.    I saw your letter about inpatient admission for prep given his h/o syncope and + TILT test.     Realistically, this is somewhat challenging to do given bed shortages, etc. His labs when he has had syncopal episodes before have not been consistent with dehydration. Additionally, he did a 2-day prep at home a couple months ago (when he was originally scheduled at [facility]) without issue, and the GoLytely  really shouldn't dehydrate him based on its properties. We do give patients fluid on day of procedure once their IV is in.     I certainly want to do what is best/safest for the procedure, but wanted to further the risk/benefit conversation on this.     Thanks for your time  Damien

## 2023-05-17 ENCOUNTER — Encounter: Attending: Gastroenterology

## 2023-05-24 ENCOUNTER — Ambulatory Visit: Admit: 2023-05-24 | Discharge: 2023-05-28 | Payer: PRIVATE HEALTH INSURANCE

## 2023-05-24 DIAGNOSIS — R42 Dizziness and giddiness: Secondary | ICD-10-CM

## 2023-05-24 NOTE — Progress Notes (Signed)
 [facility] HEART & VASCULAR   HEART FAILURE / CARDIOLOGY PATIENT VISIT      [name]  [id number]  05/24/2023  11:54 AM    HPI:           David Jordan is a 38 y.o. White or Caucasianmale who presents today as a new patient. Presents with episodes 3-4 times a week which start with flushing, than nausea,tham chest and headache. Wife noticed jerky eye movements. BP checked during it is mildly high 145 /80 with pulse of 79. After the episode the BP was 117/68. Current symptoms include: chest pain, dyspnea, fatigue, near-syncope, and palpitations. He denies syncope. He states he is compliant most of the time with his medications. He states he is compliant most of the time with his diet.  05/24/23.  Patient presents for today for follow-up.  He had his colonoscopy with gastro health with soft prep and his colonoscopy was negative.  He did pass out once 3 months ago and has been done since fine since then he takes a lot of of get Gatorade all day long but he has not been wearing teds.      REVIEW OF SYSTEMS:     General ROS: all negative  Psychological ROS: all negative  Ophthalmic ROS: negative  ENT ROS: all negative  Allergy and Immunology ROS: negative  Hematological and Lymphatic ROS: all negative  Endocrine ROS: all  negative  Respiratory ROS: all negative  Cardiovascular ROS: all negative  Gastrointestinal ROS: all negative  Genito-Urinary ROS: no dysuria, trouble voiding, or hematuria  Musculoskeletal ROS: all negative  Neurological ROS: all negative  Dermatological ROS: all negative    PMH:     Past Medical History:   Diagnosis Date    Allergy     Seasonal    Bilateral arm weakness     Chronic constipation July 2024    Headache     Memory loss     Migraines June 2024    Seizure-like activity (CMS-HCC) June 2024    ? seizure vs PNES per neuro, normal EEG       Past Surgical History    Past Surgical History:   Procedure Laterality Date    PR EEG PHYS/QHP 2-12 HR WITH VEEG  09/22/2022    WISDOM TOOTH EXTRACTION          SOCIAL HISTORY:    Social History     Socioeconomic History    Marital status: Married     Spouse name: Not on file    Number of children: Not on file    Years of education: Not on file    Highest education level: Not on file   Occupational History    Not on file   Tobacco Use    Smoking status: Never    Smokeless tobacco: Never   Vaping Use    Vaping status: Never Used   Substance and Sexual Activity    Alcohol use: Not Currently    Drug use: Never    Sexual activity: Yes     Partners: Female     Birth control/protection: I.U.D.   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    Seat Belt Yes   Social History Narrative    Not on file     Social Drivers of Psychologist, [employer]  Strain: Not on file   Food Insecurity: No Food Insecurity (10/21/2022)    Hunger Vital Sign     Worried About Running Out of Food in the Last Year: Never true     Ran Out of Food in the Last Year: Never true   Transportation Needs: No Transportation Needs (10/21/2022)    PRAPARE - Therapist, Art (Medical): No     Lack of Transportation (Non-Medical): No   Physical Activity: Not on file   Stress: Not on file   Social Connections: Not on file   Intimate Partner Violence: Not At Risk (03/05/2023)    Humiliation, Afraid, Rape, and Kick questionnaire     Fear of Current or Ex-Partner: No     Emotionally Abused: No     Physically Abused: No     Sexually Abused: No   Housing Stability: Low Risk  (10/21/2022)    Housing Stability Vital Sign     Unable to Pay for Housing in the Last Year: No     Number of Times Moved in the Last Year: 0     Homeless in the Last Year: No           MEDICATIONS:     Current Outpatient Medications on File Prior to Visit   Medication Sig Dispense Refill    cholecalciferol, vitamin D3, 50 mcg (2,000 unit) Tab Take 2,000 Units by mouth daily.      cyanocobalamin  (VITAMIN B-12) 1000 MCG  tablet Take 1 tablet (1,000 mcg total) by mouth daily. 30 tablet 0    ibuprofen (MOTRIN) 200 MG tablet Take 1 tablet (200 mg total) by mouth every 6 hours as needed for Pain.      multivitamin (THERAGRAN) tablet Take 1 tablet by mouth daily.      polyethylene glycol (GLYCOLAX ) 17 gram/dose powder Take 17 g by mouth daily. PATIENT IS TO FOLLOW THE COLO PREP INSTRUCTIONS according to the instructions outlined in your prep letter from [facility]. (Patient not taking: Reported on 05/24/2023) 255 g 0     No current facility-administered medications on file prior to visit.             ALLERGIES    Allergies   Allergen Reactions    Codeine Hives and Nausea And Vomiting     Hives and Fever    Shellfish Derived Swelling and Rash           PHYSICAL EXAMINATION:     Vitals:    05/24/23 1120   BP: 140/85   Pulse: 81   SpO2: 98%     Wt Readings from Last 3 Encounters:   05/24/23 139 lb (63 kg)   04/25/23 137 lb (62.1 kg)   04/20/23 130 lb (59 kg)         Constitutional:alert, no acute distress, well hydrated, appropriate for age.   Skin: normal turgor.  Head: atraumatic, normocephalic.   Eyes: no scleral injection.  Neck: supple.  Abdomen: normal BS  Neuro: non focal  Psych: affect and mood appropriate.   Pulmonary  Right Upper:  normal air entry, lungs clear to auscultation  no rales, rhonchi or wheezing  Left Upper: normal air entry, lungs clear to auscultation  no rales, rhonchi or wheezing  Right Lower: normal air entry, lungs clear to auscultation  no rales, rhonchi or wheezing  Left Lower: normal air entry, lungs clear to auscultation  no rales, rhonchi or wheezing  Cardiovascular  Neck Veins: No distension  JVP: not raised.  Palpation: apical impulse displaced  Location Apical Impulse: 5th intercostal space displaced laterally  S1: normal  S2: normal  Rhythm: regular rate and rhythm  Bilateral Lower Extremity Edema: none  Pulses poorly palpable      LABS:       Lab Results   Component Value Date    WBC 3.7 (L) 04/24/2023    HGB  14.2 04/24/2023    HCT 41.4 04/24/2023    PLT 261 04/24/2023    TRIG 81 09/22/2022    HDL 46 (L) 09/22/2022    ALT 24 04/24/2023    ALT 24 04/24/2023    AST 20 04/24/2023    AST 20 04/24/2023    NA 141 04/24/2023    K 4.2 04/24/2023    CL 104 04/24/2023    CREATININE 0.85 04/24/2023    BUN 8 04/24/2023    CO2 28 04/24/2023    TSH 1.73 11/09/2022    HGBA1C 5.3 04/24/2023       Last Echocardiogram report:    Results for orders placed during the hospital encounter of 09/21/22    Echo 2D Complete (TTE)    Narrative  * [facility]  Department of Cardiology*  [address]  [place], [place] [address]  [phone]    Transthoracic Echocardiogram    Patient:     David Jordan, Angert      Room:   134        Height: 69in  MR Number:   [id number]                DOB:    1986-03-26 Weight: 145lb  Account:     [id number]              Gender: M          BP:     114 / 70  Study Date:  09/21/2022              Age:    36         BSA:    1.76m^2  Referring physician:    Irven Schanz  Interpreting physician: Orlena Slocumb    PERFORMING   [facility]  SONOGRAPHER  Eliazar Dayhoff RDCS  ORDERING     [place], Jose  REFERRING    [place], [facility]   [place], Alex  ATTENDING    Rollene Fairy HAMS  ADMITTING    Rollene Fairy HAMS    ----------------------------------------------------------------------------    Procedure:ECHO 2D COMPLETE (TTE)   Order: Accession Number:[id number]    ----------------------------------------------------------------------------  Indications:      (syncope R55).    ----------------------------------------------------------------------------  PMH:  No prior cardiac history.    ----------------------------------------------------------------------------  Study data:  Height: 69in. 175.3cm. Weight: 145lb. 65.8kg.  Study status:  Routine.  Procedure:  A transthoracic echocardiogram was performed. Image  quality was good. Scanning was performed from the parasternal, apical,  and  subcostal acoustic windows.          Transthoracic echocardiogram.  M-mode,  complete 2D, complete spectral Doppler, and color Doppler.  Birthdate:  Patient birthdate: 06-Nov-1985.  Age:  Patient is 89year(s) old.  Sex:  Birth  gender: male.  Body mass index:  BMI: 21.4kg/m^2.  Body surface area:  BSA: 1.47m^2.  Blood pressure:     114/70  Patient status:  Inpatient.  Study  date:  Study date: 09/21/2022. Study time: 01:39 PM.  Location:  Echo  laboratory.    ----------------------------------------------------------------------------  Study Conclusions    - Left ventricle: The cavity size is normal. Wall thickness is normal.  Systolic function is normal. The estimated ejection fraction is 55-60%.  Wall motion is normal; there are no regional wall motion abnormalities.  Left ventricular diastolic function parameters are normal.  - Aortic valve: There is mild thickening.  - Mitral valve: The annulus is mildly calcified.  - Right ventricle: Systolic function is normal by objective interpretation.    ----------------------------------------------------------------------------  Cardiac Anatomy    Left ventricle:    - The cavity size is normal. Wall thickness is normal. Systolic function is  normal. The estimated ejection fraction is 55-60%. Wall motion is normal;  there are no regional wall motion abnormalities.  - The transmitral flow pattern is normal. The deceleration time of the early  transmitral flow velocity is normal. The pulmonary vein flow pattern is  normal. The tissue Doppler parameters are normal. Left ventricular  diastolic function parameters are normal.  Aorta:  Aortic root: The root is normal in size.  Aortic valve:    - TrileafletThe leaflets are normal thickness. There is mild thickening.  Mobility is not restricted. Velocity is within the normal range. There is  no stenosis. There is no regurgitation. The mean systolic gradient is 4mm  Hg. The ratio of LVOT to aortic valve peak velocity is 0.8.  The ratio of  LVOT to aortic valve mean velocity is 0.8.  Mitral valve:    - The annulus is mildly calcified. Mobility is not restricted. Inflow  velocity is within the normal range. There is no evidence for stenosis.  There is no regurgitation. The mean diastolic gradient is 2mm Hg. The peak  diastolic gradient is 3mm Hg.  Left atrium:  The atrium is normal in size.  Pulmonary artery:    - The main pulmonary artery is normal-sized. Systolic pressure was within  the normal range.  Main pulmonary artery:    -  Right ventricle:    - The cavity size is normal. Wall thickness is normal. Systolic function is  normal by objective interpretation.  Pulmonic valve:    - Velocity is within the normal range. There is no evidence for stenosis.  There is no regurgitation.  Tricuspid valve:    - The valve is structurally normal. Inflow velocity is within the normal  range. There is no regurgitation.  Right atrium:  The atrium is normal in size.  Pericardium:    - There is no pericardial effusion.  Systemic veins:  Inferior vena cava: The IVC is normal-sized.    ----------------------------------------------------------------------------  Measurements    Left ventricle            Value          Ref  EDD, LAX              (N) 4.5   cm       4.2 - 5.8  ESD, LAX              (N) 2.6   cm       2.5 - 4.0  EDD/bsa, LAX          (N) 2.5   cm/m^2   2.2 - 3.0  ESD/bsa, LAX          (N) 1.4   cm/m^2   1.3 - 2.1  FS, LAX               (N) 42    %  25 - 43  FS, LAX chord         (N) 42    %        25 - 43  IVS, ED               (N) 0.8   cm       0.6 - 1.0  ESD                   (N) 2.6   cm       2.5 - 4.0  ESD/bsa               (N) 1.4   cm/m^2   1.3 - 2.1  PW, ED                (N) 0.9   cm       0.6 - 1.0  IVS/PW, ED                0.89           ---------  EDV                   (N) 92    ml       62 - 150  ESV                   (N) 25    ml       21 - 61  EF                    (H) 73    %        52 - 72  EDV/bsa               (N)  51    ml/m^2   34 - 74  ESV/bsa               (N) 14    ml/m^2   11 - 31  SV, 1-p A2C               68    ml       ---------  SV/bsa, 1-p A2C           37.7  ml/m^2   ---------  SV, 1-p A4C               74    ml       ---------  SV/bsa, 1-p A4C           41    ml/m^2   ---------  E', lat ann, TDI      (N) 19.3  cm/sec   >=10.0  E/e', lat ann, TDI    (N) 5              <=13  E', med ann, TDI      (N) 12.0  cm/sec   >=7.0  E/e', med ann, TDI        8              ---------  E', avg, TDI              15.7  cm/sec   ---------  E/e', avg, TDI        (N) 6              <=14    LVOT  Value          Ref  Peak vel, S               1.09  m/sec    ---------  Mean vel, S               0.81  m/sec    ---------    Right ventricle           Value          Ref  EDD, LAX                  2.9   cm       ---------  TAPSE, MM             (N) 3.3   cm       >=1.7  S' lateral            (N) 19.9  cm/sec   >=9.5    Left atrium               Value          Ref  AP dim, ES            (N) 3.0   cm       3.0 - 4.0  AP dim index, ES      (N) 1.7   cm/m^2   1.5 - 2.3  Area ES, A4C          (N) 8     cm^2     <=20  Area/bsa ES, A4C          4.26  cm^2/m^2 ---------  SI dim, A2C               4.4   cm       ---------  Vol, S                (N) 20    ml       18 - 58  Vol/bsa, S            (L) 11    ml/m^2   16 - 34  Vol, ES, 1-p A4C      (L) 12    ml       18 - 58  Vol/bsa, ES, 1-p A4C  (L) 6     ml/m^2   12 - 37  Vol, ES, 1-p A2C      (N) 31    ml       18 - 58  Vol/bsa, ES, 1-p A2C  (N) 17    ml/m^2   11 - 43  Vol, ES, 2-p              20    ml       ---------  Vol/bsa, ES, 2-p      (L) 11    ml/m^2   16 - 34    Right atrium              Value          Ref  Area, ES, A4C         (N) 10    cm^2     10 - 18    Aortic valve              Value          Ref  Peak v, S  1.4   m/sec    ---------  Mean v, S                 1.01  m/sec    ---------  Mean grad, S              4     mm Hg    ---------  LVOT/AV, Vpeak  ratio      0.8            ---------  LVOT/AV, Vmean ratio      0.8            ---------    Mitral valve              Value          Ref  Mean v, D                 0.65  m/sec    ---------  Peak E                    0.93  m/sec    ---------  Peak A                    0.61  m/sec    ---------  VTI leaflet coapt         25.2  cm       ---------  Decel time                183   ms       ---------  Mean grad, D              2     mm Hg    ---------  Peak grad, D              3     mm Hg    ---------  Peak E/A ratio            1.5            ---------  E-VTI                     25.2  cm       ---------  A-VTI                     25.2  cm       ---------  VTI E/A                   1.0            ---------  Ann VTI                   25.2  cm       ---------    Pulmonic valve            Value          Ref  Peak v, S                 1.2   m/sec    ---------  Mean vel, S               0.84  m/sec    ---------  Accel time                165   ms       ---------    Aortic root  Value          Ref  Root diam             (N) 3.0   cm       2.5 - 3.5  Root diam/bsa             1.7   cm/m^2   ---------    Main pulmonary artery     Value          Ref  Mean grad                 3     mm Hg    ---------    Inferior vena cava        Value          Ref  Diam                  (N) 1.3   cm       <=2.1  Legend:  (L)  and  (H)  mark values outside specified reference range.    (N)  marks values inside specified reference range.    Reviewed and confirmed by    Orlena Slocumb  2024-06-20T14:51:38      From: 01/22/2023       To: 01/29/2023                       Total Days: 7     Quality of Tracing:  good      Basic Rhythm:  sinus        Maximum Heart Rate:  149  bpm     Minimum Heart Rate:  43 bpm         Average Heart Rate:  70 bpm     PVC's:  0          PVC Burden:  0%     ARRHYTHMIAS:     Ventricular Tachycardia:  none     SVT:  none     PAC's:  0     Symptoms:  There were 27 patient triggered events.       Patient reported  palpitations, chest discomfort, light headedness, and fatigue     CONCLUSION:     Baseline NSR  No sustained arrhythmia  Rare PAC PVCs.  All symptoms associated with normal sinus rhythm and sinus tachycardia.  Few patient triggered events.    Interpreting MD:  IVAR Orlena, MD    Orthostatic blood pressures in the office were negative for orthostasis or tachycardia.      Demographics:  Name: David Jordan   MRN: [id number]     Referring Physician: Slocumb Orlena, MD     Attending Physician: Alm SAILOR. Fredirick, MD  Assistant Physician: None     Summary of Procedures:  Head-up tilt table testing with nitroglycerin  challenge for syncope and pre-syncope (06339)     Preprocedure Diagnosis:  Syncope, unclear etiology     Indication:  Syncope, unclear etiology     Post-Procedure Diagnosis:  Vasodepressor syncope     Briefly, patient with recurrent episodes of dizziness with unclear etiology. He is referred for elective head up tilt table testing.     Description of Procedure:  Informed consent was obtained from the patient prior to the procedure after risks and benefits were explained in detail. Patient was brought to the [facility] in the postabsorptive, non-sedated state. Peripheral intravenous access was established. Continuous electrocardiographic, SaO2, and  blood pressure monitoring was initiated, and the restraining straps were applied to secure the patient on the tilt table.     Following positioning of the patient, supine vital signs were recorded. Baseline in the supine position was BP 131/93 mmHg (HR 82 bpm). The patient was raised to 70 degrees head-up tilt. Immediately after position change, the heart rate accelerated to 86 bpm and the blood pressure was 119/83 mm Hg. Three minutes after head-up tilt, the heart rate was 101 bpm and the blood pressure was 124/89 mm Hg. The patient remained at a head-up tilt for 10 minutes without development of symptoms. The maximum  heart rate during this time was 103 bpm associated with a blood pressure of 109/75 mm Hg at 5 minutes. The minimum blood pressure was 109/75 mm Hg associated with a heart rate of 103 bpm at 7 minutes.      The patient was then given sublingual nitroglycerin  and observed for another 10 minutes. The patient developed symptoms of dizziness, nausea and clamminess during the test at 5 minutes following administration of nitroglycerin . At the time of the symptoms, the heart rate was 155 bpm and the blood pressure was 103/86 mm Hg. The patient's symptoms progressed to complete loss of consciousness, at which time he  was immediately returned to supine position. The next available set of vial signs following the syncopal event was a heart rate of 106 bpm and a blood pressure of 88/60 mm Hg.     The test was stopped because of the development of symptoms. The patient was returned to a supine position.      Specimens removed: None      Complications: None      Estimated Blood Loss: None      Anesthesia: None     Summary: (positive)  1)  Positive inducible syncope and reproduction of symptoms  2)  Positive vasodepressor response consistent with vasodepressor syndrome  3)  No evidence of postural orthostatic tachycardia syndrome (POTS)  4)  Findings consistent with vasodepressor syndrome     Recommendations:  1)  Aggressive life style modification, including increased oral fluid intake, and avoidance of caffeine and alcohol.  Consider additional salt supplementation.  2)  Precautionary measures with symptoms, including sitting or lying down when safe to do so  3)  No driving for minimum 3 months following last syncopal episode  4)  Consider thigh high Jobst compression stockings         ASSESMENT:               Laydon Martis is a 38 y.o. White or Caucasian male who presents as a new patient to our clinic today.    Vasodepressor syndrome   No POTS      PLAN:     Recommendations:  1)  Aggressive life style modification,  including increased oral fluid intake, and avoidance of caffeine and alcohol.  Consider additional salt supplementation.  2)  Precautionary measures with symptoms, including sitting or lying down when safe to do so  3)  No driving for minimum 3 months following last syncopal episode  4)  Consider thigh high Jobst compression stockings  5.  Blood pressure log  Return to clinic in 6 months      Patient care, labs, test results discussed in detail with the patient during the visit.        Sudie Chain M.D. [facility]  Professor of Medicine.   Medical director, [facility]  Division of  cardiovascular disease  [employer] [facility]  [facility].   05/24/2023    Medical Decision Making:   The following items were considered in medical decision making:   Review medicines.  Review / order clinical lab tests   Review / order radiology tests /echocardiogram   Review outside records.  Review hospitalizations.  Review / order other diagnostic tests/interventions.      This note was completely edited, written and reviewed by me and consists of information cut and pasted from the my most recent visit, my smart phrases and other Epic tools. I have personally reviewed all aspects of this note to at least include reviewing this patient's chart and problem list, updating the history, physical exam, lab and procedure results, and assessment and plan as detailed above and below.  As such this visit note reflects my current evaluation and management for this patient.              Answers submitted by the patient for this visit:  Cardiology and Heart Failure ROS Questionnaire (Submitted on 02/20/2023)  Appetite Loss: Yes  Chills: Yes  Excessive sweating: No  Fever: No  Fatigue: Yes  Night sweats: Yes  Weight gain: No  Weight loss: Yes  Blurred vision: Yes  Discharge: Yes  Double vision: No  Eye pain : No  Photophobia/Light sensitive: No  Redness: No  Left eye vision loss: No  Right eye vision  loss: No  Visual disturbances (i.e. blurred vision): Yes  Visual halos: No  Cough: No  Coughing blood: No  Shortness of breath: No  Sleep disturbances caused by breathing complications: No  Snoring: No  Production of sputum: No  Wheezing: No  Changes in nail beds: No  Skin discoloration: No  Skin dryness: No  Skin flushing: Yes  Itching: Yes  Poor wound healing: Yes  Rash: Yes  Skin cancer: No  Lesions: No  Unusual hair distribution: No  Congestion: No  Ear discharge: No  Ear pain: No  Hearing loss: No  Hoarseness: No  Nosebleeds: No  Painful to swallow: No  Sore throat: No  Stridor: No  Tinnitus (ringing in ear): No  Chest pain: Yes  Pain in your leg that occurs when you walk or exercise.: Yes  Bluish/grayish skin discoloration of mucous membranes: No  Unable to catch your breath during physical activity.: No  Irregular heartbeats: Yes  Leg swelling: No  Near-fainting: Yes  Shortness of breath while laying down. : No  Palpitations: Yes  Waking up in the night due to shortness of breath. : No  Fainting or loss of consciousness: No  Intolerance of cold : No  Intolerance of heat: No  Excessive thirst or drinking of fluids.: No  Excessive hunger: No  Excessive urination: No  Adenopathy (swollen lymph nodes): No  Bleeding: No  Prone to bruising and bleeding easily : No  Arthritis: No  Back pain: Yes  Falls: No  Gout: No  Joint pain: No  Joint swelling: No  Muscle cramps: No  Muscle weakness: No  Myalgia (muscle pain/soreness): No  Neck pain: Yes  Stiffness: No  Abdominal bloating: No  Abdominal pain: Yes  Anorexia: No  Changes in bowel habit: Yes  Bowel incontinence: No  Constipation: Yes  Diarrhea: No  Difficulty swallowing food or liquid: No  Excessive appetite: No  Flatus (gas): No  Heartburn: No  Vomiting blood : No  Blood in stool: No  Hemorrhoids: No  Jaundice: No  Black  tarry stool: No  Nausea: Yes  Vomiting: No  Bladder incontinence: No  Reduced sex drive: Yes  Discomfort or painful urination: No  Flank pain:  No  Frequent urination: No  Genital sore: No  Blood in urine: No  Difficulty starting or maintaining a urine stream: No  Incomplete emptying: No  Frequently waking up at night to urinate: No  Pelvic pain: No  Urgency to urinate: No  Inability to speak due to damaged vocal cords. : No  Brief paralysis: No  Difficulty concentrating: Yes  Disturbances in coordination: Yes  Daytime sleepiness: Yes  Dizziness: Yes  Weakness in specific regions of the body: No  Generalized weakness (weakness throughout the body) : Yes  Headaches: Yes  Light-headedness: Yes  Loss of balance: Yes  Numbness: Yes  Tingling sensations: Yes  Seizures: No  Sensory changes: Yes  Tremors: No  Vertigo: No  Altered mental status : Yes  Depression: No  Hallucinations: Yes  Hypervigilance (state of increased alertness and anxiety) : Yes  Insomnia: Yes  Memory loss: Yes  Nervous or anxious: Yes  Substance abuse: No  Thoughts of suicide: No  Thoughts of violence: No  Environmental allergies: Yes  HIV exposure: No  Hives: No  Persistent infections: No      Answers submitted by the patient for this visit:  Cardiology and Heart Failure ROS Questionnaire (Submitted on 05/23/2023)  Appetite Loss: No  Chills: Yes  Excessive sweating: Yes  Fever: No  Fatigue: Yes  Night sweats: Yes  Weight gain: No  Weight loss: Yes  Blurred vision: Yes  Discharge: Yes  Double vision: No  Eye pain : No  Photophobia/Light sensitive: No  Redness: No  Left eye vision loss: No  Right eye vision loss: No  Visual disturbances (i.e. blurred vision): Yes  Visual halos: No  Cough: No  Coughing blood: No  Shortness of breath: No  Snoring: No  Production of sputum: No  Wheezing: No  Changes in nail beds: No  Skin discoloration: Yes  Skin dryness: No  Skin flushing: Yes  Itching: Yes  Poor wound healing: Yes  Rash: No  Skin cancer: No  Lesions: No  Unusual hair distribution: No  Congestion: No  Ear discharge: No  Ear pain: No  Hearing loss: No  Hoarseness: No  Nosebleeds: No  Painful to  swallow: No  Sore throat: No  Stridor: No  Tinnitus (ringing in ear): No  Chest pain: Yes  Pain in your leg that occurs when you walk or exercise.: Yes  Bluish/grayish skin discoloration of mucous membranes: No  Unable to catch your breath during physical activity.: Yes  Irregular heartbeats: Yes  Leg swelling: No  Near-fainting: Yes  Shortness of breath while laying down. : No  Palpitations: Yes  Waking up in the night due to shortness of breath. : Yes  Fainting or loss of consciousness: Yes  Intolerance of cold : No  Intolerance of heat: No  Excessive thirst or drinking of fluids.: No  Excessive hunger: No  Excessive urination: No  Adenopathy (swollen lymph nodes): No  Bleeding: No  Prone to bruising and bleeding easily : No  Arthritis: No  Back pain: No  Falls: No  Gout: No  Joint pain: No  Joint swelling: No  Muscle cramps: Yes  Muscle weakness: Yes  Myalgia (muscle pain/soreness): No  Neck pain: No  Stiffness: No  Abdominal bloating: Yes  Abdominal pain: Yes  Anorexia: No  Changes in bowel habit: No  Bowel incontinence: No  Constipation: Yes  Diarrhea: No  Difficulty swallowing food or liquid: No  Excessive appetite: No  Flatus (gas): No  Heartburn: No  Vomiting blood : No  Blood in stool: No  Hemorrhoids: Yes  Jaundice: No  Black tarry stool: No  Nausea: Yes  Vomiting: No  Bladder incontinence: No  Reduced sex drive: No  Discomfort or painful urination: No  Flank pain: No  Frequent urination: No  Genital sore: No  Blood in urine: No  Difficulty starting or maintaining a urine stream: No  Incomplete emptying: No  Frequently waking up at night to urinate: No  Pelvic pain: No  Urgency to urinate: No  Inability to speak due to damaged vocal cords. : No  Brief paralysis: No  Difficulty concentrating: No  Disturbances in coordination: Yes  Daytime sleepiness: No  Dizziness: Yes  Weakness in specific regions of the body: No  Generalized weakness (weakness throughout the body) : Yes  Headaches: Yes  Light-headedness:  Yes  Loss of balance: No  Numbness: Yes  Tingling sensations: Yes  Seizures: No  Sensory changes: No  Tremors: No  Vertigo: No  Altered mental status : Yes  Depression: No  Hallucinations: No  Hypervigilance (state of increased alertness and anxiety) : Yes  Insomnia: No  Memory loss: Yes  Nervous or anxious: Yes  Substance abuse: No  Thoughts of suicide: No  Thoughts of violence: No  Environmental allergies: No  HIV exposure: No  Hives: No  Persistent infections: No

## 2023-05-24 NOTE — Patient Instructions (Signed)
 Follow up with Dr. Orlena in 6 months.    Recommendations:  1)  Aggressive life style modification, including increased oral fluid intake, and avoidance of caffeine and alcohol.  Consider additional salt supplementation.  2)  Precautionary measures with symptoms, including sitting or lying down when safe to do so  3)  No driving for minimum 3 months following last syncopal episode  4)  Consider thigh high Jobst compression stockings    Please take blood pressure every day for next 2 weeks.  Make sure blood pressure is at a different time each day.  It doesn't matter if blood pressure is taken before or after medications.  If blood pressure is consistently greater than 130/80 with current medication please notify the office via:  Telephone [phone]  Fax [phone]  Uploaded to my chart  Dropped of at front desk of office.

## 2023-05-29 ENCOUNTER — Ambulatory Visit: Admit: 2023-05-29 | Payer: PRIVATE HEALTH INSURANCE

## 2023-05-29 DIAGNOSIS — Z0000 Encounter for general adult medical examination without abnormal findings: Secondary | ICD-10-CM

## 2023-05-29 NOTE — Patient Instructions (Signed)
 1. Processed & Packaged Foods (Salt Bombs ??)  Cured meats (bacon, ham, prosciutto, pepperoni, salami) - Up to 1,500 mg per serving  Deli meats ([place], roast beef, bologna) - Around 1,000 mg per serving  Jerky (beef, [place], or fish) - Can exceed 1,800 mg per ounce  Canned soups & broths - 700-1,500 mg per cup  Instant noodles (Ramen, Cup Noodles, etc.) - Over 2,000 mg per package  Frozen meals (pizza, TV dinners, pot pies, etc.) - 1,200-2,500 mg per meal  2. Cheesy & Dairy Offenders ??  Processed cheese (American, Velveeta, cheese spreads) - 400-600 mg per slice  Parmesan cheese - Over 400 mg per tablespoon  Cottage cheese - 800-1,000 mg per cup  3. Restaurant & Fast Food (Hidden Salt Traps ????)  Fast food burgers - 1,000-2,000 mg per sandwich  Fried chicken (nuggets, tenders, etc.) - 800-1,500 mg per serving  [place] fries - 300-500 mg per serving (without extra salt)  Pizza (especially chain pizza) - 600-1,500 mg per slice  Sub sandwiches (Subway, Jimmy John's, etc.) - 1,200-3,000 mg per sandwich  Soy sauce (especially in takeout [place] food) - 800-1,000 mg per tablespoon ??  [place] wings - 1,500-3,000 mg per serving  4. Snacks & Condiments (Sneaky Sodium ??)  Pretzels - 500-1,200 mg per serving  Potato chips - 150-300 mg per handful (but who eats just a handful?)  Pickles & olives - 600-1,500 mg per serving  Ketchup - 150-200 mg per tablespoon  Soy sauce - 800-1,000 mg per tablespoon  Ranch dressing & Caesar dressing - 300-500 mg per 2 tablespoons  5. Bread & Baked Goods (Surprising Sources ??)  Bagels - 400-600 mg each  Biscuits - 800-1,500 mg per piece  Croissants - 400-700 mg per pastry  Store-bought bread & rolls - 150-300 mg per slice  Most Ridiculously Salty Food?  Salted fish (e.g., anchovies, salt cod, dried fish) - 2,000-3,000 mg per serving  Salted black beans or fermented bean paste - 1,500-2,500 mg per serving  Pickled and fermented veggies (kimchi, sauerkraut, pickled peppers, etc.) - Up to 1,500  mg per serving

## 2023-05-29 NOTE — Progress Notes (Signed)
 [facility] [place]  [facility] [place]  [address]  [name] [address]    Name:  David Jordan Date of Birth: [birth year] (38 y.o.)   MRN: [id number]    Date of Service:  05/29/2023   1:15 PM      Subjective   Chief Complaint:     Chief Complaint   Patient presents with    Annual Exam     Follow up on colonoscopy.        History of Present Illness:   Xavius Spadafore is a(n) 38 y.o. 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    He experiences ongoing blood pressure fluctuations, with both spikes and drops. Recently, his blood pressure has been more stable, with fewer episodes of lightheadedness or altered states in the past two to three weeks. He has increased his sodium intake by consuming more sodium-rich foods and drinks, such as Powerade, tortilla chips, and [place] food. He monitors his blood pressure and heart rate daily.    He describes persistent abdominal pain that is constant and not relieved by previous interventions. A colonoscopy revealed general hemorrhoids but no other significant findings. Despite consulting with multiple doctors, the cause of his abdominal pain remains unclear. It has been suggested that it may be related to blood vessel dilation and pressure from stool movement.    He experiences intermittent sharp chest pain that comes and goes without specific triggers. During these episodes, his heart rate can reach 130-140 bpm.    He experiences fatigue and a lack of energy, particularly in the evenings when his blood pressure tends to drop. When his blood pressure drops, he often ends up sitting on the couch for extended periods. He has been trying to increase his physical activity by walking more, but still experiences crashes in energy levels.    He has been off work for  several months due to his condition and wants to return. 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 He engages in activities at home, such as caring for animals and cleaning aquariums, to maintain some level of activity.        Patient Care Team:  Patient Care Team:  Venita Sheffield, PA as PCP - General (Family Medicine)      Since colonoscopy, is feeling ok. BP not fluctuating as much as previous. Told colo normal except for some internal hemorrhoids. Still some mild stomach pain, but with normal colonoscopy, told likely dilated blood vessels and stool putting pressure as it moves through. But nothing GI related to cause abdominal pain.     Been about 2-3 weeks since last episode of altered consciousness/awareness. Gave up on gluten-free diet. Still seeing Dr. Sandra Cockayne who recommended increased sodium. Drinking powerade and increasing sodium, and thus far doing well. Gets some rare intermittent chest pain - sharp when it comes, lasts a few minutes when it comes, then goes away. Abdominal pain still present.     Was told hospital was to give him to wear compression stockings, which he did not get. Daily BP and heart rate monitoring, and send to her every few days. Orders given for compression socks. Told to go to medical supply store.  Started recording his blood pressures, and making sure to record several times per day.     We have not received record from [facility].     Drinking 2-3 Powerade every single day. Eating a lot of tortilla chips. Trying to emphasize sodium where he can.Since last LOC was in November Dr. Sandra Cockayne felt comfortable releasing him to drive again.     At least 2-3 weeks since last episode of transient consciousness. When BP drops, he is sitting on the couch not doing anything, sometimes for the whole day, before he feels any better.     Vasodepressor syndrome or neurocardiogenic syncope.     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     Heavy lifting, sometimes things feeling very light, sometimes very heavy. This has been since the beginning.       ASCVD Risk: The ASCVD Risk score (Arnett DK, et al., 2019) failed to calculate for the following reasons:    The 2019 ASCVD risk score is only valid for ages 62 to 25             10/11/2022    11:20 AM 04/24/2023     8:20 PM   PHQ-2   Little interest or pleasure in doing things 0 2   Feeling down, depressed, or hopeless 0 1       PHQ-9 Scores:       04/24/2023     8:20 PM   PHQ Total Score   PHQ-9 Total Score 9        Patient-reported       GAD7 Scores:       10/14/2022    10:52 AM 12/24/2022    11:43 AM 04/24/2023     8:21 PM   GAD7Total Score   GAD-7 Total Score 5 9 6         Patient-reported          Patient Care Team:  Venita Sheffield, PA as PCP - General (Family Medicine)      BP Readings from Last 3 Encounters:   05/29/23 120/70   05/24/23 140/85   04/25/23 134/83       Wt Readings from Last 3 Encounters:   05/29/23 140 lb (63.5 kg)   05/24/23 139 lb (63 kg)   04/25/23 137 lb (62.1 kg)         Patient denies changes in medical history, surgical history, family history or social history in the interim since last physical.      There is no immunization history on file for this patient.    No results found for: HMCOLON, HMSIGMOIDOSC, HMPAP, HMMAMMO, HMDEXASCAN, HMDIABFOOTEX, HMDIABEYEEX          Health Maintenance Summary       Overdue - Immunization: DTaP/Tdap/Td (1 - Tdap) Never done      01/20/2020  Postponed until 01/19/2021 by Venita Sheffield, PA (Declined by patient - REPORTS HE GOT WITHIN THE PAST YEAR OR SO)              Overdue - Immunization: Hepatitis B (1 of 3 - 19+ 3-dose series) Never done      No completion, postpone, frequency change, or communication history exists for this topic.              Overdue - Comprehensive Physical Exam (Yearly) Overdue since 01/19/2021      01/20/2020   Done  Depression Screening (Yearly) Next due on 04/23/2024      04/24/2023  Registry Metric: Last PHQ-9    04/24/2023  Registry Metric: Last PHQ-2: Flowsheet Data    10/11/2022  SmartData: DEPRESSION SCREENING              Immunization: Pneumococcal (Series Information) Aged Out      No completion, postpone, frequency change, or communication history exists for this topic.              Discontinued - Hepatitis C Screening (MyChart)  Discontinued      10/22/2022  HCV Ab component of Hepatitis C Antibody    01/20/2020  Frequency changed to Never by Venita Sheffield, PA (Declined by patient)              Discontinued - HIV Screening  Discontinued      10/22/2022  HIV 1+2 Antibody/Antigen with Reflex    01/20/2020  Frequency changed to Never by Venita Sheffield, PA (Declined by patient)              Discontinued - Immunization: Influenza (MyChart)  Discontinued      01/20/2020  Frequency changed to Never by Venita Sheffield, PA (Declined by patient)                      Current Outpatient Medications:  Current Outpatient Medications   Medication Sig Dispense Refill    cholecalciferol, vitamin D3, 50 mcg (2,000 unit) Tab Take 2,000 Units by mouth daily.      cyanocobalamin (VITAMIN B-12) 1000 MCG tablet Take 1 tablet (1,000 mcg total) by mouth daily. 30 tablet 0    ibuprofen (MOTRIN) 200 MG tablet Take 1 tablet (200 mg total) by mouth every 6 hours as needed for Pain.      multivitamin (THERAGRAN) tablet Take 1 tablet by mouth daily.      polyethylene glycol (GLYCOLAX) 17 gram/dose powder Take 17 g by mouth daily. PATIENT IS TO FOLLOW THE COLO PREP INSTRUCTIONS according to the instructions outlined in your prep letter from [facility]. 255 g 0     No current facility-administered medications for this visit.         ROS:   Review of Systems  See HPI.       Objective:     Vitals:    05/29/23 1257   BP: 120/70   BP Location: Right upper arm   Patient Position: Sitting   BP Cuff Size: Large   Pulse: 88   Resp:  16   SpO2: 97%   Weight: 140 lb (63.5 kg)   Height: 5' 9 (1.753 m)     Body mass index is 20.67 kg/m.    Physical Exam    Physical Exam   Constitutional: He is oriented to person, place, and time. He appears well-developed and well-nourished. No distress.   HENT:   Head: Normocephalic and atraumatic.   Right Ear: External ear normal. TM normal, canal normal.   Left Ear: External ear normal. TM normal, canal normal.   Nose: Nose normal.   Mouth/Throat: Oropharynx is clear and moist. No oropharyngeal exudate.   Eyes: Conjunctivae and EOM are normal. Pupils are equal, round, and reactive to light. Right eye exhibits no discharge. Left eye exhibits no discharge. No scleral icterus.   Neck: Normal range of motion. Neck supple. No JVD present. No tracheal deviation present. No thyromegaly present.   Cardiovascular: Normal rate, regular rhythm, normal heart sounds and  intact distal pulses.  Exam reveals no gallop and no friction rub. No murmur heard.  Pulmonary/Chest: Effort normal and breath sounds normal. No stridor. No respiratory distress. He has no wheezes. He has no rales. He exhibits no tenderness.   Abdominal: Soft. He exhibits no distension and no mass. There is no tenderness. There is no rebound and no guarding.   Musculoskeletal: Normal range of motion. He exhibits no edema and no tenderness.   Lymphadenopathy: He has no cervical adenopathy.   Neurological: He is alert and oriented to person, place, and time. He has normal reflexes. He displays normal reflexes. No cranial nerve deficit. He exhibits normal muscle tone. Coordination normal.   Skin: Skin is warm and dry. No rash noted. He is not diaphoretic. No erythema. No pallor.   Psychiatric: He has a normal mood and affect. His behavior is normal. Judgment and thought content normal.              Assessment/Plan:   There are no diagnoses linked to this encounter.    No follow-ups on file.       Assessment & Plan  Neurocardiogenic Syncope  Experiencing  episodes of lightheadedness, altered mental status, and syncope, likely due to neurocardiogenic syncope. Symptoms include fluctuating blood pressure and heart rate, with episodes of hypotension and tachycardia. Increase sodium intake through diet with options like Powerade, chicken broth, ramen, pickles, beef jerky, and liquid IV. Wear 30-40 mmHg compression stockings. Continue daily monitoring of blood pressure and heart rate, sending logs to the cardiologist every few days. Encourage physical activity and educate on exercises such as muscle flexing to increase blood pressure. Follow up with cardiology for further management.    Hypertension  Blood pressure has been fluctuating, with recent improvements in stability. Increase sodium intake to help maintain consistent blood pressure levels. Continue daily monitoring of blood pressure. Follow up with primary care for ongoing management.    Abdominal Pain  Chronic abdominal pain persists despite a recent colonoscopy showing only general hemorrhoids. Pain likely related to blood vessel dilation and pressure from stool movement. Monitor symptoms and report any changes. Consider further evaluation if symptoms persist or worsen.    General Health Maintenance  Due for a physical examination and wellness check to meet insurance requirements. Update routine screenings and immunizations as needed. Perform physical examination, update wellness incentive documentation, and ensure cholesterol and other routine labs are up to date.    Follow-up  Follow up with cardiology for neurocardiogenic syncope management. Monitor and record blood pressure and heart rate daily, sending logs to the cardiologist every few days. Consider follow-up with GI if abdominal pain persists. Reassess ability to return to work after a trial period of increased activity and sodium intake. Evaluate driving safety at the end of the month.            No LOS data to display        1:15 PM       Venita Sheffield, PA

## 2023-06-14 NOTE — Telephone Encounter (Signed)
 I am following up on unclosed old encounters.  Can you look at this and if it is complete, sign the encounter. If not, please complete. Thank you.

## 2023-06-20 NOTE — Addendum Note (Signed)
 Addended by: Jerelene Redden on: 06/20/2023 12:52 PM     Modules accepted: Orders

## 2023-07-02 ENCOUNTER — Ambulatory Visit: Payer: PRIVATE HEALTH INSURANCE | Attending: Neurology

## 2023-07-02 NOTE — Telephone Encounter (Signed)
 Patient issue was covered on other encounter, and patient was scheduled for colonoscopy.

## 2023-07-02 NOTE — Telephone Encounter (Signed)
 I am following up on unclosed old encounters.  Can you look at this and if it is complete, sign the encounter. If not, please complete. Thank you.

## 2023-07-09 ENCOUNTER — Ambulatory Visit: Admit: 2023-07-09 | Discharge: 2023-07-13 | Payer: PRIVATE HEALTH INSURANCE | Attending: "Endocrinology

## 2023-07-09 DIAGNOSIS — R232 Flushing: Secondary | ICD-10-CM

## 2023-07-09 DIAGNOSIS — K5900 Constipation, unspecified: Secondary | ICD-10-CM

## 2023-07-09 NOTE — Patient Instructions (Addendum)
 Testing for some unusual conditions related to GI tract hormones. Also testing for porphyria.   Message me if any labs are abnormal  Follow up in [facility] in 6 months.

## 2023-07-09 NOTE — Progress Notes (Signed)
 Endocrinology Office Visit    Subjective:   David Jordan is a 38 y.o. male here to establish care.      First symptoms after eating dinner and passed out when he stood up.   Worked with neurology and work up for seizures was negative.   Blood pressure and heart rate spiking then dropping  Concerns about adrenal gland involvement without radiologic abnormalities.   Following with Cardiology - something similar to POTS?  Abdominal pain is consistent feature - like a needle poking into him - just not thinking about it helps? Looking at faraway objects. A component of meditation  Episodes can happen randomly, feels sick with nausea, has to rest. Passing out can happen as well.  No connection to eating, exercise, or posture  Told to take in more sodium, compression stockings and socks  Has gone back to work now and this seems to help.  Can have almost a full body chill, he does go very pale according to his wife.  Constipated ever since this started - working with GI. 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 seizure vs PNES per neuro, normal EEG    Syncope and collapse 10/2022       Surgical History  Past Surgical History:   Procedure Laterality Date    PR EEG PHYS/QHP 2-12 HR WITH VEEG  09/22/2022    WISDOM TOOTH EXTRACTION         Family History  Family History   Problem Relation Age of Onset    Diabetes Father     Heart disease Father     Cancer Paternal Uncle 48        Colorectal, as adult over 35. Still alive    Colon Cancer Paternal Uncle     Rectal Cancer Paternal Uncle     Colon Cancer  Paternal Uncle         unknown age    Ulcerative colitis Paternal Aunt     Colon Cancer Paternal Uncle 61    Kidney disease Neg Hx     Stroke Neg Hx        Social History  Social History     Socioeconomic History    Marital status: Married     Spouse name: Not on file    Number of children: Not on file    Years of education: Not on file    Highest education level: Not on file   Occupational History    Not on file   Tobacco Use    Smoking status: Never     Passive exposure: Never    Smokeless tobacco: Never   Vaping Use    Vaping status: Never Used   Substance and Sexual Activity    Alcohol use: Not Currently    Drug use: Never    Sexual activity: Yes     Partners: Female     Birth control/protection:  I.U.D.   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    Seat Belt Yes   Social History Narrative    Not on file     Social Drivers of Health     Financial Resource Strain: Not on file   Food Insecurity: No Food Insecurity (07/06/2023)    Hunger Vital Sign     Worried About Running Out of Food in the Last Year: Never true     Ran Out of Food in the Last Year: Never true   Transportation Needs: No Transportation Needs (10/21/2022)    PRAPARE - Therapist, art (Medical): No     Lack of Transportation (Non-Medical): No   Physical Activity: Not on file   Stress: Not on file   Social Connections: Not on file   Intimate Partner Violence: Not At Risk (03/05/2023)    Humiliation, Afraid, Rape, and Kick questionnaire     Fear of Current or Ex-Partner: No     Emotionally Abused: No     Physically Abused: No     Sexually Abused: No   Housing Stability: Low Risk  (10/21/2022)    Housing Stability Vital Sign     Unable to Pay for Housing in the Last Year: No     Number of Times Moved in the Last Year: 0     Homeless in the Last Year: No       Medications  Current Outpatient Medications   Medication Sig     cholecalciferol (vitamin D3) Take 2,000 Units by mouth daily.    cyanocobalamin Take 1 tablet (1,000 mcg total) by mouth daily.    ibuprofen Take 1 tablet (200 mg total) by mouth every 6 hours as needed for Pain.    multivitamin Take 1 tablet by mouth daily.    polyethylene glycol Take 17 g by mouth daily. PATIENT IS TO FOLLOW THE [facility] PREP INSTRUCTIONS according to the instructions outlined in your prep letter from [facility]. (Patient not taking: Reported on 07/06/2023)     No current facility-administered medications for this visit.       Review of Systems  ROS    Objective:   BP 128/82 (Patient Position: Sitting)   Pulse 79   Ht 5' 9 (1.753 m)   Wt 141 lb (64 kg)   BMI 20.82 kg/m     General:  Conversant, no signs of distress, able to give detailed medical history, appears thin  HENT: No thyromegaly without cervical lymphadenopathy, no acanthosis nigricans, no dorsocervical fat pad, no proptosis  CV: Regular rate and rhythm, no murmurs  Lungs: No respiratory distress  Abd: Non-distended, non-tender, no striae  EXT: No edema, skin intact  Neuro: No focal neurologic deficits, normal gait, no tremors  Skin: no obvious rashes or lesions.    Assessment & Plan:      Problem List Items Addressed This Visit          Cardiovascular and Mediastinum    Postural dizziness with presyncope       Other    Transient alteration of awareness     Other Visit Diagnoses       Constipation, unspecified constipation type    -  Primary    Relevant Orders    Porphyrin, serum total    Porphyrins, urine, fractionated  Calcitonin    Vasoactive Intestinal Polypeptide (Vip), Plasma    Gastrin    Somatostatin    Acetylcholine Receptor Ab Profile        David Jordan was seen in my office to establish care.    He has been experiencing about 6 months of somewhat sudden onset symptoms which started with syncopal episode after eating and standing up.  Concurrently he has been experiencing difficult to explain abdominal pain and constipation.   There is also been a component of heart rate and blood pressure instability and he has undergone workup by multiple specialists at this point.  One thought was that he could be having pheochromocytoma and hence he was referred to endocrinology.  Since then, detailed testing has essentially ruled out pheochromocytoma or paraganglioma.  Imaging has been very normal of adrenal gland and pancreas.    We agreed to test for some unusual hormone abnormalities which are admittedly quite rare, and some of which do not completely fit the situation, especially considering that he is having constipation and not diarrhea, and he does not truly have flushing with a rash, if anything he becomes quite pale.  He also had detailed testing around aldosterone which does not look to be abnormal.  We discussed that sometimes fludrocortisone (aldosterone) is used to treat hemodynamic instability even if there is not aldosterone deficiency.    These labs will need to be done while fasting.    We did consider hypoglycemia for some short time but none of his symptoms really fit well with this.    Recommendations:  Testing for some unusual conditions related to GI tract hormones. Also testing for porphyria.   Message me if any labs are abnormal  Follow up in [facility] in 6 months.     Orders Placed  Orders Placed This Encounter   Procedures    Porphyrin, serum total    Porphyrins, urine, fractionated    Calcitonin    Vasoactive Intestinal Polypeptide (Vip), Plasma    Gastrin    Somatostatin    Acetylcholine Receptor Ab Profile     Modified Medications    No medications on file     New Prescriptions    No medications on file         Aleda Grana DO

## 2023-07-16 ENCOUNTER — Ambulatory Visit: Payer: PRIVATE HEALTH INSURANCE | Attending: "Endocrinology

## 2023-07-18 ENCOUNTER — Other Ambulatory Visit: Admit: 2023-07-18 | Payer: PRIVATE HEALTH INSURANCE

## 2023-07-18 DIAGNOSIS — K5900 Constipation, unspecified: Secondary | ICD-10-CM

## 2023-07-18 LAB — PORPHYRINS, URINE, FRACTIONATED
Coproporphyrin I, UR: 11 ug/L (ref 0–15)
Coproporphyrin III, UR: 28 ug/L (ref 0–49)
Heptacarboxyporphyrin, UR: 3 ug/L — ABNORMAL HIGH (ref 0–2)
Hexacarboxyporphyrin, UR: <1 ug/L (ref 0–1)
Pentacarboxylporph,UR: <1 ug/L (ref 0–2)
Uroporphyrin, UR: 6 ug/L (ref 0–20)

## 2023-07-18 LAB — GASTRIN: Gastrin: 28 pg/mL (ref 0–115)

## 2023-07-18 LAB — MAYO MISCELLANIOUS REFERENCE TEST

## 2023-07-18 LAB — CALCITONIN: Calcitonin: 2.9 pg/mL (ref 0.0–8.4)

## 2023-07-18 LAB — PORPHYRIN, TOTAL: Porphyrin Total: <0.1 ug/dL (ref 0.0–1.0)

## 2023-07-18 LAB — ACETYLCHOLINE RECEPTOR AB PROFILE
AChR Modulating Ab: 0 % (ref 0–45)
Acetylcholine Receptor Binding Antibody: <0.07 nmol/L (ref 0.00–0.24)
Acetylcholine Receptor Blocking Antibody: 13 % (ref 0–25)

## 2023-07-18 LAB — SOMATOSTATIN: Somatostatin: 27 pg/mL (ref <=30)

## 2023-08-31 ENCOUNTER — Encounter: Admit: 2023-08-31 | Payer: PRIVATE HEALTH INSURANCE

## 2023-09-06 ENCOUNTER — Other Ambulatory Visit: Admit: 2023-09-06 | Payer: PRIVATE HEALTH INSURANCE

## 2023-09-06 DIAGNOSIS — R8290 Unspecified abnormal findings in urine: Secondary | ICD-10-CM

## 2023-09-06 LAB — URINALYSIS W/RFL TO MICROSCOPIC
Bilirubin, UA: NEGATIVE
Blood, UA: NEGATIVE
Glucose, UA: NEGATIVE mg/dL
Ketones, UA: NEGATIVE mg/dL
Leukocyte Esterase, UA: NEGATIVE
Nitrite, UA: NEGATIVE
Protein, UA: NEGATIVE mg/dL
Specific Gravity, UA: 1.012 (ref 1.005–1.035)
Urobilinogen, UA: <2 mg/dL (ref 0.2–1.9)
pH, UA: 6 (ref 5.0–8.0)

## 2023-09-06 LAB — URINE CULTURE, OUTPATIENT: Culture Result: NO GROWTH

## 2023-09-17 ENCOUNTER — Other Ambulatory Visit: Admit: 2023-09-17 | Payer: PRIVATE HEALTH INSURANCE

## 2023-09-17 DIAGNOSIS — R232 Flushing: Secondary | ICD-10-CM

## 2023-09-17 LAB — VASOACTIVE INTESTINAL POLYPEPTIDE (VIP), PLASMA: Vasoactive Intestinal Polypeptide (Vip), Plasma: 103 pg/mL — ABNORMAL HIGH (ref <86)

## 2023-09-17 LAB — CHROMOGRANIN A: Chromogranin A: 54.3 ng/mL (ref 0.0–101.8)

## 2023-11-11 NOTE — Other (Signed)
 This is a notification of a Admission Alert generated from an ADT received from [employer]. This patient was admited un:[facility] Admit Date:11/11/2023 0751 P.M. Discharge Date: Visit Type:EMERGENCY Diagnosis:Animal Bite dog bite

## 2023-11-11 NOTE — Other (Signed)
 This is a notification of a Discharge Alert generated from an ADT received from [employer]. This patient was admited un:[facility] Admit Date:11/11/2023 0751 P.M. Discharge Date:11/11/2023 0938 P.M. Visit Type:EMERGENCY Diagnosis:Bitten by dog,   initial encounter Laceration without foreign body of left upper arm, initial encounter Animal Bite dog bite

## 2023-11-22 ENCOUNTER — Ambulatory Visit: Payer: PRIVATE HEALTH INSURANCE

## 2024-01-09 ENCOUNTER — Encounter: Attending: "Endocrinology
